# Patient Record
Sex: Male | Born: 1945
Health system: Southern US, Community
[De-identification: ages and names within clinical notes are randomized; demographics above are authoritative.]

## PROBLEM LIST (undated history)

## (undated) DIAGNOSIS — E785 Hyperlipidemia, unspecified: Secondary | ICD-10-CM

## (undated) DIAGNOSIS — I10 Essential (primary) hypertension: Secondary | ICD-10-CM

## (undated) DIAGNOSIS — G4730 Sleep apnea, unspecified: Secondary | ICD-10-CM

## (undated) DIAGNOSIS — E119 Type 2 diabetes mellitus without complications: Secondary | ICD-10-CM

## (undated) DIAGNOSIS — C801 Malignant (primary) neoplasm, unspecified: Secondary | ICD-10-CM

## (undated) DIAGNOSIS — K219 Gastro-esophageal reflux disease without esophagitis: Secondary | ICD-10-CM

## (undated) HISTORY — DX: Type 2 diabetes mellitus without complications: E11.9

## (undated) HISTORY — PX: TONSILLECTOMY: SUR1361

## (undated) HISTORY — DX: Gastro-esophageal reflux disease without esophagitis: K21.9

## (undated) HISTORY — DX: Essential (primary) hypertension: I10

## (undated) HISTORY — PX: OTHER SURGICAL HISTORY: SHX169

## (undated) HISTORY — PX: SHOULDER SURGERY: SHX246

## (undated) HISTORY — DX: Sleep apnea, unspecified: G47.30

## (undated) HISTORY — DX: Malignant (primary) neoplasm, unspecified: C80.1

## (undated) HISTORY — DX: Hyperlipidemia, unspecified: E78.5

---

## 2005-01-07 ENCOUNTER — Ambulatory Visit: Payer: Self-pay | Admitting: Internal Medicine

## 2005-02-18 ENCOUNTER — Ambulatory Visit: Payer: Self-pay | Admitting: Internal Medicine

## 2009-09-28 ENCOUNTER — Ambulatory Visit: Payer: Self-pay | Admitting: Radiology

## 2009-09-28 ENCOUNTER — Emergency Department (HOSPITAL_BASED_OUTPATIENT_CLINIC_OR_DEPARTMENT_OTHER): Admission: EM | Admit: 2009-09-28 | Discharge: 2009-09-29 | Payer: Self-pay | Admitting: Emergency Medicine

## 2010-07-28 LAB — BASIC METABOLIC PANEL
BUN: 19 mg/dL (ref 6–23)
Chloride: 105 mEq/L (ref 96–112)
GFR calc Af Amer: >60 mL/min (ref >60)
GFR calc non Af Amer: >60 mL/min (ref >60)
Glucose, Bld: 174 mg/dL — ABNORMAL HIGH (ref 70–99)

## 2010-07-28 LAB — CBC
HCT: 43.8 % (ref 39.0–52.0)
Hemoglobin: 14.9 g/dL (ref 13.0–17.0)
Platelets: 152 10*3/uL (ref 150–400)
RBC: 4.74 MIL/uL (ref 4.22–5.81)
RDW: 13 % (ref 11.5–15.5)

## 2010-07-28 LAB — DIFFERENTIAL
Basophils Relative: 1 % (ref 0–1)
Eosinophils Absolute: 0.2 10*3/uL (ref 0.0–0.7)
Lymphocytes Relative: 24 % (ref 12–46)
Neutro Abs: 5.2 10*3/uL (ref 1.7–7.7)
Neutrophils Relative %: 66 % (ref 43–77)

## 2013-05-11 LAB — HM COLONOSCOPY

## 2014-05-29 ENCOUNTER — Other Ambulatory Visit: Payer: Self-pay | Admitting: *Deleted

## 2014-05-29 ENCOUNTER — Encounter: Payer: Self-pay | Admitting: Vascular Surgery

## 2014-05-29 DIAGNOSIS — M79606 Pain in leg, unspecified: Secondary | ICD-10-CM

## 2014-05-30 ENCOUNTER — Encounter: Payer: Self-pay | Admitting: Vascular Surgery

## 2014-05-31 ENCOUNTER — Encounter: Payer: Self-pay | Admitting: Vascular Surgery

## 2014-05-31 ENCOUNTER — Ambulatory Visit (INDEPENDENT_AMBULATORY_CARE_PROVIDER_SITE_OTHER): Payer: Medicare HMO | Admitting: Vascular Surgery

## 2014-05-31 ENCOUNTER — Ambulatory Visit (HOSPITAL_COMMUNITY)
Admission: RE | Admit: 2014-05-31 | Discharge: 2014-05-31 | Disposition: A | Discharge status: Home / Self Care | Payer: Medicare HMO | Source: Ambulatory Visit | Attending: Vascular Surgery | Admitting: Vascular Surgery

## 2014-05-31 VITALS — BP 136/84 | HR 101 | Resp 16 | Ht >= 80 in | Wt 292.0 lb

## 2014-05-31 DIAGNOSIS — M79606 Pain in leg, unspecified: Secondary | ICD-10-CM

## 2014-05-31 DIAGNOSIS — I872 Venous insufficiency (chronic) (peripheral): Secondary | ICD-10-CM | POA: Insufficient documentation

## 2014-05-31 NOTE — Progress Notes (Signed)
Referred by:  Harvie Junior, MD [address] [place], [place] [address]  Reason for referral: B calf cramping  History of Present Illness  Ronald Barr is a 69 y.o. (1946-01-15) male who presents with chief complaint: B calf cramping.  Patient notes, onset of nocturnal calf cramping years ago.  He denies any associated sx routinely.  He has previously had cramping with laying flat and sitting at a desk.   He denies any leg swelling, intermittent claudication or rest pain.  He does not episodes with also anterior shin burning in the L lower leg.  The patient has had no history of DVT, known history of varicose vein, no history of venous stasis ulcers, no history of  Lymphedema and no history of skin changes in lower legs.  There is family history of venous disorders.  The patient has used OTC compression stockings in the past.  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  Past Medical History  Diagnosis Date  . Diabetes mellitus without complication   . GERD (gastroesophageal reflux disease)   . Hypertension   . Hyperlipidemia   . Cancer     prostate   Family History: denies any active medical issues in parents  History   Social History  . Marital Status: Married    Spouse Name: N/A    Number of Children: N/A  . Years of Education: N/A   Occupational History  . Not on file.   Social History Main Topics  . Smoking status: Former Smoker    Quit date: 06/01/1979  . Smokeless tobacco: Never Used  . Alcohol Use: 0.0 oz/week    0 Not specified per week  . Drug Use: No  . Sexual Activity: Not on file   Other Topics Concern  . Not on file   Social History Narrative    Family History  Problem Relation Age of Onset  . Hypertension Mother   . Hypertension Father     Current Outpatient Prescriptions  Medication Sig Dispense Refill  . aspirin 81 MG tablet Take 81 mg by mouth daily.    Marland Kitchen CINNAMON PO Take by mouth.    . co-enzyme Q-10 30 MG capsule  Take 30 mg by mouth 3 (three) times daily.    Marland Kitchen KRILL OIL PO Take by mouth.    Marland Kitchen LISINOPRIL PO Take by mouth daily.    Marland Kitchen LOSARTAN POTASSIUM PO Take by mouth daily.    Marland Kitchen METFORMIN HCL PO Take by mouth 3 (three) times daily.    Marland Kitchen OMEPRAZOLE PO Take by mouth daily.     No current facility-administered medications for this visit.    Allergies  Allergen Reactions  . Sulfa Antibiotics     REVIEW OF SYSTEMS:  (Positives checked otherwise negative)  CARDIOVASCULAR:  []  chest pain, []  chest pressure, []  palpitations, []  shortness of breath when laying flat, []  shortness of breath with exertion,  []  pain in feet when walking, []  pain in feet when laying flat, []  history of blood clot in veins (DVT), []  history of phlebitis, []  swelling in legs, []  varicose veins  PULMONARY:  []  productive cough, []  asthma, []  wheezing  NEUROLOGIC:  []  weakness in arms or legs, []  numbness in arms or legs, []  difficulty speaking or slurred speech, []  temporary loss of vision in one eye, []  dizziness  HEMATOLOGIC:  []  bleeding problems, []  problems with blood clotting too easily  MUSCULOSKEL:  []  joint pain, []  joint swelling, [x]   bilateral calf cramping  GASTROINTEST:  []  vomiting blood, []  blood in stool     GENITOURINARY:  []  burning with urination, []  blood in urine  PSYCHIATRIC:  []  history of major depression  INTEGUMENTARY:  []  rashes, []  ulcers  CONSTITUTIONAL:  []  fever, []  chills   Physical Examination Filed Vitals:   05/31/14 1337  BP: 136/84  Pulse: 101  Resp: 16  Height: 6\' 8"  (2.032 m)  Weight: 292 lb (132.45 kg)   Body mass index is 32.08 kg/(m^2).  General: A&O x 3, WDWN  Head: Centre/AT  Ear/Nose/Throat: Hearing grossly intact, nares w/o erythema or drainage, oropharynx w Erythema w/oExudate  Eyes: PERRLA, EOMI  Neck: Supple, no nuchal rigidity, no palpable LAD, mild submandibular gland enlargement  Pulmonary: Sym exp, good air movt, CTAB, no rales, rhonchi, &  wheezing  Cardiac: RRR, Nl S1, S2, no Murmurs, rubs or gallops  Vascular: Vessel Right Left  Radial Palpable Palpable  Brachial Palpable Palpable  Carotid Palpable, without bruit Palpable, without bruit  Aorta Not palpable N/A  Femoral Palpable Palpable  Popliteal Not palpable Not palpable  PT Not Palpable Not Palpable  DP Faintly Palpable Palpable   Gastrointestinal: soft, NTND, -G/R, - HSM, - masses, - CVAT B  Musculoskeletal: M/S 5/5 throughout , Extremities without ischemic changes , spider vein and small varicosities B, no edema B, no LDS  Neurologic: CN 2-12 intact , Pain and light touch intact in extremities , Motor exam as listed above  Psychiatric: Judgment intact, Mood & affect appropriate for pt's clinical situation  Dermatologic: See M/S exam for extremity exam, no rashes otherwise noted  Lymph : No Cervical, Axillary, or Inguinal lymphadenopathy    Non-Invasive Vascular Imaging  BLE Venous Insufficiency Duplex (Date: 05/31/2014):   RLE: no  DVT and SVT, + GSV reflux, + deep venous reflux: CFV  LLE: no DVT and SVT, + GSV reflux: minimal, + deep venous reflux: CFV, FV  Medical Decision Making  Ronald Barr is a 69 y.o. male who presents with: BLE chronic venous insufficiency (C2), nocturnal calf cramping   Pt's sx are not consistent with an arterial or venous etiology.  Based on the patient's history and examination, I recommend: OTC compressive therapy.  I discussed with the patient the use of her 20-30 mm thigh high compression stockings.  I doubt that this patient would benefit for EVLA of R GSV as his sx are not c/w with CVI.  Thank you for allowing [place] to participate in this patient's care.  Adele Barthel, MD [facility] Office: [phone] Pager: [phone]  05/31/2014, 2:00 PM

## 2014-07-10 DIAGNOSIS — M1712 Unilateral primary osteoarthritis, left knee: Secondary | ICD-10-CM | POA: Insufficient documentation

## 2014-07-10 DIAGNOSIS — M7062 Trochanteric bursitis, left hip: Secondary | ICD-10-CM | POA: Insufficient documentation

## 2015-03-14 DIAGNOSIS — H524 Presbyopia: Secondary | ICD-10-CM | POA: Diagnosis not present

## 2015-05-22 DIAGNOSIS — H43813 Vitreous degeneration, bilateral: Secondary | ICD-10-CM | POA: Diagnosis not present

## 2015-05-22 DIAGNOSIS — H35341 Macular cyst, hole, or pseudohole, right eye: Secondary | ICD-10-CM | POA: Diagnosis not present

## 2015-06-05 DIAGNOSIS — H47322 Drusen of optic disc, left eye: Secondary | ICD-10-CM | POA: Diagnosis not present

## 2015-06-05 DIAGNOSIS — H35373 Puckering of macula, bilateral: Secondary | ICD-10-CM | POA: Diagnosis not present

## 2015-06-05 DIAGNOSIS — H43811 Vitreous degeneration, right eye: Secondary | ICD-10-CM | POA: Diagnosis not present

## 2015-06-05 DIAGNOSIS — H47321 Drusen of optic disc, right eye: Secondary | ICD-10-CM | POA: Diagnosis not present

## 2015-06-05 DIAGNOSIS — H43311 Vitreous membranes and strands, right eye: Secondary | ICD-10-CM | POA: Diagnosis not present

## 2015-06-05 DIAGNOSIS — H25813 Combined forms of age-related cataract, bilateral: Secondary | ICD-10-CM | POA: Diagnosis not present

## 2015-07-12 DIAGNOSIS — Z23 Encounter for immunization: Secondary | ICD-10-CM | POA: Diagnosis not present

## 2015-07-12 DIAGNOSIS — N400 Enlarged prostate without lower urinary tract symptoms: Secondary | ICD-10-CM | POA: Diagnosis not present

## 2015-07-12 DIAGNOSIS — E785 Hyperlipidemia, unspecified: Secondary | ICD-10-CM | POA: Diagnosis not present

## 2015-07-12 DIAGNOSIS — R5383 Other fatigue: Secondary | ICD-10-CM | POA: Diagnosis not present

## 2015-07-12 DIAGNOSIS — E119 Type 2 diabetes mellitus without complications: Secondary | ICD-10-CM | POA: Diagnosis not present

## 2015-07-12 DIAGNOSIS — I10 Essential (primary) hypertension: Secondary | ICD-10-CM | POA: Diagnosis not present

## 2015-07-12 DIAGNOSIS — E669 Obesity, unspecified: Secondary | ICD-10-CM | POA: Diagnosis not present

## 2015-11-01 DIAGNOSIS — E119 Type 2 diabetes mellitus without complications: Secondary | ICD-10-CM | POA: Diagnosis not present

## 2016-03-06 ENCOUNTER — Other Ambulatory Visit: Payer: Self-pay

## 2016-03-06 ENCOUNTER — Other Ambulatory Visit: Payer: Self-pay | Admitting: Family Medicine

## 2016-03-06 ENCOUNTER — Ambulatory Visit (INDEPENDENT_AMBULATORY_CARE_PROVIDER_SITE_OTHER): Payer: Medicare HMO | Admitting: Family Medicine

## 2016-03-06 ENCOUNTER — Encounter: Payer: Self-pay | Admitting: Family Medicine

## 2016-03-06 VITALS — BP 100/76 | HR 81 | Temp 97.5°F | Ht >= 80 in | Wt 281.6 lb

## 2016-03-06 DIAGNOSIS — I10 Essential (primary) hypertension: Secondary | ICD-10-CM

## 2016-03-06 DIAGNOSIS — K219 Gastro-esophageal reflux disease without esophagitis: Secondary | ICD-10-CM | POA: Diagnosis not present

## 2016-03-06 DIAGNOSIS — Z1159 Encounter for screening for other viral diseases: Secondary | ICD-10-CM | POA: Diagnosis not present

## 2016-03-06 DIAGNOSIS — E119 Type 2 diabetes mellitus without complications: Secondary | ICD-10-CM | POA: Diagnosis not present

## 2016-03-06 DIAGNOSIS — N179 Acute kidney failure, unspecified: Secondary | ICD-10-CM

## 2016-03-06 DIAGNOSIS — Z23 Encounter for immunization: Secondary | ICD-10-CM

## 2016-03-06 HISTORY — DX: Essential (primary) hypertension: I10

## 2016-03-06 LAB — COMPREHENSIVE METABOLIC PANEL
ALK PHOS: 28 U/L — AB (ref 39–117)
ALT: 28 U/L (ref 0–53)
AST: 26 U/L (ref 0–37)
Albumin: 4.4 g/dL (ref 3.5–5.2)
BILIRUBIN TOTAL: 0.5 mg/dL (ref 0.2–1.2)
BUN: 42 mg/dL — ABNORMAL HIGH (ref 6–23)
CALCIUM: 10 mg/dL (ref 8.4–10.5)
CO2: 26 mEq/L (ref 19–32)
Chloride: 104 mEq/L (ref 96–112)
Creatinine, Ser: 2.29 mg/dL — ABNORMAL HIGH (ref 0.40–1.50)
GFR: 30.26 mL/min — AB (ref >60.00)
GLUCOSE: 180 mg/dL — AB (ref 70–99)
POTASSIUM: 4.9 meq/L (ref 3.5–5.1)
Sodium: 137 mEq/L (ref 135–145)
TOTAL PROTEIN: 7.5 g/dL (ref 6.0–8.3)

## 2016-03-06 LAB — LIPID PANEL
CHOL/HDL RATIO: 5
Cholesterol: 204 mg/dL — ABNORMAL HIGH (ref 0–200)
HDL: 38.8 mg/dL — AB (ref >39.00)
LDL Cholesterol: 142 mg/dL — ABNORMAL HIGH (ref 0–99)
NONHDL: 165.15
TRIGLYCERIDES: 118 mg/dL (ref 0.0–149.0)
VLDL: 23.6 mg/dL (ref 0.0–40.0)

## 2016-03-06 LAB — HEPATITIS C ANTIBODY: HCV AB: NEGATIVE

## 2016-03-06 LAB — HEMOGLOBIN A1C: Hgb A1c MFr Bld: 8.1 % — ABNORMAL HIGH (ref 4.6–6.5)

## 2016-03-06 MED ORDER — INVOKANA 100 MG PO TABS: 100.0000 mg | ORAL_TABLET | Freq: Every day | ORAL | 1 refills | Status: DC

## 2016-03-06 MED ORDER — LISINOPRIL-HYDROCHLOROTHIAZIDE 20-25 MG PO TABS: 1.0000 | ORAL_TABLET | Freq: Every day | ORAL | 1 refills | Status: DC

## 2016-03-06 MED ORDER — FENOFIBRATE 145 MG PO TABS: 145.0000 mg | ORAL_TABLET | Freq: Every day | ORAL | 1 refills | Status: DC

## 2016-03-06 MED ORDER — METFORMIN HCL 850 MG PO TABS: 850.0000 mg | ORAL_TABLET | Freq: Three times a day (TID) | ORAL | 1 refills | Status: DC

## 2016-03-06 MED ORDER — OMEPRAZOLE 20 MG PO CPDR: 20.0000 mg | DELAYED_RELEASE_CAPSULE | Freq: Every day | ORAL | 1 refills | Status: DC

## 2016-03-06 NOTE — Addendum Note (Signed)
Addended by: [place] on: 03/06/2016 09:23 AM   Modules accepted: Orders

## 2016-03-06 NOTE — Patient Instructions (Addendum)
Stop taking the Losartan and Glipizide.  Try Pepcid or Zantac instead of your Omeprazole. If your symptoms return, go back to the omeprazole.

## 2016-03-06 NOTE — Progress Notes (Signed)
Pre visit review using our clinic review tool, if applicable. No additional management support is needed unless otherwise documented below in the visit note. 

## 2016-03-06 NOTE — Progress Notes (Signed)
Chief Complaint  Patient presents with  . Establish Care    Rx refilled PCP has closed office with no warning        New Patient Visit SUBJECTIVE: HPI: Ronald Barr is an 70 y.o.male who is being seen for establishing care.  The patient was previously seen at [facility], [facility], whose office closed down without warning.  [facility] Has had both PCV's Checks sugars 2 times weekly. Does have intermittent low sugar levels. 1x/2 weeks. Does not require insulin. He is on Metformin, Glucotrol, and Invokana. He does take a baby asa daily. He is taking both Prinzide and Losartan. He tried multiple statins in the past, but was intolerant 2/2 muscle aches. He is now on Tricor. Nml microalbumin/Cr 6 mo ago, when he had his other routine labs done.  He does check his feet routinely and has no issues. Flu shot today.  Allergies  Allergen Reactions  . Sulfa Antibiotics     Past Medical History:  Diagnosis Date  . Cancer [facility])    prostate  . Diabetes mellitus without complication ([place])   . Essential hypertension 03/06/2016  . GERD (gastroesophageal reflux disease)   . Hyperlipidemia   . Hypertension   . Sleep apnea    sleeps with BPAP machine   Past Surgical History:  Procedure Laterality Date  . nose and throat surgery     sleep apnes  . SHOULDER SURGERY    . TONSILLECTOMY     Social History   Social History  . Marital status: Married   Social History Main Topics  . Smoking status: Former Smoker    Quit date: 06/01/1979  . Smokeless tobacco: Never Used  . Alcohol use 0.0 oz/week  . Drug use: No   Family History  Problem Relation Age of Onset  . Hypertension Mother   . Hypertension Father      Current Outpatient Prescriptions:  .  aspirin 81 MG tablet, Take 81 mg by mouth daily., Disp: , Rfl:  .  CINNAMON PO, Take 500 mg by mouth 2 (two) times daily. , Disp: , Rfl:  .  diphenhydrAMINE (BENADRYL) 25 MG tablet, Take 25 mg by  mouth every 6 (six) hours as needed., Disp: , Rfl:  .  fenofibrate (TRICOR) 145 MG tablet, Take 1 tablet (145 mg total) by mouth daily., Disp: 90 tablet, Rfl: 1 .  INVOKANA 100 MG TABS tablet, Take 1 tablet (100 mg total) by mouth daily before breakfast., Disp: 90 tablet, Rfl: 1 .  KRILL OIL PO, Take by mouth., Disp: , Rfl:  .  magnesium gluconate (MAGONATE) 500 MG tablet, Take 500 mg by mouth 2 (two) times daily., Disp: , Rfl:  .  metFORMIN (GLUCOPHAGE) 850 MG tablet, Take 1 tablet (850 mg total) by mouth 3 (three) times daily., Disp: 270 tablet, Rfl: 1 .  omeprazole (PRILOSEC) 20 MG capsule, Take 1 capsule (20 mg total) by mouth daily., Disp: 90 capsule, Rfl: 1 .  Potassium 99 MG TABS, Take 99 mg by mouth 2 (two) times daily., Disp: , Rfl:  .  co-enzyme Q-10 30 MG capsule, Take 30 mg by mouth 3 (three) times daily., Disp: , Rfl:  .  lisinopril-hydrochlorothiazide (PRINZIDE,ZESTORETIC) 20-25 MG tablet, Take 1 tablet by mouth daily., Disp: 90 tablet, Rfl: 1  ROS Cardiovascular: Denies chest pain  Respiratory: Denies dyspnea   OBJECTIVE: BP 100/76 (BP Location: Left Arm, Patient Position: Sitting, Cuff Size: Large)   Pulse 81  Temp 97.5 F (36.4 C) (Oral)   Ht 6\' 8"  (2.032 m)   Wt 281 lb 9.6 oz (127.7 kg)   SpO2 95%   BMI 30.94 kg/m   Constitutional: -  VS reviewed -  Well developed, well nourished, appears stated age -  No apparent distress  Psychiatric: -  Oriented to person, place, and time -  Memory intact -  Affect and mood normal -  Fluent conversation, good eye contact -  Judgment and insight age appropriate  Eye: -  Conjunctivae clear, no discharge -  Pupils symmetric, round, reactive to light  ENMT: -  Oral mucosa without lesions, tongue and uvula midline    Tonsils not enlarged, no erythema, no exudate, trachea midline    Pharynx moist, no lesions, no erythema  Neck: -  No gross swelling, no palpable masses -  Thyroid midline, not enlarged, mobile, no palpable masses   Cardiovascular: -  RRR, no murmurs -  No LE edema  Respiratory: -  Normal respiratory effort, no accessory muscle use, no retraction -  Breath sounds equal, no wheezes, no ronchi, no crackles  Gastrointestinal: -  Bowel sounds normal -  No tenderness, no distention, no guarding, no masses  Musculoskeletal: -  No clubbing, no cyanosis -  Gait normal  Skin: -  No significant lesion on inspection -  Warm and dry to palpation   ASSESSMENT/PLAN: Type 2 diabetes mellitus without complication, without long-term current use of insulin (HCC) - Plan: INVOKANA 100 MG TABS tablet, fenofibrate (TRICOR) 145 MG tablet, metFORMIN (GLUCOPHAGE) 850 MG tablet, lisinopril-hydrochlorothiazide (PRINZIDE,ZESTORETIC) 20-25 MG tablet, Lipid panel, Hemoglobin A1c, CANCELED: Microalbumin / creatinine urine ratio  Essential hypertension - Plan: lisinopril-hydrochlorothiazide (PRINZIDE,ZESTORETIC) 20-25 MG tablet, Comprehensive metabolic panel  Gastroesophageal reflux disease, esophagitis presence not specified - Plan: omeprazole (PRILOSEC) 20 MG capsule  Need for hepatitis C screening test - Plan: Hepatitis C Antibody  Patient instructed to sign release of records form from his previous PCP. Stop Losartan as he is on an ACEi. Stop Glucotrol given his episodes of hypoglycemia and age. Will trial off of omeprazole, recommended Pepcid or Zantac. If too  Patient should return in 3 mo to recheck DMII. If his A1c is controlled, will space out to every 6 mo. The patient voiced understanding and agreement to the plan.   [place], DO 03/06/16  9:06 AM

## 2016-03-12 ENCOUNTER — Other Ambulatory Visit (INDEPENDENT_AMBULATORY_CARE_PROVIDER_SITE_OTHER): Payer: Medicare HMO

## 2016-03-12 DIAGNOSIS — N179 Acute kidney failure, unspecified: Secondary | ICD-10-CM

## 2016-03-12 LAB — BASIC METABOLIC PANEL
BUN: 43 mg/dL — AB (ref 6–23)
CO2: 25 meq/L (ref 19–32)
Calcium: 10.1 mg/dL (ref 8.4–10.5)
Chloride: 100 mEq/L (ref 96–112)
Creatinine, Ser: 2.24 mg/dL — ABNORMAL HIGH (ref 0.40–1.50)
GFR: 31.04 mL/min — ABNORMAL LOW (ref >60.00)
GLUCOSE: 212 mg/dL — AB (ref 70–99)
POTASSIUM: 5.4 meq/L — AB (ref 3.5–5.1)
SODIUM: 133 meq/L — AB (ref 135–145)

## 2016-03-13 ENCOUNTER — Telehealth: Payer: Self-pay | Admitting: Family

## 2016-03-13 MED ORDER — AMLODIPINE BESYLATE 5 MG PO TABS: 5.0000 mg | ORAL_TABLET | Freq: Every day | ORAL | 2 refills | Status: DC

## 2016-03-13 MED ORDER — HYDROCHLOROTHIAZIDE 25 MG PO TABS: 25.0000 mg | ORAL_TABLET | Freq: Every day | ORAL | 2 refills | Status: DC

## 2016-03-13 NOTE — Telephone Encounter (Signed)
Pt called back and was notified of below recommendation. Pt states that he was told to stop lisinopril/hctz at last office visit and to continue his losartan. Pt states he is only taking losartan for his BP and not using any NSAIDS. Per verbal from NP, O'sullivan, stop losartan and start amlodipine 5mg  once a day and follow up with PCP in 1 week for BP and lab recheck. Scheduled appt for 03/20/16 at 7am. HCTZ removed from med list. Losartan was already off the med list. Amlodipine Rx sent. Cancelled losartan rx with Mickel Baas at [facility].

## 2016-03-13 NOTE — Telephone Encounter (Signed)
Left message for pt to return my call.

## 2016-03-13 NOTE — Telephone Encounter (Signed)
Covering for Dr. Nani Ravens- please let patient know that his kidney function remains impaired. Unchanged since previous check 1 week ago.   Is he using any NSAIDS?  If so, needs to d/c nsaids.  Also, potassium is elevated.  D/c potassium supplement.  I would also like him to stop lisinopril hctz. Instead, begin hctz 25mg  once daily once daily and amlodipine 5mg  once daily.  Follow up with Dr. Nani Ravens in 1 week for BP recheck and follow up lab work.

## 2016-03-20 ENCOUNTER — Ambulatory Visit (INDEPENDENT_AMBULATORY_CARE_PROVIDER_SITE_OTHER): Payer: Medicare HMO | Admitting: Family Medicine

## 2016-03-20 ENCOUNTER — Encounter: Payer: Self-pay | Admitting: Family Medicine

## 2016-03-20 VITALS — BP 122/62 | HR 82 | Temp 97.7°F | Ht >= 80 in | Wt 278.2 lb

## 2016-03-20 DIAGNOSIS — E119 Type 2 diabetes mellitus without complications: Secondary | ICD-10-CM | POA: Diagnosis not present

## 2016-03-20 DIAGNOSIS — R7989 Other specified abnormal findings of blood chemistry: Secondary | ICD-10-CM

## 2016-03-20 DIAGNOSIS — I10 Essential (primary) hypertension: Secondary | ICD-10-CM | POA: Diagnosis not present

## 2016-03-20 DIAGNOSIS — R252 Cramp and spasm: Secondary | ICD-10-CM

## 2016-03-20 LAB — BASIC METABOLIC PANEL
BUN: 26 mg/dL — ABNORMAL HIGH (ref 6–23)
CHLORIDE: 103 meq/L (ref 96–112)
CO2: 28 mEq/L (ref 19–32)
Calcium: 10.3 mg/dL (ref 8.4–10.5)
Creatinine, Ser: 1.91 mg/dL — ABNORMAL HIGH (ref 0.40–1.50)
GFR: 37.31 mL/min — AB (ref >60.00)
Glucose, Bld: 216 mg/dL — ABNORMAL HIGH (ref 70–99)
POTASSIUM: 4.4 meq/L (ref 3.5–5.1)
SODIUM: 141 meq/L (ref 135–145)

## 2016-03-20 LAB — IBC PANEL
IRON: 91 ug/dL (ref 42–165)
SATURATION RATIOS: 19.9 % — AB (ref 20.0–50.0)
TRANSFERRIN: 326 mg/dL (ref 212.0–360.0)

## 2016-03-20 LAB — FERRITIN: FERRITIN: 36.3 ng/mL (ref 22.0–322.0)

## 2016-03-20 LAB — MICROALBUMIN / CREATININE URINE RATIO
Creatinine,U: 178.7 mg/dL
MICROALB UR: 1.4 mg/dL (ref 0.0–1.9)
Microalb Creat Ratio: 0.8 mg/g (ref 0.0–30.0)

## 2016-03-20 MED ORDER — SITAGLIPTIN PHOSPHATE 50 MG PO TABS: 50.0000 mg | ORAL_TABLET | Freq: Every day | ORAL | 1 refills | Status: DC

## 2016-03-20 NOTE — Progress Notes (Signed)
Chief Complaint  Patient presents with  . Follow-up    BP/labs    Subjective Ronald Barr is a 70 y.o. male who presents for hypertension follow up. He does not monitor home blood pressures. Patient has these side effects of medication: none He is adhering to a low sodium and low fat diet. Current exercise: none  He had been on both Prinzide and Losartan for 7-8 years. I had stopped his LosartanCr was 1.0 in 2011, 2.4 in recent weeks.  Leg cramps Was taking K for leg cramps at night. He already takes magnesium and tried various remedies with no relief. His PO intake of liquids is unchanged and he states it is good.  DM Had balanitis after starting the Invokana. Checks his sugars intermittently.   Past Medical History:  Diagnosis Date  . Cancer [facility])    prostate  . Diabetes mellitus without complication ([place])   . Essential hypertension 03/06/2016  . GERD (gastroesophageal reflux disease)   . Hyperlipidemia   . Sleep apnea    sleeps with BPAP machine   Family History  Problem Relation Age of Onset  . Hypertension Mother   . Hypertension Father      Medications Current Outpatient Prescriptions on File Prior to Visit  Medication Sig Dispense Refill  . amLODipine (NORVASC) 5 MG tablet Take 1 tablet (5 mg total) by mouth daily. 30 tablet 2  . aspirin 81 MG tablet Take 81 mg by mouth daily.    Marland Kitchen CINNAMON PO Take 500 mg by mouth 2 (two) times daily.     . diphenhydrAMINE (BENADRYL) 25 MG tablet Take 25 mg by mouth every 6 (six) hours as needed.    . fenofibrate (TRICOR) 145 MG tablet Take 1 tablet (145 mg total) by mouth daily. 90 tablet 1  . INVOKANA 100 MG TABS tablet Take 1 tablet (100 mg total) by mouth daily before breakfast. 90 tablet 1  . KRILL OIL PO Take by mouth.    . magnesium gluconate (MAGONATE) 500 MG tablet Take 500 mg by mouth 2 (two) times daily.    . metFORMIN (GLUCOPHAGE) 850 MG tablet Take 1 tablet (850 mg total) by mouth 3 (three) times daily. 270  tablet 1  . omeprazole (PRILOSEC) 20 MG capsule Take 1 capsule (20 mg total) by mouth daily. 90 capsule 1  . co-enzyme Q-10 30 MG capsule Take 30 mg by mouth 3 (three) times daily.     Allergies Allergies  Allergen Reactions  . Sulfa Antibiotics     Review of Systems Eye:  no recent significant change in vision Cardiovascular:  no exercise intolerance, no chest pain, no palpitations Respiratory:  no cough or shortness of breath Neurologic:  no chronic headaches, numbness or tingling  Exam BP 122/62 (BP Location: Left Arm, Patient Position: Sitting, Cuff Size: Large)   Pulse 82   Temp 97.7 F (36.5 C) (Oral)   Ht 6\' 8"  (2.032 m)   Wt 278 lb 3.2 oz (126.2 kg)   SpO2 93%   BMI 30.56 kg/m  General:  well developed, well nourished, in no apparent distress Skin:  warm, no pallor or diaphoresis Eyes:  pupils equal and round, sclera anicteric without injection Mouth: MMM, uvula surgically absent Neck: neck supple without adenopathy, thyromegaly, masses, or bruits  Lungs:  clear to auscultation, breath sounds equal bilaterally Cardio:  regular rate and rhythm without murmurs, heart sounds without clicks or rubs Musculoskeletal:  symmetrical muscle groups noted without atrophy or deformity, gait normal  Extremities:  no clubbing, cyanosis, or edema, no deformities, no skin discoloration Psych: well oriented with normal range of affect and appropriate judgment/insight  Essential hypertension  Elevated serum creatinine - Plan: Basic Metabolic Panel (BMET), Microalbumin / creatinine urine ratio  Leg cramping - Plan: Ferritin, IBC panel  Type 2 diabetes mellitus without complication, without long-term current use of insulin (HCC) - Plan: sitaGLIPtin (JANUVIA) 50 MG tablet, Microalbumin / creatinine urine ratio  Orders as above. He may benefit from the reno-protective effects of an ACEi/ARB given his GFR. There is questionable data supporting removing these in setting of AKI and we  don't know his baseline, so I will keep things as are for now. If he has significant proteinuria, will consider restarting these vs referring to nephrology. Avoid NSAIDs and other nephrotoxins when able. Will stop Invokana and start a lower dose Januvia.  Check Fe deficiency for leg cramping.  F/u in 1 mo. The patient voiced understanding and agreement to the plan.  [place], DO 03/20/16  7:42 AM

## 2016-03-20 NOTE — Progress Notes (Signed)
Pre visit review using our clinic review tool, if applicable. No additional management support is needed unless otherwise documented below in the visit note. 

## 2016-03-20 NOTE — Patient Instructions (Signed)
Stop the Losartan, prinzide and Invokana.  A new medicine for your diabetes has been called in (Januvia).  I will see you in 4 weeks.

## 2016-04-17 ENCOUNTER — Ambulatory Visit (INDEPENDENT_AMBULATORY_CARE_PROVIDER_SITE_OTHER): Payer: Medicare HMO | Admitting: Family Medicine

## 2016-04-17 ENCOUNTER — Encounter: Payer: Self-pay | Admitting: Family Medicine

## 2016-04-17 VITALS — BP 148/76 | HR 90 | Temp 97.7°F | Ht >= 80 in | Wt 282.8 lb

## 2016-04-17 DIAGNOSIS — I10 Essential (primary) hypertension: Secondary | ICD-10-CM | POA: Diagnosis not present

## 2016-04-17 DIAGNOSIS — E1165 Type 2 diabetes mellitus with hyperglycemia: Secondary | ICD-10-CM

## 2016-04-17 DIAGNOSIS — IMO0001 Reserved for inherently not codable concepts without codable children: Secondary | ICD-10-CM

## 2016-04-17 DIAGNOSIS — IMO0002 Reserved for concepts with insufficient information to code with codable children: Secondary | ICD-10-CM | POA: Insufficient documentation

## 2016-04-17 LAB — COMPREHENSIVE METABOLIC PANEL
ALBUMIN: 4.2 g/dL (ref 3.5–5.2)
ALK PHOS: 32 U/L — AB (ref 39–117)
ALT: 29 U/L (ref 0–53)
AST: 31 U/L (ref 0–37)
BILIRUBIN TOTAL: 0.6 mg/dL (ref 0.2–1.2)
BUN: 23 mg/dL (ref 6–23)
CO2: 28 mEq/L (ref 19–32)
Calcium: 9.8 mg/dL (ref 8.4–10.5)
Chloride: 105 mEq/L (ref 96–112)
Creatinine, Ser: 1.54 mg/dL — ABNORMAL HIGH (ref 0.40–1.50)
GFR: 47.82 mL/min — ABNORMAL LOW (ref >60.00)
GLUCOSE: 274 mg/dL — AB (ref 70–99)
POTASSIUM: 4.1 meq/L (ref 3.5–5.1)
SODIUM: 141 meq/L (ref 135–145)
TOTAL PROTEIN: 7.1 g/dL (ref 6.0–8.3)

## 2016-04-17 LAB — HEMOGLOBIN A1C: HEMOGLOBIN A1C: 8.3 % — AB (ref 4.6–6.5)

## 2016-04-17 NOTE — Patient Instructions (Addendum)
I want your BP to be consistently lower than 140/90. Check it 3-4 times per week and write it down. Bring your log and your cuff to your nurse visit in 2 weeks.

## 2016-04-17 NOTE — Progress Notes (Signed)
Subjective:   Chief Complaint  Patient presents with  . Follow-up    HTN, renal function and DM    Ronald Barr is a 70 y.o. male here for follow-up of diabetes.   Quillan's self monitored glucose range is 140 in AM Patient denies hypoglycemic reactions. He checks his glucose levels 1 times per day. Patient does not require insulin.   Patient exercises 0 days per week on average.  Active at home Patient has had diabetic and nutritional education.   He does take an aspirin daily. Statin? No- intolerant ACEi/ARB? Yes   Hypertension Patient presents for hypertension follow up. He does not routinely  monitor home blood pressures. He is compliant with medications- Norvasc 5 mg daily. Patient has these side effects of medication: none He is adhering to a healthy diet overall. Exercise: rarely- active at home   Past Medical History:  Diagnosis Date  . Cancer [facility])    prostate  . Diabetes mellitus without complication ([place])   . Essential hypertension 03/06/2016  . GERD (gastroesophageal reflux disease)   . Hyperlipidemia   . Sleep apnea    sleeps with BPAP machine    Past Surgical History:  Procedure Laterality Date  . nose and throat surgery     sleep apnes  . SHOULDER SURGERY    . TONSILLECTOMY      Social History   Social History  . Marital status: Married   Social History Main Topics  . Smoking status: Former Smoker    Quit date: 06/01/1979  . Smokeless tobacco: Never Used  . Alcohol use 0.0 oz/week  . Drug use: No   Current Outpatient Prescriptions on File Prior to Visit  Medication Sig Dispense Refill  . amLODipine (NORVASC) 5 MG tablet Take 1 tablet (5 mg total) by mouth daily. 30 tablet 2  . aspirin 81 MG tablet Take 81 mg by mouth daily.    Marland Kitchen CINNAMON PO Take 500 mg by mouth 2 (two) times daily.     Marland Kitchen co-enzyme Q-10 30 MG capsule Take 30 mg by mouth 3 (three) times daily.    . diphenhydrAMINE (BENADRYL) 25 MG tablet Take 25 mg by mouth every 6 (six)  hours as needed.    . fenofibrate (TRICOR) 145 MG tablet Take 1 tablet (145 mg total) by mouth daily. 90 tablet 1  . KRILL OIL PO Take by mouth.    . magnesium gluconate (MAGONATE) 500 MG tablet Take 500 mg by mouth 2 (two) times daily.    . metFORMIN (GLUCOPHAGE) 850 MG tablet Take 1 tablet (850 mg total) by mouth 3 (three) times daily. 270 tablet 1  . omeprazole (PRILOSEC) 20 MG capsule Take 1 capsule (20 mg total) by mouth daily. 90 capsule 1  . sitaGLIPtin (JANUVIA) 50 MG tablet Take 1 tablet (50 mg total) by mouth daily. 30 tablet 1   Related testing: Foot exam(monofilament and inspection):done Retinal exam:done Date of retinal exam: scheduled soon Pneumovax: done Flu Shot: done  Review of Systems: Pulmonary:  No SOB Cardiovascular:  No chest pain  Objective:  BP (!) 148/76 (BP Location: Left Arm, Patient Position: Sitting, Cuff Size: Large)   Pulse 90   Temp 97.7 F (36.5 C) (Oral)   Ht 6\' 8"  (2.032 m)   Wt 282 lb 12.8 oz (128.3 kg)   SpO2 94%   BMI 31.07 kg/m  General:  Well developed, well nourished, in no apparent distress Skin:  Warm, no pallor or diaphoresis Head:  Normocephalic, atraumatic  Eyes:  Pupils equal and round, sclera anicteric without injection  Nose:  External nares without trauma, no discharge Throat/Pharynx:  Lips and gingiva without lesion Neck: Neck supple.  No obvious thyromegaly or masses.  No bruits Lungs:  clear to auscultation, breath sounds equal bilaterally, no wheezes, rales, or stridor Cardio:  regular rate and rhythm without murmurs Abdomen:  Abdomen soft, non-tender, BS normal Musculoskeletal:  Symmetrical muscle groups noted without atrophy or deformity Extremities:  No clubbing, cyanosis, or edema, no deformities, no skin discoloration Neuro:  Alert and oriented to person, place, and time.  Assessment:   Uncontrolled type 2 diabetes mellitus without complication, without long-term current use of insulin (HCC) - Plan: Hemoglobin  A1c  Essential hypertension - Plan: Comprehensive metabolic panel   Plan:   Orders as above. Hopefully renal function continues to improve. If so, we can place him back on lisinopril. When I first saw him, he was on both an ACE inhibitor and angiotensin receptor blocker- which I believe was the cause of his renal issue. F/u in 2 weeks for BP check, nurse visit. 3 mo for DM if controlled, 1 mo if uncontrolled. I would like him to start checking his blood pressure 3-4 times weekly, writing it down, and to bring his log and blood pressure cuff to his blood pressure check with the nurse. The patient voiced understanding and agreement to the plan.  [place], DO 04/17/16 11:41 AM

## 2016-04-17 NOTE — Progress Notes (Signed)
Pre visit review using our clinic review tool, if applicable. No additional management support is needed unless otherwise documented below in the visit note. 

## 2016-04-24 DIAGNOSIS — Z85828 Personal history of other malignant neoplasm of skin: Secondary | ICD-10-CM | POA: Diagnosis not present

## 2016-04-24 DIAGNOSIS — L820 Inflamed seborrheic keratosis: Secondary | ICD-10-CM | POA: Diagnosis not present

## 2016-04-24 DIAGNOSIS — D1801 Hemangioma of skin and subcutaneous tissue: Secondary | ICD-10-CM | POA: Diagnosis not present

## 2016-04-24 DIAGNOSIS — D2271 Melanocytic nevi of right lower limb, including hip: Secondary | ICD-10-CM | POA: Diagnosis not present

## 2016-04-24 DIAGNOSIS — L570 Actinic keratosis: Secondary | ICD-10-CM | POA: Diagnosis not present

## 2016-04-24 DIAGNOSIS — L814 Other melanin hyperpigmentation: Secondary | ICD-10-CM | POA: Diagnosis not present

## 2016-04-24 DIAGNOSIS — L821 Other seborrheic keratosis: Secondary | ICD-10-CM | POA: Diagnosis not present

## 2016-04-24 DIAGNOSIS — L853 Xerosis cutis: Secondary | ICD-10-CM | POA: Diagnosis not present

## 2016-04-24 DIAGNOSIS — D2272 Melanocytic nevi of left lower limb, including hip: Secondary | ICD-10-CM | POA: Diagnosis not present

## 2016-05-01 ENCOUNTER — Ambulatory Visit (INDEPENDENT_AMBULATORY_CARE_PROVIDER_SITE_OTHER): Payer: Medicare HMO | Admitting: Family Medicine

## 2016-05-01 VITALS — BP 144/86 | HR 74

## 2016-05-01 DIAGNOSIS — I10 Essential (primary) hypertension: Secondary | ICD-10-CM | POA: Diagnosis not present

## 2016-05-01 MED ORDER — AMLODIPINE BESYLATE 10 MG PO TABS: 10.0000 mg | ORAL_TABLET | Freq: Every day | ORAL | 2 refills | Status: DC

## 2016-05-01 NOTE — Progress Notes (Signed)
Pre visit review using our clinic review tool, if applicable. No additional management support is needed unless otherwise documented below in the visit note.  Patient came in clinic for blood pressure check per OV note 04/17/16. Reviewed current medication & regimen with the patient. He provided his home BP readings as well. Today's readings were as follow: BP 153/87 P 71 & BP 144/86 P 74.  Per Dr. Nani Ravens: Increase Amlodipine (Norvasc) to 10 MG daily. Continue current regimen with all other medications. Keep follow-up appointment with PCP on 05/21/16 at 9:15 AM.  Informed patient of the provider's instructions. He voiced understanding and did not have any further questions or concerns before leaving the nurse visit.

## 2016-05-01 NOTE — Progress Notes (Signed)
Noted. Agree with above. Will likely add back ACEi/ARB after AKI for renal protection if he is not improved.

## 2016-05-01 NOTE — Patient Instructions (Addendum)
Per Dr. Nani Ravens: Increase Amlodipine (Norvasc) to 10 MG daily. Continue current regimen with all other medications. Keep follow-up appointment with PCP on 05/21/16 at 9:15 AM.

## 2016-05-18 ENCOUNTER — Other Ambulatory Visit: Payer: Self-pay | Admitting: Family Medicine

## 2016-05-18 DIAGNOSIS — E119 Type 2 diabetes mellitus without complications: Secondary | ICD-10-CM

## 2016-05-18 NOTE — Telephone Encounter (Signed)
I have refilled Rx for Januvia. #90 tablets #0 refill  [place]  TL/CMA

## 2016-05-21 ENCOUNTER — Encounter: Payer: Self-pay | Admitting: Family Medicine

## 2016-05-21 ENCOUNTER — Ambulatory Visit (INDEPENDENT_AMBULATORY_CARE_PROVIDER_SITE_OTHER): Payer: Medicare HMO | Admitting: Family Medicine

## 2016-05-21 VITALS — BP 120/72 | HR 84 | Temp 97.9°F | Ht >= 80 in | Wt 278.4 lb

## 2016-05-21 DIAGNOSIS — M7061 Trochanteric bursitis, right hip: Secondary | ICD-10-CM

## 2016-05-21 DIAGNOSIS — E1165 Type 2 diabetes mellitus with hyperglycemia: Secondary | ICD-10-CM | POA: Diagnosis not present

## 2016-05-21 DIAGNOSIS — M7062 Trochanteric bursitis, left hip: Secondary | ICD-10-CM

## 2016-05-21 DIAGNOSIS — IMO0001 Reserved for inherently not codable concepts without codable children: Secondary | ICD-10-CM

## 2016-05-21 DIAGNOSIS — N179 Acute kidney failure, unspecified: Secondary | ICD-10-CM | POA: Diagnosis not present

## 2016-05-21 LAB — BASIC METABOLIC PANEL
BUN: 25 mg/dL — ABNORMAL HIGH (ref 6–23)
CO2: 28 mEq/L (ref 19–32)
Calcium: 10 mg/dL (ref 8.4–10.5)
Chloride: 101 mEq/L (ref 96–112)
Creatinine, Ser: 1.55 mg/dL — ABNORMAL HIGH (ref 0.40–1.50)
GFR: 47.45 mL/min — AB (ref >60.00)
Glucose, Bld: 345 mg/dL — ABNORMAL HIGH (ref 70–99)
POTASSIUM: 4.3 meq/L (ref 3.5–5.1)
SODIUM: 138 meq/L (ref 135–145)

## 2016-05-21 MED ORDER — LIDOCAINE HCL 1 % IJ SOLN
2.0000 mL | Freq: Once | INTRAMUSCULAR | Status: AC
  Administered 2016-05-21: 2 mL

## 2016-05-21 MED ORDER — SITAGLIPTIN PHOSPHATE 100 MG PO TABS: 100.0000 mg | ORAL_TABLET | Freq: Every day | ORAL | 1 refills | Status: DC

## 2016-05-21 MED ORDER — METHYLPREDNISOLONE ACETATE 40 MG/ML IJ SUSP
40.0000 mg | Freq: Once | INTRAMUSCULAR | Status: AC
  Administered 2016-05-21: 40 mg

## 2016-05-21 NOTE — Patient Instructions (Signed)
Let us know if you need anything.  Plan on your labs looking good unless you hear from [place].

## 2016-05-21 NOTE — Progress Notes (Signed)
Subjective:   Chief Complaint  Patient presents with  . Follow-up    on DM and BP    Ronald Barr is a 71 y.o. male here for follow-up of diabetes.   Ronald Barr's self monitored glucose range is in the 200's.  Patient denies hypoglycemic reactions. He checks his glucose levels 2-3 times per week. Patient does not require insulin.   Medications include: Metformin 1000 mg BID,  Patient exercises 0 days per week on average.   He does take an aspirin daily. Statin? No-intolerant; on Zetia ACEi/ARB? No, was on ACEi and ARB at initial visit and was found to be in renal failure.  B/l hip pain for many years has hampered his exercise. He was told by an old orthopod he has bursitis. He had an injection on the L side once, around 3-4 years ago that was helpful.   Past Medical History:  Diagnosis Date  . Cancer [facility])    prostate  . Diabetes mellitus without complication ([place])   . Essential hypertension 03/06/2016  . GERD (gastroesophageal reflux disease)   . Hyperlipidemia   . Sleep apnea    sleeps with BPAP machine    Past Surgical History:  Procedure Laterality Date  . nose and throat surgery     sleep apnes  . SHOULDER SURGERY    . TONSILLECTOMY      Social History   Social History  . Marital status: Married   Social History Main Topics  . Smoking status: Former Smoker    Quit date: 06/01/1979  . Smokeless tobacco: Never Used  . Alcohol use 0.0 oz/week  . Drug use: No   Current Outpatient Prescriptions on File Prior to Visit  Medication Sig Dispense Refill  . amLODipine (NORVASC) 10 MG tablet Take 1 tablet (10 mg total) by mouth daily. 30 tablet 2  . aspirin 81 MG tablet Take 81 mg by mouth daily.    Marland Kitchen CINNAMON PO Take 500 mg by mouth 2 (two) times daily.     . diphenhydrAMINE (BENADRYL) 25 MG tablet Take 25 mg by mouth every 6 (six) hours as needed.    . fenofibrate (TRICOR) 145 MG tablet Take 1 tablet (145 mg total) by mouth daily. 90 tablet 1  . JANUVIA 50 MG tablet  TAKE ONE TABLET BY MOUTH DAILY 90 tablet 0  . KRILL OIL PO Take by mouth.    . magnesium gluconate (MAGONATE) 500 MG tablet Take 500 mg by mouth 2 (two) times daily.    . metFORMIN (GLUCOPHAGE) 850 MG tablet Take 1 tablet (850 mg total) by mouth 3 (three) times daily. 270 tablet 1  . omeprazole (PRILOSEC) 20 MG capsule Take 1 capsule (20 mg total) by mouth daily. 90 capsule 1   Related testing: Retinal exam:done Date of retinal exam: scheduled next week Pneumovax: done Flu Shot: done  Review of Systems: Eye:  No recent significant change in vision Pulmonary:  No SOB Cardiovascular:  No chest pain, no palpitations Skin/Integumentary ROS:  No abnormal skin lesions reported Neurologic:  No numbness, tingling  Objective:  BP 120/72 (BP Location: Left Arm, Patient Position: Sitting, Cuff Size: Large)   Pulse 84   Temp 97.9 F (36.6 C) (Oral)   Ht 6\' 8"  (2.032 m)   Wt 278 lb 6.4 oz (126.3 kg)   SpO2 96%   BMI 30.58 kg/m  General:  Well developed, well nourished, in no apparent distress Skin:  Warm, no pallor or diaphoresis Head:  Normocephalic, atraumatic Eyes:  Pupils equal and round, sclera anicteric without injection  Nose:  External nares without trauma, no discharge Throat/Pharynx:  Lips and gingiva without lesion Neck: Neck supple.  No obvious thyromegaly or masses.  No bruits Lungs:  clear to auscultation, breath sounds equal bilaterally, no wheezes, rales, or stridor Cardio:  regular rate and rhythm without murmurs, no bruits, no LE edema Abdomen:  Abdomen soft, non-tender, BS normal Musculoskeletal:  TTP over greater trochanteric bursa b/l; no edema Psych: Age appropriate judgment and insight  Procedure Note; greater troch bursa injection, bilateral Verbal consent obtained. The area of greatest tenderness was palpated on each side and cleaned with alcohol x1. A 27-gauge needle was used to enter the area on each side. 40 mg of Depomedrol with 2 mL of 1% lidocaine was  injected in each location. The patient tolerated the procedure well. There were no complications noted.   Assessment:   Uncontrolled type 2 diabetes mellitus without complication, without long-term current use of insulin (HCC)  Acute renal failure, unspecified acute renal failure type ([place]) - Plan: Basic Metabolic Panel (BMET)  Greater trochanteric bursitis of both hips - Plan: PR DRAIN/INJECT LARGE JOINT/BURSA   Plan:   Orders as above. Increase Januvia to 100 mg daily now that renal function is improving. Counseled on diet and exercise. A1c goal for patient is 7.5. BP is in a good spot. F/u in 3 mo. The patient voiced understanding and agreement to the plan.  [place], DO 05/21/16 10:15 AM

## 2016-05-21 NOTE — Addendum Note (Signed)
Addended by: Harl Bowie on: 05/21/2016 11:51 AM   Modules accepted: Orders

## 2016-05-22 DIAGNOSIS — Z0100 Encounter for examination of eyes and vision without abnormal findings: Secondary | ICD-10-CM | POA: Diagnosis not present

## 2016-05-22 DIAGNOSIS — H52223 Regular astigmatism, bilateral: Secondary | ICD-10-CM | POA: Diagnosis not present

## 2016-05-25 LAB — HM DIABETES EYE EXAM

## 2016-06-05 ENCOUNTER — Ambulatory Visit: Payer: Self-pay | Admitting: Family Medicine

## 2016-06-07 ENCOUNTER — Other Ambulatory Visit: Payer: Self-pay | Admitting: Family

## 2016-07-09 ENCOUNTER — Ambulatory Visit (INDEPENDENT_AMBULATORY_CARE_PROVIDER_SITE_OTHER): Payer: Medicare HMO | Admitting: Family Medicine

## 2016-07-09 ENCOUNTER — Encounter: Payer: Self-pay | Admitting: Family Medicine

## 2016-07-09 VITALS — BP 134/84 | HR 84 | Temp 97.7°F | Ht >= 80 in | Wt 269.8 lb

## 2016-07-09 DIAGNOSIS — I10 Essential (primary) hypertension: Secondary | ICD-10-CM

## 2016-07-09 DIAGNOSIS — R634 Abnormal weight loss: Secondary | ICD-10-CM | POA: Diagnosis not present

## 2016-07-09 DIAGNOSIS — E1165 Type 2 diabetes mellitus with hyperglycemia: Secondary | ICD-10-CM | POA: Diagnosis not present

## 2016-07-09 DIAGNOSIS — R350 Frequency of micturition: Secondary | ICD-10-CM

## 2016-07-09 DIAGNOSIS — IMO0001 Reserved for inherently not codable concepts without codable children: Secondary | ICD-10-CM

## 2016-07-09 LAB — COMPREHENSIVE METABOLIC PANEL
ALK PHOS: 41 U/L (ref 39–117)
ALT: 35 U/L (ref 0–53)
AST: 29 U/L (ref 0–37)
Albumin: 4.3 g/dL (ref 3.5–5.2)
BILIRUBIN TOTAL: 0.6 mg/dL (ref 0.2–1.2)
BUN: 19 mg/dL (ref 6–23)
CALCIUM: 10.1 mg/dL (ref 8.4–10.5)
CO2: 28 meq/L (ref 19–32)
CREATININE: 1.45 mg/dL (ref 0.40–1.50)
Chloride: 101 mEq/L (ref 96–112)
GFR: 51.22 mL/min — AB (ref >60.00)
GLUCOSE: 320 mg/dL — AB (ref 70–99)
Potassium: 4.1 mEq/L (ref 3.5–5.1)
Sodium: 136 mEq/L (ref 135–145)
TOTAL PROTEIN: 7.4 g/dL (ref 6.0–8.3)

## 2016-07-09 LAB — POCT URINALYSIS DIPSTICK
Bilirubin, UA: NEGATIVE
Blood, UA: NEGATIVE
Glucose, UA: POSITIVE
Ketones, UA: NEGATIVE
Nitrite, UA: NEGATIVE
PH UA: 6
PROTEIN UA: NEGATIVE
Spec Grav, UA: >=1.03
Urobilinogen, UA: NEGATIVE

## 2016-07-09 LAB — CBC
HCT: 43.2 % (ref 39.0–52.0)
HEMOGLOBIN: 14.5 g/dL (ref 13.0–17.0)
MCHC: 33.6 g/dL (ref 30.0–36.0)
MCV: 88.8 fl (ref 78.0–100.0)
PLATELETS: 169 10*3/uL (ref 150.0–400.0)
RBC: 4.87 Mil/uL (ref 4.22–5.81)
RDW: 14.3 % (ref 11.5–15.5)
WBC: 5.7 10*3/uL (ref 4.0–10.5)

## 2016-07-09 LAB — TSH: TSH: 1.12 u[IU]/mL (ref 0.35–4.50)

## 2016-07-09 MED ORDER — GLIPIZIDE ER 10 MG PO TB24: 10.0000 mg | ORAL_TABLET | Freq: Every day | ORAL | 1 refills | Status: DC

## 2016-07-09 NOTE — Patient Instructions (Signed)
Let me know if your medicines are too expensive.

## 2016-07-09 NOTE — Progress Notes (Signed)
Subjective:   Chief Complaint  Patient presents with  . Diabetes    Can't get blood sugars below 300. Blood sugar was 345 this morning.. Also has issues with urinary frequency and extreme thirst.  . Weight Loss    Has lost 10-15 pounds within the last 6 weeks without any attempts at weight loss.     Ronald Barr is a 71 y.o. male here for follow-up of diabetes.   His sugars have consistent been in the 300s since going off of Glucotrol for Januvia. He is currently taking metformin and Januvia. He also has associated increased urination, weight loss (10-15 pounds over the past month), and increased thirst. His diet has not changed. Due to bilateral hip pain, he does not exercise very often. He denies any abdominal pain, shortness of breath, cough, swelling, or dark/tarry/bloody stools. His last colonoscopy was 2 years ago and was normal.  Past Medical History:  Diagnosis Date  . Cancer [facility])    prostate  . Diabetes mellitus without complication ([place])   . Essential hypertension 03/06/2016  . GERD (gastroesophageal reflux disease)   . Hyperlipidemia   . Sleep apnea    sleeps with BPAP machine    Past Surgical History:  Procedure Laterality Date  . nose and throat surgery     sleep apnes  . SHOULDER SURGERY    . TONSILLECTOMY      Social History   Social History  . Marital status: Married   Social History Main Topics  . Smoking status: Former Smoker    Quit date: 06/01/1979  . Smokeless tobacco: Never Used  . Alcohol use 0.0 oz/week  . Drug use: No   Current Outpatient Prescriptions on File Prior to Visit  Medication Sig Dispense Refill  . amLODipine (NORVASC) 10 MG tablet Take 1 tablet (10 mg total) by mouth daily. 30 tablet 2  . aspirin 81 MG tablet Take 81 mg by mouth daily.    Marland Kitchen CINNAMON PO Take 500 mg by mouth 2 (two) times daily.     . diphenhydrAMINE (BENADRYL) 25 MG tablet Take 25 mg by mouth every 6 (six) hours as needed.    . fenofibrate (TRICOR) 145 MG tablet  Take 1 tablet (145 mg total) by mouth daily. 90 tablet 1  . KRILL OIL PO Take by mouth.    . magnesium gluconate (MAGONATE) 500 MG tablet Take 500 mg by mouth 2 (two) times daily.    . metFORMIN (GLUCOPHAGE) 850 MG tablet Take 1 tablet (850 mg total) by mouth 3 (three) times daily. 270 tablet 1  . omeprazole (PRILOSEC) 20 MG capsule Take 1 capsule (20 mg total) by mouth daily. 90 capsule 1  . sitaGLIPtin (JANUVIA) 100 MG tablet Take 1 tablet (100 mg total) by mouth daily. 90 tablet 1    Review of Systems: Eye:  No recent significant change in vision Pulmonary:  No SOB Cardiovascular:  No chest pain, no palpitations Skin/Integumentary ROS:  No abnormal skin lesions reported Neurologic:  No numbness, tingling  Objective:  BP 134/84 (BP Location: Left Arm, Patient Position: Sitting, Cuff Size: Large)   Pulse 84   Temp 97.7 F (36.5 C) (Oral)   Ht 6\' 8"  (2.032 m)   Wt 269 lb 12.8 oz (122.4 kg)   SpO2 95% Comment: RA  BMI 29.64 kg/m  General:  Well developed, well nourished, in no apparent distress Skin:  Warm, no pallor or diaphoresis Head:  Normocephalic, atraumatic Eyes:  Pupils equal and round, sclera  anicteric without injection  Nose:  External nares without trauma, no discharge Throat/Pharynx:  Lips and gingiva without lesion Neck: Neck supple.  No obvious thyromegaly or masses.  No bruits Lungs:  clear to auscultation, breath sounds equal bilaterally, no wheezes, rales, or stridor Cardio:  regular rate and rhythm without murmurs, no bruits, no LE edema Psych: Age appropriate judgment and insight  Assessment:   Uncontrolled type 2 diabetes mellitus without complication, without long-term current use of insulin (HCC) - Plan: glipiZIDE (GLUCOTROL XL) 10 MG 24 hr tablet  Essential hypertension  Urinary frequency - Plan: POCT Urinalysis Dipstick  Unintended weight loss - Plan: CBC, Comprehensive metabolic panel, TSH   Plan:   Orders as above. Will go back on Glucotrol.  The pressures not currently at goal. We'll wait until his sugars and symptoms can be managed better. There are no ketones in his urine. We'll obtain labs to rule out sinister causes of unintentional weight loss, though this is highly likely related to uncontrolled diabetes. F/u in 2 weeks. The patient voiced understanding and agreement to the plan.  [place], DO 07/09/16 9:06 AM

## 2016-07-09 NOTE — Progress Notes (Signed)
Pre visit review using our clinic review tool, if applicable. No additional management support is needed unless otherwise documented below in the visit note. 

## 2016-07-23 ENCOUNTER — Ambulatory Visit (INDEPENDENT_AMBULATORY_CARE_PROVIDER_SITE_OTHER): Payer: Medicare HMO | Admitting: Family Medicine

## 2016-07-23 ENCOUNTER — Encounter: Payer: Self-pay | Admitting: Family Medicine

## 2016-07-23 VITALS — BP 122/78 | HR 77 | Temp 97.8°F | Ht >= 80 in | Wt 276.2 lb

## 2016-07-23 DIAGNOSIS — M791 Myalgia, unspecified site: Secondary | ICD-10-CM

## 2016-07-23 DIAGNOSIS — E1165 Type 2 diabetes mellitus with hyperglycemia: Secondary | ICD-10-CM

## 2016-07-23 DIAGNOSIS — IMO0001 Reserved for inherently not codable concepts without codable children: Secondary | ICD-10-CM

## 2016-07-23 LAB — VITAMIN D 25 HYDROXY (VIT D DEFICIENCY, FRACTURES): VITD: 34.82 ng/mL (ref 30.00–100.00)

## 2016-07-23 MED ORDER — GLIPIZIDE ER 5 MG PO TB24: 5.0000 mg | ORAL_TABLET | Freq: Every day | ORAL | 1 refills | Status: DC

## 2016-07-23 NOTE — Progress Notes (Signed)
Chief Complaint  Patient presents with  . Follow-up    on DM-pt's FBS:207-this am    Subjective: Patient is a 71 y.o. male here for DM f/u.  Seen 2 weeks ago, placed back on Glipizide. Symptoms have greatly improved, he is no longer peeing frequently and is not having as high of sugar readings. He is tolerating the medicine well. He did pay a lot of money for Januvia, but is also tolerating this well. He continues to do well with the Metformin.    ROS: Heart: Denies chest pain  Lungs: Denies SOB   Family History  Problem Relation Age of Onset  . Hypertension Mother   . Hypertension Father    Past Medical History:  Diagnosis Date  . Cancer [facility])    prostate  . Diabetes mellitus without complication ([place])   . Essential hypertension 03/06/2016  . GERD (gastroesophageal reflux disease)   . Hyperlipidemia   . Sleep apnea    sleeps with BPAP machine   Allergies  Allergen Reactions  . Statins     Cramping, intolerant of Crestor and Pravastatin  . Sulfa Antibiotics     Current Outpatient Prescriptions:  .  amLODipine (NORVASC) 10 MG tablet, Take 1 tablet (10 mg total) by mouth daily., Disp: 30 tablet, Rfl: 2 .  aspirin 81 MG tablet, Take 81 mg by mouth daily., Disp: , Rfl:  .  CINNAMON PO, Take 500 mg by mouth 2 (two) times daily. , Disp: , Rfl:  .  diphenhydrAMINE (BENADRYL) 25 MG tablet, Take 25 mg by mouth every 6 (six) hours as needed., Disp: , Rfl:  .  fenofibrate (TRICOR) 145 MG tablet, Take 1 tablet (145 mg total) by mouth daily., Disp: 90 tablet, Rfl: 1 .  glipiZIDE (GLUCOTROL XL) 10 MG 24 hr tablet, Take 1 tablet (10 mg total) by mouth daily., Disp: 90 tablet, Rfl: 1 .  KRILL OIL PO, Take by mouth., Disp: , Rfl:  .  magnesium gluconate (MAGONATE) 500 MG tablet, Take 500 mg by mouth 2 (two) times daily., Disp: , Rfl:  .  metFORMIN (GLUCOPHAGE) 850 MG tablet, Take 1 tablet (850 mg total) by mouth 3 (three) times daily., Disp: 270 tablet, Rfl: 1 .  omeprazole (PRILOSEC)  20 MG capsule, Take 1 capsule (20 mg total) by mouth daily., Disp: 90 capsule, Rfl: 1 .  sitaGLIPtin (JANUVIA) 100 MG tablet, Take 1 tablet (100 mg total) by mouth daily., Disp: 90 tablet, Rfl: 1 .  glipiZIDE (GLIPIZIDE XL) 5 MG 24 hr tablet, Take 1 tablet (5 mg total) by mouth daily with breakfast., Disp: 90 tablet, Rfl: 1  Objective: BP 122/78 (BP Location: Left Arm, Patient Position: Sitting, Cuff Size: Large)   Pulse 77   Temp 97.8 F (36.6 C) (Oral)   Ht 6\' 8"  (2.032 m)   Wt 276 lb 3.2 oz (125.3 kg)   SpO2 94%   BMI 30.34 kg/m  General: Awake, appears stated age Lungs: No accessory muscle use Psych: Age appropriate judgment and insight, normal affect and mood  Assessment and Plan: Uncontrolled type 2 diabetes mellitus without complication, without long-term current use of insulin (HCC) - Plan: glipiZIDE (GLIPIZIDE XL) 5 MG 24 hr tablet  Myalgia - Plan: Vitamin D (25 hydroxy)  Orders as above. Increase dose of Glipizide from 10 mg daily to 15 mg daily XL. Stay on Metformin. May go off of Januvia in future. Check Vit D, will treat and possibly start pitavastatin vs Zetia. He has been very intolerant  of statins in the past including pravastatin, atorvastatin, simvastatin and rosuvastatin. F/u in 6 weeks, if A1c controlled, will discuss coming off of Januvia and starting Actos.  The patient voiced understanding and agreement to the plan.  >25 min spent face to face wth patient and >50% spent on coordination of care and counseling regarding muscle aches, statin therapy, and DM medicatoin.  [place], DO 07/23/16  9:43 AM

## 2016-07-23 NOTE — Patient Instructions (Addendum)
Take new Glipizide with your current dose. If you start having low sugar readings, let us know.

## 2016-07-23 NOTE — Progress Notes (Signed)
Pre visit review using our clinic review tool, if applicable. No additional management support is needed unless otherwise documented below in the visit note. 

## 2016-07-28 ENCOUNTER — Other Ambulatory Visit: Payer: Self-pay | Admitting: *Deleted

## 2016-07-28 MED ORDER — AMLODIPINE BESYLATE 10 MG PO TABS: 10.0000 mg | ORAL_TABLET | Freq: Every day | ORAL | 1 refills | Status: DC

## 2016-07-28 NOTE — Telephone Encounter (Signed)
Rx sent to the pharmacy by e-script.//AB/CMA 

## 2016-09-02 ENCOUNTER — Other Ambulatory Visit: Payer: Self-pay

## 2016-09-02 DIAGNOSIS — E119 Type 2 diabetes mellitus without complications: Secondary | ICD-10-CM

## 2016-09-02 MED ORDER — METFORMIN HCL 850 MG PO TABS: 850.0000 mg | ORAL_TABLET | Freq: Three times a day (TID) | ORAL | 1 refills | Status: DC

## 2016-09-03 ENCOUNTER — Ambulatory Visit (INDEPENDENT_AMBULATORY_CARE_PROVIDER_SITE_OTHER): Payer: Medicare HMO | Admitting: Family Medicine

## 2016-09-03 ENCOUNTER — Encounter: Payer: Self-pay | Admitting: Family Medicine

## 2016-09-03 VITALS — BP 120/68 | HR 77 | Temp 98.0°F | Ht >= 80 in | Wt 282.8 lb

## 2016-09-03 DIAGNOSIS — E119 Type 2 diabetes mellitus without complications: Secondary | ICD-10-CM | POA: Diagnosis not present

## 2016-09-03 DIAGNOSIS — K219 Gastro-esophageal reflux disease without esophagitis: Secondary | ICD-10-CM

## 2016-09-03 LAB — HEMOGLOBIN A1C: Hgb A1c MFr Bld: 9.8 % — ABNORMAL HIGH (ref 4.6–6.5)

## 2016-09-03 LAB — COMPREHENSIVE METABOLIC PANEL
ALK PHOS: 35 U/L — AB (ref 39–117)
ALT: 36 U/L (ref 0–53)
AST: 36 U/L (ref 0–37)
Albumin: 4.2 g/dL (ref 3.5–5.2)
BUN: 18 mg/dL (ref 6–23)
CHLORIDE: 106 meq/L (ref 96–112)
CO2: 29 mEq/L (ref 19–32)
Calcium: 9.6 mg/dL (ref 8.4–10.5)
Creatinine, Ser: 1.4 mg/dL (ref 0.40–1.50)
GFR: 53.32 mL/min — ABNORMAL LOW (ref >60.00)
GLUCOSE: 193 mg/dL — AB (ref 70–99)
POTASSIUM: 4.1 meq/L (ref 3.5–5.1)
SODIUM: 141 meq/L (ref 135–145)
Total Bilirubin: 0.6 mg/dL (ref 0.2–1.2)
Total Protein: 7 g/dL (ref 6.0–8.3)

## 2016-09-03 MED ORDER — OMEPRAZOLE 20 MG PO CPDR: 20.0000 mg | DELAYED_RELEASE_CAPSULE | Freq: Every day | ORAL | 3 refills | Status: DC

## 2016-09-03 NOTE — Progress Notes (Signed)
Subjective:   Chief Complaint  Patient presents with  . Follow-up    3 mos for DM    Ronald Barr is a 71 y.o. male here for follow-up of diabetes.   Ronald Barr's self monitored glucose range is 120's Patient denies hypoglycemic reactions. He checks his glucose levels 3x/week Patient does not require insulin.   Medications include: Glipizide 15 mg XL daily and Metformin 850 mg TID. Also on Januvia 100 mg daily.  Patient exercises 0 days per week on average.   He does take an aspirin daily. Statin? No- statin intolerant despite multiple trials of different ones ACEi/ARB? No- did have issue with being on both ACEi and ARB before coming here, GFR had suffered, continues to improve  Past Medical History:  Diagnosis Date  . Cancer [facility])    prostate  . Diabetes mellitus without complication ([place])   . Essential hypertension 03/06/2016  . GERD (gastroesophageal reflux disease)   . Hyperlipidemia   . Sleep apnea    sleeps with BPAP machine    Past Surgical History:  Procedure Laterality Date  . nose and throat surgery     sleep apnes  . SHOULDER SURGERY    . TONSILLECTOMY      Social History   Social History  . Marital status: Married   Social History Main Topics  . Smoking status: Former Smoker    Quit date: 06/01/1979  . Smokeless tobacco: Never Used  . Alcohol use 0.0 oz/week  . Drug use: No   Current Outpatient Prescriptions on File Prior to Visit  Medication Sig Dispense Refill  . amLODipine (NORVASC) 10 MG tablet Take 1 tablet (10 mg total) by mouth daily. 90 tablet 1  . aspirin 81 MG tablet Take 81 mg by mouth daily.    Ronald Barr Take 500 mg by mouth 2 (two) times daily.     . fenofibrate (TRICOR) 145 MG tablet Take 1 tablet (145 mg total) by mouth daily. 90 tablet 1  . glipiZIDE (GLIPIZIDE XL) 5 MG 24 hr tablet Take 1 tablet (5 mg total) by mouth daily with breakfast. 90 tablet 1  . glipiZIDE (GLUCOTROL XL) 10 MG 24 hr tablet Take 1 tablet (10 mg total) by mouth  daily. 90 tablet 1  . metFORMIN (GLUCOPHAGE) 850 MG tablet Take 1 tablet (850 mg total) by mouth 3 (three) times daily. 270 tablet 1  . omeprazole (PRILOSEC) 20 MG capsule Take 1 capsule (20 mg total) by mouth daily. 90 capsule 1  . sitaGLIPtin (JANUVIA) 100 MG tablet Take 1 tablet (100 mg total) by mouth daily. 90 tablet 1   Related testing: Foot exam(monofilament and inspection):done Date of retinal exam: 05/2016  Done by:  Dr. Bing Plume Pneumovax: done Flu Shot: done  Review of Systems: Eye:  No recent significant change in vision Pulmonary:  No SOB Cardiovascular:  No chest pain Neurologic:  No numbness, tingling  Objective:  BP 120/68 (BP Location: Left Arm, Patient Position: Sitting, Cuff Size: Large)   Pulse 77   Temp 98 F (36.7 C) (Oral)   Ht 6\' 8"  (2.032 m)   Wt 282 lb 12.8 oz (128.3 kg)   SpO2 95%   BMI 31.07 kg/m  General:  Well developed, well nourished, in no apparent distress Skin:  Warm, no pallor or diaphoresis Throat/Pharynx:  Lips and gingiva without lesion Lungs:  clear to auscultation, breath sounds equal bilaterally, no wheezes, rales, or stridor Cardio:  regular rate and rhythm without murmurs, no bruits,  trace LE edema Psych: Age appropriate judgment and insight  Assessment:   Diabetes mellitus without complication (Ronald Barr) - Plan: Hemoglobin A1c, Comprehensive metabolic panel  Gastroesophageal reflux disease, esophagitis presence not specified - Plan: omeprazole (PRILOSEC) 20 MG capsule   Plan:   Recheck labs. Sounds like he is doing well, will keep current regimen.  F/u pending A1c. The patient voiced understanding and agreement to the plan.  [place], DO 09/03/16 9:30 AM

## 2016-09-03 NOTE — Progress Notes (Signed)
Pre visit review using our clinic review tool, if applicable. No additional management support is needed unless otherwise documented below in the visit note. 

## 2016-09-18 ENCOUNTER — Ambulatory Visit: Payer: Self-pay | Admitting: Family Medicine

## 2016-11-02 ENCOUNTER — Telehealth: Payer: Self-pay | Admitting: Family Medicine

## 2016-11-02 DIAGNOSIS — E119 Type 2 diabetes mellitus without complications: Secondary | ICD-10-CM

## 2016-11-02 MED ORDER — FENOFIBRATE 145 MG PO TABS: 145.0000 mg | ORAL_TABLET | Freq: Every day | ORAL | 1 refills | Status: DC

## 2016-11-02 NOTE — Telephone Encounter (Signed)
Caller name: Relationship to patient: Self Can be reached: [phone]  Pharmacy:  [facility] [place], [place] - [address] [phone] (Phone) [phone] (Fax)     Reason for call: Refill fenofibrate (TRICOR) 145 MG tablet

## 2016-11-02 NOTE — Telephone Encounter (Signed)
Rx approved and sent to the pharmacy by e-script.//AB/CMA 

## 2016-11-06 DIAGNOSIS — Z0101 Encounter for examination of eyes and vision with abnormal findings: Secondary | ICD-10-CM | POA: Diagnosis not present

## 2016-11-20 ENCOUNTER — Other Ambulatory Visit: Payer: Self-pay | Admitting: Family Medicine

## 2016-11-23 NOTE — Telephone Encounter (Signed)
Rx approved and sent to the pharmacy by e-script.//AB/CMA 

## 2016-12-10 ENCOUNTER — Ambulatory Visit (INDEPENDENT_AMBULATORY_CARE_PROVIDER_SITE_OTHER): Payer: Medicare HMO | Admitting: Family Medicine

## 2016-12-10 ENCOUNTER — Encounter: Payer: Self-pay | Admitting: Family Medicine

## 2016-12-10 VITALS — BP 128/88 | HR 77 | Temp 98.4°F | Ht >= 80 in | Wt 283.6 lb

## 2016-12-10 DIAGNOSIS — E1165 Type 2 diabetes mellitus with hyperglycemia: Secondary | ICD-10-CM | POA: Diagnosis not present

## 2016-12-10 DIAGNOSIS — M79644 Pain in right finger(s): Secondary | ICD-10-CM

## 2016-12-10 DIAGNOSIS — IMO0001 Reserved for inherently not codable concepts without codable children: Secondary | ICD-10-CM

## 2016-12-10 LAB — LIPID PANEL
CHOL/HDL RATIO: 5
CHOLESTEROL: 197 mg/dL (ref 0–200)
HDL: 37.6 mg/dL — ABNORMAL LOW (ref >39.00)
LDL CALC: 133 mg/dL — AB (ref 0–99)
NonHDL: 159.15
Triglycerides: 129 mg/dL (ref 0.0–149.0)
VLDL: 25.8 mg/dL (ref 0.0–40.0)

## 2016-12-10 LAB — HEMOGLOBIN A1C: Hgb A1c MFr Bld: 8.6 % — ABNORMAL HIGH (ref 4.6–6.5)

## 2016-12-10 MED ORDER — GLIPIZIDE ER 10 MG PO TB24: 20.0000 mg | ORAL_TABLET | Freq: Every day | ORAL | 1 refills | Status: DC

## 2016-12-10 MED ORDER — METHYLPREDNISOLONE 4 MG PO TBPK: ORAL_TABLET | ORAL | 0 refills | Status: DC

## 2016-12-10 NOTE — Patient Instructions (Addendum)
Do not refill Januvia anymore. Call/send MyChart message and let me know when you are about to run out and say you are ready to try an alternative.  If you do not hear anything about your referral in the next 1-2 weeks, call our office and ask for an update.

## 2016-12-10 NOTE — Progress Notes (Signed)
Subjective:   Chief Complaint  Patient presents with  . Follow-up    3 mos on DM.  He is also C/O right thumb pain x 3 weeks. Does not have full ROM and denies any injury.    Ronald Barr is a 71 y.o. male here for follow-up of diabetes.   [name]'s self monitored glucose range is 150's.  Patient denies hypoglycemic reactions. He checks his glucose levels 1x/week. Patient does not require insulin.   Medications include: Januiva, Metformin 850 mg TID, Glipizide XL 15 mg daily Patient exercises 0 days per week on average.   He does take an aspirin daily. Statin? No- intolerant to several statins due to myalgias ACEi/ARB? Not since being on both ARB and ACEi  R thumb pain Pain over base of right thumb over the past 3 weeks. No injury or change in activity. The thumb joint will sometimes catch/lock. He is not noticing any numbness, tearing, swelling, or bruising. He has been trying ibuprofen at home with minimal relief.  Past Medical History:  Diagnosis Date  . Cancer [name]    prostate  . Diabetes mellitus without complication ([name])   . Essential hypertension 03/06/2016  . GERD (gastroesophageal reflux disease)   . Hyperlipidemia   . Sleep apnea    sleeps with BPAP machine    Past Surgical History:  Procedure Laterality Date  . nose and throat surgery     sleep apnes  . SHOULDER SURGERY    . TONSILLECTOMY      Social History   Social History  . Marital status: Married   Social History Main Topics  . Smoking status: Former Smoker    Quit date: 06/01/1979  . Smokeless tobacco: Never Used  . Alcohol use 0.0 oz/week  . Drug use: No   Current Outpatient Prescriptions on File Prior to Visit  Medication Sig Dispense Refill  . amLODipine (NORVASC) 10 MG tablet Take 1 tablet (10 mg total) by mouth daily. 90 tablet 1  . aspirin 81 MG tablet Take 81 mg by mouth daily.    Marland Kitchen CINNAMON PO Take 500 mg by mouth 2 (two) times daily.     . fenofibrate (TRICOR) 145 MG tablet Take 1  tablet (145 mg total) by mouth daily. 90 tablet 1  . glipiZIDE (GLIPIZIDE XL) 5 MG 24 hr tablet Take 1 tablet (5 mg total) by mouth daily with breakfast. 90 tablet 1  . glipiZIDE (GLUCOTROL XL) 10 MG 24 hr tablet Take 1 tablet (10 mg total) by mouth daily. 90 tablet 1  . JANUVIA 100 MG tablet TAKE ONE TABLET BY MOUTH DAILY 90 tablet 1  . metFORMIN (GLUCOPHAGE) 850 MG tablet Take 1 tablet (850 mg total) by mouth 3 (three) times daily. 270 tablet 1  . omeprazole (PRILOSEC) 20 MG capsule Take 1 capsule (20 mg total) by mouth daily. 90 capsule 3   Related testing: Foot exam(monofilament and inspection):done Date of retinal exam: 05/2016 Done by:  Dr. Bing Plume Pneumovax: done Flu Shot: N/A  Review of Systems: Eye:  No recent significant change in vision Pulmonary:  No SOB Cardiovascular:  No chest pain, no palpitations Skin/Integumentary ROS:  No abnormal skin lesions reported Neurologic:  No numbness, tingling  Objective:  BP 128/88 (BP Location: Left Arm, Patient Position: Sitting, Cuff Size: Large)   Pulse 77   Temp 98.4 F (36.9 C) (Oral)   Ht 6\' 8"  (2.032 m)   Wt 283 lb 9.6 oz (128.6 kg)   SpO2 96%  BMI 31.16 kg/m  General:  Well developed, well nourished, in no apparent distress Skin:  Warm, no pallor or diaphoresis Head:  Normocephalic, atraumatic Eyes:  Pupils equal and round, sclera anicteric without injection  Nose:  External nares without trauma, no discharge Throat/Pharynx:  Lips and gingiva without lesion Neck: Neck supple.  No obvious thyromegaly or masses.  No bruits Lungs:  clear to auscultation, breath sounds equal bilaterally, no wheezes, rales, or stridor Cardio:  regular rate and rhythm without murmurs, no bruits, no LE edema Abdomen:  Abdomen soft, non-tender, BS normal Musculoskeletal: R thumb TTP over 1st MCP, no edema or warmth, decreased flexion of IP joint, it does catch and release at a certain ROM Neuro:  Sensation intact to pinprick on feet w/ exception  of L great toe Psych: Age appropriate judgment and insight  Assessment:   Uncontrolled type 2 diabetes mellitus without complication, without long-term current use of insulin (HCC) - Plan: glipiZIDE (GLUCOTROL XL) 10 MG 24 hr tablet, HgB A1c, Lipid panel  Thumb pain, right - Plan: Ambulatory referral to Hand Surgery, methylPREDNISolone (MEDROL DOSEPAK) 4 MG TBPK tablet   Plan:   Orders as above. Increase dose of glipizide from 15 mg daily to 20 mg daily. Stop januvia when refill comes up 2/2 cost. Will add SGLT-2 vs TZD. For thumb, question trigger finger? Will refer to hand. Steroids for relief.  F/u in 3 mo. The patient voiced understanding and agreement to the plan.  [place], DO 12/10/16 10:14 AM

## 2016-12-17 ENCOUNTER — Ambulatory Visit (INDEPENDENT_AMBULATORY_CARE_PROVIDER_SITE_OTHER): Payer: Medicare HMO | Admitting: Orthopedic Surgery

## 2016-12-17 ENCOUNTER — Encounter (INDEPENDENT_AMBULATORY_CARE_PROVIDER_SITE_OTHER): Payer: Self-pay | Admitting: Orthopedic Surgery

## 2016-12-17 VITALS — Ht >= 80 in | Wt 283.0 lb

## 2016-12-17 DIAGNOSIS — M65311 Trigger thumb, right thumb: Secondary | ICD-10-CM

## 2016-12-17 NOTE — Progress Notes (Signed)
   Office Visit Note   Patient: Ronald Barr           Date of Birth: 10-18-1945           MRN: [id number] Visit Date: 12/17/2016              Requested by: Shelda Pal, [address] [place], [place] [address] PCP: Shelda Pal, DO  Chief Complaint  Patient presents with  . Right Hand - Pain    Thumb triggering       HPI: Patient is a 71 year old gentleman with diabetes hypertension and high cholesterol most recent hemoglobin A1c of 8.6 with sleep apnea and uses his CPAP machine. Patient states he could not move his thumb at all last week planes of painful triggering at the A1 pulley pain is been increasing over the past 3 weeks.  Assessment & Plan: Visit Diagnoses:  1. Trigger thumb, right thumb     Plan: We will plan for release of the A1 pulley at the surgical center. Risks and benefits were discussed including risk of neurovascular injury recurrent triggering. Patient states he understands wishes to proceed at this time.  Follow-Up Instructions: Return in about 1 week (around 12/24/2016).   Ortho Exam  Patient is alert, oriented, no adenopathy, well-dressed, normal affect, normal respiratory effort. Examination patient has palpable triggering at the A1 pulley there appears to be a nodule over the flexor tendon. His thumb is neurovascularly intact there is no triggering of the other fingers there is no Dupuytren's contracture.  Imaging: No results found.  Labs: Lab Results  Component Value Date   HGBA1C 8.6 (H) 12/10/2016   HGBA1C 9.8 (H) 09/03/2016   HGBA1C 8.3 (H) 04/17/2016    Orders:  No orders of the defined types were placed in this encounter.  No orders of the defined types were placed in this encounter.    Procedures: No procedures performed  Clinical Data: No additional findings.  ROS:  All other systems negative, except as noted in the HPI. Review of Systems  Objective: Vital Signs: Ht 6\' 8"  (2.032  m)   Wt 283 lb (128.4 kg)   BMI 31.09 kg/m   Specialty Comments:  No specialty comments available.  PMFS History: Patient Active Problem List   Diagnosis Date Noted  . Trigger thumb, right thumb 12/17/2016  . Diabetes type 2, uncontrolled ([place]) 04/17/2016  . Essential hypertension 03/06/2016  . Chronic venous insufficiency 05/31/2014   Past Medical History:  Diagnosis Date  . Cancer [facility])    prostate  . Diabetes mellitus without complication ([place])   . Essential hypertension 03/06/2016  . GERD (gastroesophageal reflux disease)   . Hyperlipidemia   . Sleep apnea    sleeps with BPAP machine    Family History  Problem Relation Age of Onset  . Hypertension Mother   . Hypertension Father     Past Surgical History:  Procedure Laterality Date  . nose and throat surgery     sleep apnes  . SHOULDER SURGERY    . TONSILLECTOMY     Social History   Occupational History  . Not on file.   Social History Main Topics  . Smoking status: Former Smoker    Quit date: 06/01/1979  . Smokeless tobacco: Never Used  . Alcohol use 0.0 oz/week  . Drug use: No  . Sexual activity: Not on file

## 2016-12-29 DIAGNOSIS — M65311 Trigger thumb, right thumb: Secondary | ICD-10-CM | POA: Diagnosis not present

## 2017-01-07 ENCOUNTER — Ambulatory Visit (INDEPENDENT_AMBULATORY_CARE_PROVIDER_SITE_OTHER): Payer: Medicare HMO | Admitting: Orthopedic Surgery

## 2017-01-07 DIAGNOSIS — M720 Palmar fascial fibromatosis [Dupuytren]: Secondary | ICD-10-CM

## 2017-01-07 DIAGNOSIS — M65311 Trigger thumb, right thumb: Secondary | ICD-10-CM

## 2017-01-07 NOTE — Progress Notes (Signed)
Office Visit Note   Patient: Ronald Barr           Date of Birth: 03-Mar-1946           MRN: [id number] Visit Date: 01/07/2017              Requested by: Shelda Pal, [address] [place], [place] [address] PCP: Shelda Pal, DO  Chief Complaint  Patient presents with  . follow up    Rthand thumb DOS 12-29-16      HPI: Patient presents 1 week status post release the A1 pulley right thumb with triggering. Patient states that his thumb moves better he has a lot less pain but he is having some popping with range of motion. Patient also complains of Dupuytren's contracture along the ring finger right hand.  Assessment & Plan: Visit Diagnoses:  1. Trigger thumb, right thumb   2. Dupuytrens contracture     Plan: Recommended range of motion of the lesser fingers to prevent contracture from the Dupuytren's. Recommended range of motion of the IP joint of the thumb patient has very limited range of motion of the MCP joint of the thumb. Recommended moisturizing lotion for the scar massage.  Follow-Up Instructions: Return in about 4 weeks (around 02/04/2017).   Ortho Exam  Patient is alert, oriented, no adenopathy, well-dressed, normal affect, normal respiratory effort. Examination patient has excellent range of motion of the IP joint right thumb he has a little bit of numbness on the ulnar border of the thumb. He has Dupuytren's involving the ring finger but there is no contracture he has full extension full flexion. Patient has very limited range of motion of the MCP joint of the thumb but has good range of motion of the carpometacarpal joint.   Imaging: No results found. No images are attached to the encounter.  Labs: Lab Results  Component Value Date   HGBA1C 8.6 (H) 12/10/2016   HGBA1C 9.8 (H) 09/03/2016   HGBA1C 8.3 (H) 04/17/2016    Orders:  No orders of the defined types were placed in this encounter.  No orders of the defined  types were placed in this encounter.    Procedures: No procedures performed  Clinical Data: No additional findings.  ROS:  All other systems negative, except as noted in the HPI. Review of Systems  Objective: Vital Signs: There were no vitals taken for this visit.  Specialty Comments:  No specialty comments available.  PMFS History: Patient Active Problem List   Diagnosis Date Noted  . Dupuytrens contracture 01/07/2017  . Trigger thumb, right thumb 12/17/2016  . Diabetes type 2, uncontrolled ([place]) 04/17/2016  . Essential hypertension 03/06/2016  . Chronic venous insufficiency 05/31/2014   Past Medical History:  Diagnosis Date  . Cancer [facility])    prostate  . Diabetes mellitus without complication ([place])   . Essential hypertension 03/06/2016  . GERD (gastroesophageal reflux disease)   . Hyperlipidemia   . Sleep apnea    sleeps with BPAP machine    Family History  Problem Relation Age of Onset  . Hypertension Mother   . Hypertension Father     Past Surgical History:  Procedure Laterality Date  . nose and throat surgery     sleep apnes  . SHOULDER SURGERY    . TONSILLECTOMY     Social History   Occupational History  . Not on file.   Social History Main Topics  . Smoking status: Former Smoker  Quit date: 06/01/1979  . Smokeless tobacco: Never Used  . Alcohol use 0.0 oz/week  . Drug use: No  . Sexual activity: Not on file

## 2017-01-16 ENCOUNTER — Other Ambulatory Visit: Payer: Self-pay | Admitting: Family Medicine

## 2017-01-16 DIAGNOSIS — IMO0001 Reserved for inherently not codable concepts without codable children: Secondary | ICD-10-CM

## 2017-01-16 DIAGNOSIS — E1165 Type 2 diabetes mellitus with hyperglycemia: Principal | ICD-10-CM

## 2017-02-04 ENCOUNTER — Encounter (INDEPENDENT_AMBULATORY_CARE_PROVIDER_SITE_OTHER): Payer: Self-pay | Admitting: Orthopedic Surgery

## 2017-02-04 ENCOUNTER — Ambulatory Visit (INDEPENDENT_AMBULATORY_CARE_PROVIDER_SITE_OTHER): Payer: Medicare HMO | Admitting: Orthopedic Surgery

## 2017-02-04 DIAGNOSIS — M65311 Trigger thumb, right thumb: Secondary | ICD-10-CM

## 2017-02-04 NOTE — Progress Notes (Signed)
   Office Visit Note   Patient: Ronald Barr           Date of Birth: 09-18-1945           MRN: [id number] Visit Date: 02/04/2017              Requested by: Shelda Pal, [address] [place], [place] [address] PCP: Shelda Pal, DO  Chief Complaint  Patient presents with  . Right Thumb - Routine Post Op    12/29/16 Right Thumb A1 Pulley Release      HPI: Patient is a 71 year old gentleman 2 months status post release A1 pulley right thumb. Patient denies any symptoms she states that the finger range of motion continues to improve.  Assessment & Plan: Visit Diagnoses:  1. Trigger thumb, right thumb     Plan: Continue scar massage. Recommended continue range of motion for the Dupuytren's contracture to the ring and little finger.  Follow-Up Instructions: Return if symptoms worsen or fail to improve.   Ortho Exam  Patient is alert, oriented, no adenopathy, well-dressed, normal affect, normal respiratory effort. Examination patient has good range of motion of the right thumb there is no redness no cellulitis he does have some scar tissue at the surgical site and scar massage was recommended.  Imaging: No results found. No images are attached to the encounter.  Labs: Lab Results  Component Value Date   HGBA1C 8.6 (H) 12/10/2016   HGBA1C 9.8 (H) 09/03/2016   HGBA1C 8.3 (H) 04/17/2016    Orders:  No orders of the defined types were placed in this encounter.  No orders of the defined types were placed in this encounter.    Procedures: No procedures performed  Clinical Data: No additional findings.  ROS:  All other systems negative, except as noted in the HPI. Review of Systems  Objective: Vital Signs: There were no vitals taken for this visit.  Specialty Comments:  No specialty comments available.  PMFS History: Patient Active Problem List   Diagnosis Date Noted  . Dupuytrens contracture 01/07/2017  . Trigger  thumb, right thumb 12/17/2016  . Diabetes type 2, uncontrolled ([place]) 04/17/2016  . Essential hypertension 03/06/2016  . Chronic venous insufficiency 05/31/2014   Past Medical History:  Diagnosis Date  . Cancer [facility])    prostate  . Diabetes mellitus without complication ([place])   . Essential hypertension 03/06/2016  . GERD (gastroesophageal reflux disease)   . Hyperlipidemia   . Sleep apnea    sleeps with BPAP machine    Family History  Problem Relation Age of Onset  . Hypertension Mother   . Hypertension Father     Past Surgical History:  Procedure Laterality Date  . nose and throat surgery     sleep apnes  . SHOULDER SURGERY    . TONSILLECTOMY     Social History   Occupational History  . Not on file.   Social History Main Topics  . Smoking status: Former Smoker    Quit date: 06/01/1979  . Smokeless tobacco: Never Used  . Alcohol use 0.0 oz/week  . Drug use: No  . Sexual activity: Not on file

## 2017-02-10 ENCOUNTER — Other Ambulatory Visit: Payer: Self-pay | Admitting: Family Medicine

## 2017-02-17 ENCOUNTER — Other Ambulatory Visit: Payer: Self-pay | Admitting: Family Medicine

## 2017-02-17 DIAGNOSIS — E119 Type 2 diabetes mellitus without complications: Secondary | ICD-10-CM

## 2017-02-18 ENCOUNTER — Encounter: Payer: Self-pay | Admitting: Family Medicine

## 2017-02-19 ENCOUNTER — Other Ambulatory Visit: Payer: Self-pay | Admitting: Family Medicine

## 2017-02-19 MED ORDER — PIOGLITAZONE HCL 30 MG PO TABS: 30.0000 mg | ORAL_TABLET | Freq: Every day | ORAL | 2 refills | Status: DC

## 2017-02-19 NOTE — Progress Notes (Signed)
Actos called in, Januvia stopped 2/2 cost.

## 2017-03-11 ENCOUNTER — Ambulatory Visit (INDEPENDENT_AMBULATORY_CARE_PROVIDER_SITE_OTHER): Payer: Medicare HMO | Admitting: Family Medicine

## 2017-03-11 VITALS — BP 124/82 | HR 79 | Temp 98.2°F | Ht >= 80 in | Wt 282.2 lb

## 2017-03-11 DIAGNOSIS — E1165 Type 2 diabetes mellitus with hyperglycemia: Secondary | ICD-10-CM

## 2017-03-11 DIAGNOSIS — E785 Hyperlipidemia, unspecified: Secondary | ICD-10-CM | POA: Diagnosis not present

## 2017-03-11 DIAGNOSIS — Z23 Encounter for immunization: Secondary | ICD-10-CM | POA: Diagnosis not present

## 2017-03-11 DIAGNOSIS — I10 Essential (primary) hypertension: Secondary | ICD-10-CM

## 2017-03-11 LAB — HEMOGLOBIN A1C: Hgb A1c MFr Bld: 10.6 % — ABNORMAL HIGH (ref 4.6–6.5)

## 2017-03-11 NOTE — Progress Notes (Signed)
Subjective:   Chief Complaint  Patient presents with  . Diabetes    followup    Ronald Barr is a 71 y.o. male here for follow-up of diabetes.   Tracen's self monitored glucose range is > 200.  Patient denies hypoglycemic reactions. He checks his glucose levels 2 times per week. Patient does not require insulin.   Medications include: Metformin, glipizide, actos He exercises 2 days per week on average- he just joined a gym.   He does take an aspirin daily. Statin? Has been intolerant on multiple ones  Hypertension Patient presents for hypertension follow up. He does monitor home blood pressures. He is compliant with medications. Patient has these side effects of medication: none He is adhering to a healthy diet overall. Exercise: just joined [facility] [employer] [address]  Hyperlipidemia Patient presents for hyperlidemia follow up. Diet is poor.  Exercising picking up. He has failed multiple statins due to myalgias.   Past Medical History:  Diagnosis Date  . Cancer [facility])    prostate  . Diabetes mellitus without complication ([place])   . Essential hypertension 03/06/2016  . GERD (gastroesophageal reflux disease)   . Hyperlipidemia   . Sleep apnea    sleeps with BPAP machine    Past Surgical History:  Procedure Laterality Date  . nose and throat surgery     sleep apnes  . SHOULDER SURGERY    . TONSILLECTOMY      Social History   Social History  . Marital status: Married   Social History Main Topics  . Smoking status: Former Smoker    Quit date: 06/01/1979  . Smokeless tobacco: Never Used  . Alcohol use 0.0 oz/week  . Drug use: No   Current Outpatient Prescriptions on File Prior to Visit  Medication Sig Dispense Refill  . amLODipine (NORVASC) 10 MG tablet TAKE ONE TABLET BY MOUTH DAILY 90 tablet 0  . aspirin 81 MG tablet Take 81 mg by mouth daily.    . fenofibrate (TRICOR) 145 MG tablet Take 1 tablet (145 mg total) by mouth daily. 90 tablet 1  . glipiZIDE (GLUCOTROL XL)  10 MG 24 hr tablet Take 2 tablets (20 mg total) by mouth daily. 180 tablet 1  . metFORMIN (GLUCOPHAGE) 850 MG tablet TAKE ONE TABLET BY MOUTH THREE TIMES A DAY 270 tablet 0  . omeprazole (PRILOSEC) 20 MG capsule Take 1 capsule (20 mg total) by mouth daily. 90 capsule 3  . pioglitazone (ACTOS) 30 MG tablet Take 1 tablet (30 mg total) by mouth daily. 30 tablet 2   Related testing: Date of retinal exam: 05/2016  Done by:  Dr. Bing Plume Pneumovax: done Flu Shot: done  Review of Systems: Eye:  No recent significant change in vision Pulmonary:  No SOB Cardiovascular:  No chest pain, no palpitations Skin/Integumentary ROS:  No abnormal skin lesions reported Neurologic:  No numbness, tingling  Objective:  BP 124/82 (BP Location: Left Arm, Patient Position: Sitting, Cuff Size: Normal)   Pulse 79   Temp 98.2 F (36.8 C) (Oral)   Ht 6\' 8"  (2.032 m)   Wt 282 lb 4 oz (128 kg)   SpO2 95%   BMI 31.01 kg/m  General:  Well developed, well nourished, in no apparent distress Skin:  Warm, no pallor or diaphoresis Head:  Normocephalic, atraumatic Eyes:  Pupils equal and round, sclera anicteric without injection  Nose:  External nares without trauma, no discharge Throat/Pharynx:  Lips and gingiva without lesion Neck: Neck supple.  No obvious  thyromegaly or masses.  No bruits Lungs:  clear to auscultation, breath sounds equal bilaterally, no wheezes, rales, or stridor Cardio:  regular rate and rhythm without murmurs, no bruits, no LE edema noted today Psych: Age appropriate judgment and insight  Assessment:   Uncontrolled type 2 diabetes mellitus with hyperglycemia (HCC) - Plan: Hemoglobin A1c  Essential hypertension  Need for influenza vaccination - Plan: Flu vaccine HIGH DOSE PF (Fluzone High dose)  Hyperlipidemia, unspecified hyperlipidemia type   Plan:   Orders as above. I fear he may be a type 1 convert, discussed and taught insulin administration today should we decide to do it. He  understands and is in agreement.  I will be in contact with him regarding future management via MyChart and based on today's A1c.  F/u in 3 mo. The patient voiced understanding and agreement to the plan.  [place], DO 03/11/17 9:28 AM

## 2017-03-11 NOTE — Patient Instructions (Addendum)
Clean up the diet a bit.   I will let you know via MyChart what the next steps are.   Let us know if you need anything.   You will not need to be fasting at your next appointment.   Healthy Eating Plan Many factors influence your heart health, including eating and exercise habits. Heart (coronary) risk increases with abnormal blood fat (lipid) levels. Heart-healthy meal planning includes limiting unhealthy fats, increasing healthy fats, and making other small dietary changes. This includes maintaining a healthy body weight to help keep lipid levels within a normal range.  WHAT IS MY PLAN?  Your health care provider recommends that you:  Drink a glass of water before meals to help with satiety.  Eat slowly.  An alternative to the water is to add Metamucil. This will help with satiety as well. It does contain calories, unlike water.  WHAT TYPES OF FAT SHOULD I CHOOSE?  Choose healthy fats more often. Choose monounsaturated and polyunsaturated fats, such as olive oil and canola oil, flaxseeds, walnuts, almonds, and seeds.  Eat more omega-3 fats. Good choices include salmon, mackerel, sardines, tuna, flaxseed oil, and ground flaxseeds. Aim to eat fish at least two times each week.  Avoid foods with partially hydrogenated oils in them. These contain trans fats. Examples of foods that contain trans fats are stick margarine, some tub margarines, cookies, crackers, and other baked goods. If you are going to avoid a fat, this is the one to avoid!  WHAT GENERAL GUIDELINES DO I NEED TO FOLLOW?  Check food labels carefully to identify foods with trans fats. Avoid these types of options when possible.  Fill one half of your plate with vegetables and green salads. Eat 4-5 servings of vegetables per day. A serving of vegetables equals 1 cup of raw leafy vegetables,  cup of raw or cooked cut-up vegetables, or  cup of vegetable juice.  Fill one fourth of your plate with whole grains. Look for the  word "whole" as the first word in the ingredient list.  Fill one fourth of your plate with lean protein foods.  Eat 4-5 servings of fruit per day. A serving of fruit equals one medium whole fruit,  cup of dried fruit,  cup of fresh, frozen, or canned fruit. Try to avoid fruits in cups/syrups as the sugar content can be high.  Eat more foods that contain soluble fiber. Examples of foods that contain this type of fiber are apples, broccoli, carrots, beans, peas, and barley. Aim to get 20-30 g of fiber per day.  Eat more home-cooked food and less restaurant, buffet, and fast food.  Limit or avoid alcohol.  Limit foods that are high in starch and sugar.  Avoid fried foods when able.  Cook foods by using methods other than frying. Baking, boiling, grilling, and broiling are all great options. Other fat-reducing suggestions include: ? Removing the skin from poultry. ? Removing all visible fats from meats. ? Skimming the fat off of stews, soups, and gravies before serving them. ? Steaming vegetables in water or broth.  Lose weight if you are overweight. Losing just 5-10% of your initial body weight can help your overall health and prevent diseases such as diabetes and heart disease.  Increase your consumption of nuts, legumes, and seeds to 4-5 servings per week. One serving of dried beans or legumes equals  cup after being cooked, one serving of nuts equals 1 ounces, and one serving of seeds equals  ounce or 1 tablespoon.  WHAT ARE GOOD FOODS CAN I EAT? Grains Grainy breads (try to find bread that is 3 g of fiber per slice or greater), oatmeal, light popcorn. Whole-grain cereals. Rice and pasta, including brown rice and those that are made with whole wheat. Edamame pasta is a great alternative to grain pasta. It has a higher protein content. Try to avoid significant consumption of white bread, sugary cereals, or pastries/baked goods.  Vegetables All vegetables. Cooked white potatoes do  not count as vegetables.  Fruits All fruits, but limit pineapple and bananas as these fruits have a higher sugar content.  Meats and Other Protein Sources Lean, well-trimmed beef, veal, pork, and lamb. Chicken and [place] without skin. All fish and shellfish. Wild duck, rabbit, pheasant, and venison. Egg whites or low-cholesterol egg substitutes. Dried beans, peas, lentils, and tofu.Seeds and most nuts.  Dairy Low-fat or nonfat cheeses, including ricotta, string, and mozzarella. Skim or 1% milk that is liquid, powdered, or evaporated. Buttermilk that is made with low-fat milk. Nonfat or low-fat yogurt. Soy/Almond milk are good alternatives if you cannot handle dairy.  Beverages Water is the best for you. Sports drinks with less sugar are more desirable unless you are a highly active athlete.  Sweets and Desserts Sherbets and fruit ices. Honey, jam, marmalade, jelly, and syrups. Dark chocolate.  Eat all sweets and desserts in moderation.  Fats and Oils Nonhydrogenated (trans-free) margarines. Vegetable oils, including soybean, sesame, sunflower, olive, peanut, safflower, corn, canola, and cottonseed. Salad dressings or mayonnaise that are made with a vegetable oil. Limit added fats and oils that you use for cooking, baking, salads, and as spreads.  Other Cocoa powder. Coffee and tea. Most condiments.  The items listed above may not be a complete list of recommended foods or beverages. Contact your dietitian for more options.

## 2017-03-11 NOTE — Progress Notes (Signed)
Pre visit review using our clinic review tool, if applicable. No additional management support is needed unless otherwise documented below in the visit note. 

## 2017-03-12 ENCOUNTER — Other Ambulatory Visit: Payer: Self-pay | Admitting: Family Medicine

## 2017-03-12 DIAGNOSIS — R69 Illness, unspecified: Secondary | ICD-10-CM | POA: Diagnosis not present

## 2017-03-12 MED ORDER — INSULIN GLARGINE 100 UNIT/ML SOLOSTAR PEN: 15.0000 [IU] | PEN_INJECTOR | Freq: Every day | SUBCUTANEOUS | 1 refills | Status: DC

## 2017-03-12 MED ORDER — INSULIN PEN NEEDLE 32G X 4 MM MISC: 11 refills | Status: DC

## 2017-03-12 MED ORDER — GLUCOSE BLOOD VI STRP: ORAL_STRIP | 12 refills | Status: DC

## 2017-03-12 MED ORDER — LANCETS MISC: 11 refills | Status: DC

## 2017-03-12 NOTE — Progress Notes (Signed)
Starting 15 u nightly of Lantus.

## 2017-03-12 NOTE — Addendum Note (Signed)
Addended by: Sharon Seller B on: 03/12/2017 04:04 PM   Modules accepted: Orders

## 2017-03-12 NOTE — Progress Notes (Signed)
Lowella Grip at [facility] [phone] needs an order for pen needles for the Lantus.  Also the pt has informed them they were to also get a new meter and testing strips. Please order these.

## 2017-03-12 NOTE — Progress Notes (Signed)
Called the pharmacy/patient and did get supplies sent in needed. Patient will pickup meter here. Sent in strips/lancets/pen needles. Patient is aware

## 2017-03-25 ENCOUNTER — Encounter: Payer: Self-pay | Admitting: Family Medicine

## 2017-03-25 ENCOUNTER — Other Ambulatory Visit: Payer: Self-pay | Admitting: Family Medicine

## 2017-03-25 NOTE — Progress Notes (Signed)
Insulin dosing updated to 18 u from 15. Pt notified via [place].

## 2017-03-30 ENCOUNTER — Encounter: Payer: Self-pay | Admitting: Family Medicine

## 2017-03-31 ENCOUNTER — Ambulatory Visit: Payer: Medicare HMO | Admitting: Family Medicine

## 2017-03-31 ENCOUNTER — Encounter: Payer: Self-pay | Admitting: Family Medicine

## 2017-03-31 VITALS — BP 128/80 | HR 73 | Temp 98.1°F | Ht >= 80 in | Wt 273.0 lb

## 2017-03-31 DIAGNOSIS — J189 Pneumonia, unspecified organism: Secondary | ICD-10-CM

## 2017-03-31 DIAGNOSIS — J181 Lobar pneumonia, unspecified organism: Secondary | ICD-10-CM | POA: Diagnosis not present

## 2017-03-31 MED ORDER — BENZONATATE 100 MG PO CAPS: 100.0000 mg | ORAL_CAPSULE | Freq: Three times a day (TID) | ORAL | 0 refills | Status: DC | PRN

## 2017-03-31 MED ORDER — DOXYCYCLINE HYCLATE 100 MG PO TABS: 100.0000 mg | ORAL_TABLET | Freq: Two times a day (BID) | ORAL | 0 refills | Status: AC

## 2017-03-31 NOTE — Progress Notes (Signed)
Pre visit review using our clinic review tool, if applicable. No additional management support is needed unless otherwise documented below in the visit note. 

## 2017-03-31 NOTE — Progress Notes (Signed)
Chief Complaint  Patient presents with  . Cough    congestion    Su Ley here for URI complaints.  Duration: 2 weeks  Associated symptoms: sore throat, shortness of breath, chest pain and productive cough Denies: sinus congestion, sinus pain, rhinorrhea, itchy watery eyes, ear pain, ear drainage, myalgia and fevers/rigors  Treatment to date: Alka-Seltzer, Delsum, Dayquil and Nyquil Sick contacts: No  ROS:  Const: Denies fevers HEENT: As noted in HPI Lungs: +SOB  Past Medical History:  Diagnosis Date  . Cancer [facility])    prostate  . Diabetes mellitus without complication ([place])   . Essential hypertension 03/06/2016  . GERD (gastroesophageal reflux disease)   . Hyperlipidemia   . Sleep apnea    sleeps with BPAP machine   Family History  Problem Relation Age of Onset  . Hypertension Mother   . Hypertension Father     BP 128/80 (BP Location: Left Arm, Patient Position: Sitting, Cuff Size: Large)   Pulse 73   Temp 98.1 F (36.7 C) (Oral)   Ht 6\' 8"  (2.032 m)   Wt 273 lb (123.8 kg)   SpO2 96%   BMI 29.99 kg/m  General: Awake, alert, appears stated age HEENT: AT, [place], ears patent b/l and TM's neg, nares patent w/o discharge, pharynx pink and without exudates, MMM Neck: No masses or asymmetry Heart: RRR, no murmurs, no bruits Lungs: Rales heard in RLL base correlating with his pain, no accessory muscle use Psych: Age appropriate judgment and insight, normal mood and affect  Community acquired pneumonia of right lower lobe of lung ([place])  Orders as above. Doxy, Tessalon Perles.  Continue to push fluids, practice good hand hygiene, cover mouth when coughing. F/u prn. If starting to experience fevers, shaking, or shortness of breath, seek immediate care. Pt voiced understanding and agreement to the plan.  [place], DO 03/31/17 10:18 AM

## 2017-03-31 NOTE — Patient Instructions (Addendum)
Continue to push fluids, practice good hand hygiene, and cover your mouth if you cough.  If you start having fevers, shaking or shortness of breath, seek immediate care.  Let us know if you need anything.  

## 2017-04-13 ENCOUNTER — Encounter: Payer: Self-pay | Admitting: Family Medicine

## 2017-04-13 ENCOUNTER — Other Ambulatory Visit: Payer: Self-pay | Admitting: Family Medicine

## 2017-04-13 NOTE — Progress Notes (Signed)
Insulin changed to 21 u nightly. Pt notified after sending in readings.

## 2017-04-14 ENCOUNTER — Encounter: Payer: Self-pay | Admitting: Family Medicine

## 2017-04-28 ENCOUNTER — Other Ambulatory Visit: Payer: Self-pay | Admitting: Family Medicine

## 2017-04-28 DIAGNOSIS — E119 Type 2 diabetes mellitus without complications: Secondary | ICD-10-CM

## 2017-05-07 ENCOUNTER — Other Ambulatory Visit: Payer: Self-pay | Admitting: Family Medicine

## 2017-05-07 ENCOUNTER — Encounter: Payer: Self-pay | Admitting: Family Medicine

## 2017-05-07 NOTE — Progress Notes (Signed)
Increase dose of insulin to 24 units nightly.

## 2017-05-10 ENCOUNTER — Other Ambulatory Visit: Payer: Self-pay | Admitting: Family Medicine

## 2017-05-13 ENCOUNTER — Other Ambulatory Visit: Payer: Self-pay | Admitting: Family Medicine

## 2017-05-14 ENCOUNTER — Other Ambulatory Visit: Payer: Self-pay | Admitting: Family Medicine

## 2017-05-14 DIAGNOSIS — E119 Type 2 diabetes mellitus without complications: Secondary | ICD-10-CM

## 2017-05-17 ENCOUNTER — Encounter: Payer: Self-pay | Admitting: Family Medicine

## 2017-05-19 ENCOUNTER — Other Ambulatory Visit: Payer: Self-pay | Admitting: Family Medicine

## 2017-05-19 MED ORDER — INSULIN GLARGINE 300 UNIT/ML ~~LOC~~ SOPN: 24.0000 [IU] | PEN_INJECTOR | Freq: Every day | SUBCUTANEOUS | 1 refills | Status: DC

## 2017-05-20 ENCOUNTER — Other Ambulatory Visit: Payer: Self-pay | Admitting: Family Medicine

## 2017-05-27 DIAGNOSIS — R69 Illness, unspecified: Secondary | ICD-10-CM | POA: Diagnosis not present

## 2017-06-01 ENCOUNTER — Other Ambulatory Visit: Payer: Self-pay | Admitting: Family Medicine

## 2017-06-01 DIAGNOSIS — IMO0001 Reserved for inherently not codable concepts without codable children: Secondary | ICD-10-CM

## 2017-06-01 DIAGNOSIS — E1165 Type 2 diabetes mellitus with hyperglycemia: Principal | ICD-10-CM

## 2017-06-12 DIAGNOSIS — R69 Illness, unspecified: Secondary | ICD-10-CM | POA: Diagnosis not present

## 2017-06-17 ENCOUNTER — Encounter: Payer: Self-pay | Admitting: Family Medicine

## 2017-06-17 ENCOUNTER — Ambulatory Visit (INDEPENDENT_AMBULATORY_CARE_PROVIDER_SITE_OTHER): Payer: Medicare HMO | Admitting: Family Medicine

## 2017-06-17 VITALS — BP 118/78 | HR 76 | Temp 97.6°F | Ht >= 80 in | Wt 281.5 lb

## 2017-06-17 DIAGNOSIS — E1165 Type 2 diabetes mellitus with hyperglycemia: Secondary | ICD-10-CM | POA: Diagnosis not present

## 2017-06-17 LAB — HEMOGLOBIN A1C: HEMOGLOBIN A1C: 8 % — AB (ref 4.6–6.5)

## 2017-06-17 LAB — MICROALBUMIN / CREATININE URINE RATIO
CREATININE, U: 174.9 mg/dL
MICROALB UR: 4.3 mg/dL — AB (ref 0.0–1.9)
MICROALB/CREAT RATIO: 2.5 mg/g (ref 0.0–30.0)

## 2017-06-17 NOTE — Progress Notes (Signed)
Subjective:   Chief Complaint  Patient presents with  . Follow-up    Ronald Barr is a 72 y.o. male here for follow-up of diabetes.   Ronald Barr's self monitored glucose range is mid-high 100's, the lowest they have ever been. Patient denies hypoglycemic reactions. He checks his glucose levels 1 time per day. Patient does require insulin- taking 24 unit Toujeo/day. Medications include: Metformin 850 mg TID, Actos 30 mg/d, Glipizide 10 mg BID Patient exercises 0 days per week on average.   Statin? Yes  Past Medical History:  Diagnosis Date  . Cancer [facility])    prostate  . Diabetes mellitus without complication ([place])   . Essential hypertension 03/06/2016  . GERD (gastroesophageal reflux disease)   . Hyperlipidemia   . Sleep apnea    sleeps with BPAP machine     Related testing: Foot exam(monofilament and inspection):done Date of retinal exam: 05/2017  Done by:  Dr. Bing Plume Pneumovax: done Flu Shot: done  Review of Systems: Eye:  No recent significant change in vision Pulmonary:  No SOB Cardiovascular:  No chest pain, no palpitations Skin/Integumentary ROS:  No abnormal skin lesions reported Neurologic:  No numbness, tingling  Objective:  BP 118/78 (BP Location: Left Arm, Patient Position: Sitting, Cuff Size: Large)   Pulse 76   Temp 97.6 F (36.4 C) (Oral)   Ht 6\' 8"  (2.032 m)   Wt 281 lb 8 oz (127.7 kg)   SpO2 96%   BMI 30.92 kg/m  General:  Well developed, well nourished, in no apparent distress Skin:  Warm, no pallor or diaphoresis Head:  Normocephalic, atraumatic Eyes:  Pupils equal and round, sclera anicteric without injection  Nose:  External nares without trauma, no discharge Throat/Pharynx:  Lips and gingiva without lesion Neck: Neck supple.  No obvious thyromegaly or masses.  No bruits Lungs:  clear to auscultation, breath sounds equal bilaterally, no wheezes, rales, or stridor Cardio:  regular rate and rhythm without murmurs, no bruits, no LE  edema Musculoskeletal:  Symmetrical muscle groups noted without atrophy or deformity Neuro:  Sensation intact to pinprick on feet on R, decreased sensation on L Psych: Age appropriate judgment and insight  Assessment:   Uncontrolled type 2 diabetes mellitus with hyperglycemia ([place]) - Plan: Insulin Glargine (TOUJEO MAX SOLOSTAR) 300 UNIT/ML SOPN, Hemoglobin A1c, Microalbumin / creatinine urine ratio, HM Diabetes Foot Exam   Plan:   Orders as above. Cont Metformin and Actos for now, will stop SU and see how his sugars do. He will let us know if his sugars spike from this. Increase basal insulin from 24 u/night to 26 u/night. Payment cards given.  Stressed importance of reg foot care/monitoring. F/u in 3 mo. The patient voiced understanding and agreement to the plan.  [place], DO 06/17/17 11:11 AM

## 2017-06-17 NOTE — Patient Instructions (Signed)
Send me a message in the next few days if your sugars spike after stopping glipizide.  Send me a message with your sugar readings in 1 mo on the new dose.  Check your sugar when you wake up at night and feel light headed/nauseous.   Let us know if you need anything.

## 2017-06-17 NOTE — Progress Notes (Signed)
Pre visit review using our clinic review tool, if applicable. No additional management support is needed unless otherwise documented below in the visit note. 

## 2017-06-25 DIAGNOSIS — D1801 Hemangioma of skin and subcutaneous tissue: Secondary | ICD-10-CM | POA: Diagnosis not present

## 2017-06-25 DIAGNOSIS — Z85828 Personal history of other malignant neoplasm of skin: Secondary | ICD-10-CM | POA: Diagnosis not present

## 2017-06-25 DIAGNOSIS — L723 Sebaceous cyst: Secondary | ICD-10-CM | POA: Diagnosis not present

## 2017-06-25 DIAGNOSIS — D2261 Melanocytic nevi of right upper limb, including shoulder: Secondary | ICD-10-CM | POA: Diagnosis not present

## 2017-06-25 DIAGNOSIS — L820 Inflamed seborrheic keratosis: Secondary | ICD-10-CM | POA: Diagnosis not present

## 2017-06-25 DIAGNOSIS — L814 Other melanin hyperpigmentation: Secondary | ICD-10-CM | POA: Diagnosis not present

## 2017-06-25 DIAGNOSIS — L570 Actinic keratosis: Secondary | ICD-10-CM | POA: Diagnosis not present

## 2017-06-25 DIAGNOSIS — L821 Other seborrheic keratosis: Secondary | ICD-10-CM | POA: Diagnosis not present

## 2017-06-25 DIAGNOSIS — D485 Neoplasm of uncertain behavior of skin: Secondary | ICD-10-CM | POA: Diagnosis not present

## 2017-07-05 ENCOUNTER — Other Ambulatory Visit: Payer: Self-pay | Admitting: Family Medicine

## 2017-07-05 ENCOUNTER — Encounter: Payer: Self-pay | Admitting: Family Medicine

## 2017-07-05 DIAGNOSIS — E1165 Type 2 diabetes mellitus with hyperglycemia: Principal | ICD-10-CM

## 2017-07-05 DIAGNOSIS — IMO0001 Reserved for inherently not codable concepts without codable children: Secondary | ICD-10-CM

## 2017-07-11 ENCOUNTER — Encounter: Payer: Self-pay | Admitting: Family Medicine

## 2017-07-12 ENCOUNTER — Other Ambulatory Visit: Payer: Self-pay | Admitting: Family Medicine

## 2017-07-12 ENCOUNTER — Encounter: Payer: Self-pay | Admitting: Family Medicine

## 2017-07-12 MED ORDER — INSULIN DEGLUDEC 100 UNIT/ML ~~LOC~~ SOPN: 26.0000 [IU] | PEN_INJECTOR | Freq: Every day | SUBCUTANEOUS | 1 refills | Status: DC

## 2017-07-13 MED ORDER — INSULIN PEN NEEDLE 32G X 6 MM MISC: 1 refills | Status: DC

## 2017-07-23 ENCOUNTER — Telehealth: Payer: Self-pay | Admitting: Family Medicine

## 2017-07-23 NOTE — Telephone Encounter (Signed)
Spoke with Ronald Barr he stated that he is not interested in scheduling and annual wellness visit at this time. SF

## 2017-07-24 ENCOUNTER — Other Ambulatory Visit: Payer: Self-pay | Admitting: Family Medicine

## 2017-07-24 DIAGNOSIS — E119 Type 2 diabetes mellitus without complications: Secondary | ICD-10-CM

## 2017-08-02 DIAGNOSIS — L988 Other specified disorders of the skin and subcutaneous tissue: Secondary | ICD-10-CM | POA: Diagnosis not present

## 2017-08-02 DIAGNOSIS — D485 Neoplasm of uncertain behavior of skin: Secondary | ICD-10-CM | POA: Diagnosis not present

## 2017-08-02 DIAGNOSIS — Z85828 Personal history of other malignant neoplasm of skin: Secondary | ICD-10-CM | POA: Diagnosis not present

## 2017-08-07 ENCOUNTER — Other Ambulatory Visit: Payer: Self-pay | Admitting: Family Medicine

## 2017-08-09 ENCOUNTER — Other Ambulatory Visit: Payer: Self-pay | Admitting: Family Medicine

## 2017-08-09 DIAGNOSIS — E119 Type 2 diabetes mellitus without complications: Secondary | ICD-10-CM

## 2017-08-26 ENCOUNTER — Other Ambulatory Visit: Payer: Self-pay | Admitting: Family Medicine

## 2017-08-26 DIAGNOSIS — E1165 Type 2 diabetes mellitus with hyperglycemia: Principal | ICD-10-CM

## 2017-08-26 DIAGNOSIS — IMO0001 Reserved for inherently not codable concepts without codable children: Secondary | ICD-10-CM

## 2017-08-26 DIAGNOSIS — K219 Gastro-esophageal reflux disease without esophagitis: Secondary | ICD-10-CM

## 2017-09-16 ENCOUNTER — Encounter: Payer: Self-pay | Admitting: Family Medicine

## 2017-09-16 ENCOUNTER — Other Ambulatory Visit: Payer: Self-pay | Admitting: Family Medicine

## 2017-09-16 ENCOUNTER — Ambulatory Visit (INDEPENDENT_AMBULATORY_CARE_PROVIDER_SITE_OTHER): Payer: Medicare HMO | Admitting: Family Medicine

## 2017-09-16 VITALS — BP 122/78 | HR 74 | Temp 98.0°F | Ht >= 80 in | Wt 293.5 lb

## 2017-09-16 DIAGNOSIS — E669 Obesity, unspecified: Secondary | ICD-10-CM

## 2017-09-16 DIAGNOSIS — E1169 Type 2 diabetes mellitus with other specified complication: Secondary | ICD-10-CM | POA: Insufficient documentation

## 2017-09-16 LAB — HEMOGLOBIN A1C: HEMOGLOBIN A1C: 7.8 % — AB (ref 4.6–6.5)

## 2017-09-16 LAB — COMPREHENSIVE METABOLIC PANEL
ALBUMIN: 4.1 g/dL (ref 3.5–5.2)
ALK PHOS: 36 U/L — AB (ref 39–117)
ALT: 22 U/L (ref 0–53)
AST: 22 U/L (ref 0–37)
BUN: 22 mg/dL (ref 6–23)
CALCIUM: 9.7 mg/dL (ref 8.4–10.5)
CHLORIDE: 105 meq/L (ref 96–112)
CO2: 29 mEq/L (ref 19–32)
Creatinine, Ser: 1.32 mg/dL (ref 0.40–1.50)
GFR: 56.89 mL/min — AB (ref >60.00)
Glucose, Bld: 147 mg/dL — ABNORMAL HIGH (ref 70–99)
POTASSIUM: 4.3 meq/L (ref 3.5–5.1)
Sodium: 140 mEq/L (ref 135–145)
TOTAL PROTEIN: 7 g/dL (ref 6.0–8.3)
Total Bilirubin: 0.5 mg/dL (ref 0.2–1.2)

## 2017-09-16 LAB — LIPID PANEL
CHOL/HDL RATIO: 5
Cholesterol: 176 mg/dL (ref 0–200)
HDL: 38.2 mg/dL — AB (ref >39.00)
LDL CALC: 117 mg/dL — AB (ref 0–99)
NONHDL: 137.48
Triglycerides: 100 mg/dL (ref 0.0–149.0)
VLDL: 20 mg/dL (ref 0.0–40.0)

## 2017-09-16 NOTE — Patient Instructions (Addendum)
When you find a new eye provider, please have them send their note to [place].  Keep the diet clean.  We will be in touch regarding your lab results.   Let us know if you need anything.

## 2017-09-16 NOTE — Progress Notes (Signed)
Pre visit review using our clinic review tool, if applicable. No additional management support is needed unless otherwise documented below in the visit note. 

## 2017-09-16 NOTE — Progress Notes (Signed)
Subjective:   Chief Complaint  Patient presents with  . Follow-up    Diabetes    Ronald Barr is a 72 y.o. male here for follow-up of diabetes.   [place]'s self monitored glucose range is mid 100's. Patient denies hypoglycemic reactions. He checks his glucose levels 1 time per day. Patient does require insulin.   Medications include: Glucotrol 20 mg/d, Metformin 850 mg tid, actos 30 mg/d, and Tresiba 26 u/night Reports diet is generally healthy overall.  Patient exercises 0 days per week on average.   Due for eye exam, knows he needs to establish with new one.   Past Medical History:  Diagnosis Date  . Cancer [facility])    prostate  . Diabetes mellitus without complication ([place])   . Essential hypertension 03/06/2016  . GERD (gastroesophageal reflux disease)   . Hyperlipidemia   . Sleep apnea    sleeps with BPAP machine    Related testing: Date of retinal exam: >1 yr  Done by:  Dr. Bing Plume Pneumovax: done Flu Shot: done  Review of Systems: Pulmonary:  No SOB Cardiovascular:  No chest pain  Objective:  BP 122/78 (BP Location: Left Arm, Patient Position: Sitting, Cuff Size: Large)   Pulse 74   Temp 98 F (36.7 C) (Oral)   Ht 6\' 8"  (2.032 m)   Wt 293 lb 8 oz (133.1 kg)   SpO2 93%   BMI 32.24 kg/m  General:  Well developed, well nourished, in no apparent distress Skin:  Warm, no pallor or diaphoresis Eyes:  Pupils equal and round, sclera anicteric without injection  Lungs:  CTAB, no access msc use Cardio:  RRR, no bruits Psych: Age appropriate judgment and insight  Assessment:   Diabetes mellitus type 2 in obese (H[facility]) - Plan: Hemoglobin A1c, Lipid panel, Comprehensive metabolic panel   Plan:   Ck above, sugars are better than they have ever been. Goal is <8, I will plan on him being below this given his readings. F/u in 6 mo. The patient voiced understanding and agreement to the plan.  [place], DO 09/16/17 11:11 AM

## 2017-09-18 DIAGNOSIS — R69 Illness, unspecified: Secondary | ICD-10-CM | POA: Diagnosis not present

## 2017-09-20 DIAGNOSIS — M7702 Medial epicondylitis, left elbow: Secondary | ICD-10-CM | POA: Diagnosis not present

## 2017-09-20 DIAGNOSIS — M25522 Pain in left elbow: Secondary | ICD-10-CM | POA: Diagnosis not present

## 2017-09-30 DIAGNOSIS — R69 Illness, unspecified: Secondary | ICD-10-CM | POA: Diagnosis not present

## 2017-10-23 ENCOUNTER — Other Ambulatory Visit: Payer: Self-pay | Admitting: Family Medicine

## 2017-10-23 DIAGNOSIS — E119 Type 2 diabetes mellitus without complications: Secondary | ICD-10-CM

## 2017-11-15 ENCOUNTER — Telehealth: Payer: Self-pay | Admitting: Family Medicine

## 2017-11-15 MED ORDER — INSULIN DEGLUDEC 100 UNIT/ML ~~LOC~~ SOPN: PEN_INJECTOR | SUBCUTANEOUS | 0 refills | Status: DC

## 2017-11-15 MED ORDER — PIOGLITAZONE HCL 30 MG PO TABS: 30.0000 mg | ORAL_TABLET | Freq: Every day | ORAL | 0 refills | Status: DC

## 2017-11-15 MED ORDER — AMLODIPINE BESYLATE 10 MG PO TABS: 10.0000 mg | ORAL_TABLET | Freq: Every day | ORAL | 0 refills | Status: DC

## 2017-11-15 NOTE — Telephone Encounter (Signed)
Copied from [place] [id number]. Topic: Quick Communication - Rx Refill/Question >> Nov 15, 2017 10:04 AM Scherrie Gerlach wrote: Medication: amLODipine (NORVASC) 10 MG tablet pioglitazone (ACTOS) 30 MG tablet TRESIBA FLEXTOUCH 100 UNIT/ML SOPN FlexTouch Pen  Pt states [employer] has been sending since last week with no response. Kristopher Oppenheim [place], [place] - [address] [phone] (Phone) [phone] (Fax)

## 2017-12-02 ENCOUNTER — Other Ambulatory Visit: Payer: Self-pay | Admitting: Family Medicine

## 2017-12-02 DIAGNOSIS — E119 Type 2 diabetes mellitus without complications: Secondary | ICD-10-CM

## 2017-12-02 MED ORDER — METFORMIN HCL 850 MG PO TABS: 850.0000 mg | ORAL_TABLET | Freq: Three times a day (TID) | ORAL | 1 refills | Status: DC

## 2017-12-09 ENCOUNTER — Ambulatory Visit: Payer: Medicare HMO | Admitting: Podiatry

## 2017-12-10 ENCOUNTER — Ambulatory Visit (INDEPENDENT_AMBULATORY_CARE_PROVIDER_SITE_OTHER): Payer: Medicare HMO | Admitting: Podiatry

## 2017-12-10 ENCOUNTER — Encounter: Payer: Self-pay | Admitting: Podiatry

## 2017-12-10 VITALS — BP 143/91 | HR 89 | Resp 16

## 2017-12-10 DIAGNOSIS — E1149 Type 2 diabetes mellitus with other diabetic neurological complication: Secondary | ICD-10-CM | POA: Diagnosis not present

## 2017-12-10 DIAGNOSIS — M2042 Other hammer toe(s) (acquired), left foot: Secondary | ICD-10-CM

## 2017-12-10 DIAGNOSIS — M79674 Pain in right toe(s): Secondary | ICD-10-CM

## 2017-12-10 DIAGNOSIS — B351 Tinea unguium: Secondary | ICD-10-CM

## 2017-12-10 DIAGNOSIS — Q828 Other specified congenital malformations of skin: Secondary | ICD-10-CM | POA: Diagnosis not present

## 2017-12-10 DIAGNOSIS — M216X9 Other acquired deformities of unspecified foot: Secondary | ICD-10-CM

## 2017-12-10 DIAGNOSIS — M2041 Other hammer toe(s) (acquired), right foot: Secondary | ICD-10-CM | POA: Diagnosis not present

## 2017-12-10 DIAGNOSIS — M79675 Pain in left toe(s): Secondary | ICD-10-CM | POA: Diagnosis not present

## 2017-12-10 NOTE — Progress Notes (Signed)
Subjective:    Patient ID: Ronald Barr, male    DOB: August 14, 1945, 72 y.o.   MRN: [id number]  HPI  72 year old type II diabetic male presents the office today with concerns of calluses to both of his feet for some ongoing for several years as well as for painful, elongated toenails that he cannot trim himself.  Also presents today for diabetic foot exam.  He is also asking for possible new inserts as his old inserts for over 15 years old and he does need new ones.  He does not wear cushioned shoes he gets pain to the calluses.  He is diabetic and last blood sugar this morning he reports 141.  His last A1c was 7.8.  Denies any claudication symptoms.  He does report some mild numbness and tingling.  Pain does not wake him up at night.  No other concerns.  Review of Systems  All other systems reviewed and are negative.  Past Medical History:  Diagnosis Date  . Cancer [facility])    prostate  . Diabetes mellitus without complication ([place])   . Essential hypertension 03/06/2016  . GERD (gastroesophageal reflux disease)   . Hyperlipidemia   . Sleep apnea    sleeps with BPAP machine    Past Surgical History:  Procedure Laterality Date  . nose and throat surgery     sleep apnes  . SHOULDER SURGERY    . TONSILLECTOMY       Current Outpatient Medications:  .  amLODipine (NORVASC) 10 MG tablet, Take 1 tablet (10 mg total) by mouth daily., Disp: 90 tablet, Rfl: 0 .  fenofibrate (TRICOR) 145 MG tablet, TAKE ONE TABLET BY MOUTH DAILY, Disp: 90 tablet, Rfl: 0 .  glipiZIDE (GLUCOTROL XL) 10 MG 24 hr tablet, TAKE 2 TABLETS BY MOUTH DAILY, Disp: 180 tablet, Rfl: 0 .  glucose blood (ONETOUCH VERIO) test strip, Use as instructed, Disp: 100 each, Rfl: 12 .  insulin degludec (TRESIBA FLEXTOUCH) 100 UNIT/ML SOPN FlexTouch Pen, INJECT 0.26ML UNDER THE SKIN AT BEDTIME, Disp: 15 pen, Rfl: 0 .  Insulin Pen Needle (BD PEN NEEDLE NANO U/F) 32G X 4 MM MISC, Use daily with lantus.  DX E11.9, Disp: 100 each, Rfl:  11 .  Insulin Pen Needle (NOVOFINE) 32G X 6 MM MISC, To use w/ Tresiba, Disp: 100 each, Rfl: 1 .  IRON PO, Take 1 tablet by mouth daily., Disp: , Rfl:  .  Lancets MISC, Use once daily to check blood sugar.  DXE11.9, Disp: 100 each, Rfl: 11 .  MAGNESIUM PO, Take 1 tablet by mouth 2 (two) times daily at 10 AM and 5 PM., Disp: , Rfl:  .  metFORMIN (GLUCOPHAGE) 850 MG tablet, Take 1 tablet (850 mg total) by mouth 3 (three) times daily with meals., Disp: 270 tablet, Rfl: 1 .  Multiple Vitamins-Minerals (MULTIVITAMIN PO), Take 1 tablet by mouth daily., Disp: , Rfl:  .  omeprazole (PRILOSEC) 20 MG capsule, TAKE ONE CAPSULE BY MOUTH DAILY, Disp: 90 capsule, Rfl: 1 .  pioglitazone (ACTOS) 30 MG tablet, Take 1 tablet (30 mg total) by mouth daily., Disp: 90 tablet, Rfl: 0  Allergies  Allergen Reactions  . Statins     Cramping, intolerant of Crestor and Pravastatin  . Sulfa Antibiotics          Objective:   Physical Exam General: AAO x3, NAD  Dermatological:Nails are hypertrophic, dystrophic, brittle, discolored, elongated 10. No surrounding redness or drainage. Tenderness nails 1-5 bilaterally.  Hyperkeratotic lesions bilateral  submetatarsal 1 and 5.  Upon the.  There is no underlying ulceration, drainage or any signs of infection present.  No open lesions or pre-ulcerative lesions are identified today.  Vascular: Dorsalis Pedis artery and Posterior Tibial artery pedal pulses are 2/4 bilateral with immedate capillary fill time.  There is no pain with calf compression, swelling, warmth, erythema.   Neruologic: Sensation mildly decreased to the forefoot with Derrel Nip monofilament.  Musculoskeletal: Cavus foot type is present with hammertoe deformity.  Muscular strength 5/5 in all groups tested bilateral.  Gait: Unassisted, Nonantalgic.      Assessment & Plan:  72 year old male with symptomatic onychomycosis, pre-ulcerative calluses, cavus foot type with hammertoes -Treatment options  discussed including all alternatives, risks, and complications -Etiology of symptoms were discussed -Nails debrided 10 without complications or bleeding. -Sharply debrided the hyperkeratotic lesions x4 without any complications or bleeding. -Given his history of diabetes with some neuropathy changes to the forefoot as well as his foot deformities I do recommend diabetic shoes with inserts.  Paperwork was completed today for precertification. -Daily foot inspection -Follow-up in 3 months or sooner if any problems arise. In the meantime, encouraged to call the office with any questions, concerns, change in symptoms.   Celesta Gentile, DPM

## 2017-12-20 ENCOUNTER — Telehealth: Payer: Self-pay | Admitting: Podiatry

## 2017-12-20 NOTE — Telephone Encounter (Signed)
Pt returned my call to r/s appt for 8.22 with Liliane Channel, He is out of the office.  Pt asked cost of diabetic inserts and I told him aetna mcr follows the same quide lines as medicare and should be covered at 80% and pt would have the 20% co insurance which is around $80.00 and pt has decided not to proceed with the diabetic shoes and inserts at this time.

## 2017-12-22 DIAGNOSIS — R69 Illness, unspecified: Secondary | ICD-10-CM | POA: Diagnosis not present

## 2017-12-23 ENCOUNTER — Ambulatory Visit: Payer: Medicare HMO | Admitting: Podiatry

## 2017-12-30 ENCOUNTER — Other Ambulatory Visit: Payer: Medicare HMO | Admitting: Orthotics

## 2017-12-31 ENCOUNTER — Other Ambulatory Visit: Payer: Self-pay | Admitting: Family Medicine

## 2017-12-31 DIAGNOSIS — IMO0001 Reserved for inherently not codable concepts without codable children: Secondary | ICD-10-CM

## 2017-12-31 DIAGNOSIS — E1165 Type 2 diabetes mellitus with hyperglycemia: Principal | ICD-10-CM

## 2017-12-31 MED ORDER — GLIPIZIDE ER 10 MG PO TB24: 20.0000 mg | ORAL_TABLET | Freq: Every day | ORAL | 0 refills | Status: DC

## 2018-01-13 ENCOUNTER — Other Ambulatory Visit: Payer: Self-pay | Admitting: Family Medicine

## 2018-01-13 DIAGNOSIS — M25511 Pain in right shoulder: Secondary | ICD-10-CM | POA: Diagnosis not present

## 2018-01-13 DIAGNOSIS — M7531 Calcific tendinitis of right shoulder: Secondary | ICD-10-CM | POA: Diagnosis not present

## 2018-01-13 DIAGNOSIS — M25711 Osteophyte, right shoulder: Secondary | ICD-10-CM | POA: Diagnosis not present

## 2018-01-13 DIAGNOSIS — M19011 Primary osteoarthritis, right shoulder: Secondary | ICD-10-CM | POA: Diagnosis not present

## 2018-01-13 DIAGNOSIS — M65811 Other synovitis and tenosynovitis, right shoulder: Secondary | ICD-10-CM | POA: Diagnosis not present

## 2018-01-13 MED ORDER — INSULIN DEGLUDEC 100 UNIT/ML ~~LOC~~ SOPN: PEN_INJECTOR | SUBCUTANEOUS | 2 refills | Status: DC

## 2018-01-16 DIAGNOSIS — M19011 Primary osteoarthritis, right shoulder: Secondary | ICD-10-CM | POA: Insufficient documentation

## 2018-01-16 DIAGNOSIS — M7531 Calcific tendinitis of right shoulder: Secondary | ICD-10-CM | POA: Insufficient documentation

## 2018-01-20 ENCOUNTER — Telehealth: Payer: Self-pay | Admitting: Family Medicine

## 2018-01-20 DIAGNOSIS — E119 Type 2 diabetes mellitus without complications: Secondary | ICD-10-CM

## 2018-01-20 MED ORDER — INSULIN PEN NEEDLE 32G X 4 MM MISC: 5 refills | Status: DC

## 2018-01-20 MED ORDER — FENOFIBRATE 145 MG PO TABS: 145.0000 mg | ORAL_TABLET | Freq: Every day | ORAL | 0 refills | Status: DC

## 2018-01-20 NOTE — Telephone Encounter (Signed)
Copied from [place] [id number]. Topic: Quick Communication - Rx Refill/Question >> Jan 20, 2018  3:35 PM Sallee Provencal, [place] B, NT wrote: **Patient states that he only has 2 needles left**  Medication:Insulin Pen Needle (BD PEN NEEDLE NANO U/F) 32G X 4 MM MISC fenofibrate (TRICOR) 145 MG tablet   Has the patient contacted their pharmacy? Yes.   (Agent: If no, request that the patient contact the pharmacy for the refill.) (Agent: If yes, when and what did the pharmacy advise?)  Preferred Pharmacy (with phone number or street name): [facility] [place], [place] - [address]  Agent: Please be advised that RX refills may take up to 3 business days. We ask that you follow-up with your pharmacy.

## 2018-02-08 ENCOUNTER — Other Ambulatory Visit: Payer: Self-pay | Admitting: Family Medicine

## 2018-02-09 ENCOUNTER — Other Ambulatory Visit: Payer: Self-pay | Admitting: Family Medicine

## 2018-02-24 ENCOUNTER — Ambulatory Visit (INDEPENDENT_AMBULATORY_CARE_PROVIDER_SITE_OTHER): Payer: Medicare HMO | Admitting: Family Medicine

## 2018-02-24 ENCOUNTER — Encounter: Payer: Self-pay | Admitting: Family Medicine

## 2018-02-24 VITALS — BP 140/80 | HR 80 | Temp 97.9°F | Ht >= 80 in | Wt 293.4 lb

## 2018-02-24 DIAGNOSIS — E119 Type 2 diabetes mellitus without complications: Secondary | ICD-10-CM | POA: Diagnosis not present

## 2018-02-24 DIAGNOSIS — K219 Gastro-esophageal reflux disease without esophagitis: Secondary | ICD-10-CM

## 2018-02-24 DIAGNOSIS — Z23 Encounter for immunization: Secondary | ICD-10-CM

## 2018-02-24 DIAGNOSIS — G4733 Obstructive sleep apnea (adult) (pediatric): Secondary | ICD-10-CM | POA: Diagnosis not present

## 2018-02-24 MED ORDER — METFORMIN HCL 850 MG PO TABS: 850.0000 mg | ORAL_TABLET | Freq: Three times a day (TID) | ORAL | 1 refills | Status: DC

## 2018-02-24 MED ORDER — OMEPRAZOLE 20 MG PO CPDR: 20.0000 mg | DELAYED_RELEASE_CAPSULE | Freq: Every day | ORAL | 1 refills | Status: DC

## 2018-02-24 NOTE — Patient Instructions (Addendum)
If you do not hear anything about your referral in the next 1-2 weeks, call our office and ask for an update.  Keep up the good work with your sugars.   Let us know if you need anything.

## 2018-02-24 NOTE — Progress Notes (Signed)
Chief Complaint  Patient presents with  . Follow-up    needs a Bipap    Subjective: Patient is a 72 y.o. male here for OSA.  Currently on BPAP. Does not have a sleep specialist. Current machine is 71 yrs old. Does not do well with sleep if he does not have his machine.   Sugars have been doing very well.  Usually in the mid 100s.  Not having any lows.  Compliant with medication.  Needs a few refills.   ROS: Lungs: Denies SOB   Past Medical History:  Diagnosis Date  . Cancer [facility])    prostate  . Diabetes mellitus without complication ([place])   . Essential hypertension 03/06/2016  . GERD (gastroesophageal reflux disease)   . Hyperlipidemia   . Sleep apnea    sleeps with BPAP machine    Objective: BP 140/80 (BP Location: Left Arm, Patient Position: Sitting, Cuff Size: Large)   Pulse 80   Temp 97.9 F (36.6 C) (Oral)   Ht 6\' 8"  (2.032 m)   Wt 293 lb 6 oz (133.1 kg)   SpO2 95%   BMI 32.23 kg/m  General: Awake, appears stated age HEENT: MMM, EOMi Heart: RRR Lungs: CTAB, no rales, wheezes or rhonchi. No accessory muscle use Psych: Age appropriate judgment and insight, normal affect and mood  Assessment and Plan: OSA treated with BiPAP - Plan: Ambulatory referral to Pulmonology  Type 2 diabetes mellitus without complication, without long-term current use of insulin ([place]) - Plan: metFORMIN (GLUCOPHAGE) 850 MG tablet  Need for influenza vaccination - Plan: Flu vaccine HIGH DOSE PF (Fluzone High dose)  Gastroesophageal reflux disease, esophagitis presence not specified - Plan: omeprazole (PRILOSEC) 20 MG capsule  Refer to Pulm. Refills as above.  Follow-up as originally scheduled for diabetic visit. The patient voiced understanding and agreement to the plan.  [place], DO 02/24/18  9:05 AM

## 2018-02-24 NOTE — Progress Notes (Signed)
Pre visit review using our clinic review tool, if applicable. No additional management support is needed unless otherwise documented below in the visit note. 

## 2018-03-10 ENCOUNTER — Encounter (HOSPITAL_BASED_OUTPATIENT_CLINIC_OR_DEPARTMENT_OTHER): Payer: Self-pay | Admitting: Emergency Medicine

## 2018-03-10 ENCOUNTER — Other Ambulatory Visit: Payer: Self-pay

## 2018-03-10 ENCOUNTER — Emergency Department (HOSPITAL_BASED_OUTPATIENT_CLINIC_OR_DEPARTMENT_OTHER)
Admission: EM | Admit: 2018-03-10 | Discharge: 2018-03-10 | Disposition: A | Discharge status: Home / Self Care | Payer: Medicare HMO | Attending: Emergency Medicine | Admitting: Emergency Medicine

## 2018-03-10 ENCOUNTER — Emergency Department (HOSPITAL_BASED_OUTPATIENT_CLINIC_OR_DEPARTMENT_OTHER): Payer: Medicare HMO

## 2018-03-10 DIAGNOSIS — R1031 Right lower quadrant pain: Secondary | ICD-10-CM | POA: Diagnosis present

## 2018-03-10 DIAGNOSIS — I10 Essential (primary) hypertension: Secondary | ICD-10-CM | POA: Diagnosis not present

## 2018-03-10 DIAGNOSIS — Z87891 Personal history of nicotine dependence: Secondary | ICD-10-CM | POA: Diagnosis not present

## 2018-03-10 DIAGNOSIS — Z794 Long term (current) use of insulin: Secondary | ICD-10-CM | POA: Insufficient documentation

## 2018-03-10 DIAGNOSIS — Z79899 Other long term (current) drug therapy: Secondary | ICD-10-CM | POA: Diagnosis not present

## 2018-03-10 DIAGNOSIS — N200 Calculus of kidney: Secondary | ICD-10-CM | POA: Insufficient documentation

## 2018-03-10 DIAGNOSIS — E119 Type 2 diabetes mellitus without complications: Secondary | ICD-10-CM | POA: Diagnosis not present

## 2018-03-10 DIAGNOSIS — Z8546 Personal history of malignant neoplasm of prostate: Secondary | ICD-10-CM | POA: Diagnosis not present

## 2018-03-10 LAB — COMPREHENSIVE METABOLIC PANEL
ALK PHOS: 39 U/L (ref 38–126)
ALT: 30 U/L (ref 0–44)
ANION GAP: 11 (ref 5–15)
AST: 31 U/L (ref 15–41)
Albumin: 4.1 g/dL (ref 3.5–5.0)
BILIRUBIN TOTAL: 0.7 mg/dL (ref 0.3–1.2)
BUN: 23 mg/dL (ref 8–23)
CALCIUM: 9.8 mg/dL (ref 8.9–10.3)
CO2: 24 mmol/L (ref 22–32)
Chloride: 104 mmol/L (ref 98–111)
Creatinine, Ser: 1.58 mg/dL — ABNORMAL HIGH (ref 0.61–1.24)
GFR, EST AFRICAN AMERICAN: 49 mL/min — AB (ref >60)
GFR, EST NON AFRICAN AMERICAN: 42 mL/min — AB (ref >60)
GLUCOSE: 178 mg/dL — AB (ref 70–99)
Potassium: 4.2 mmol/L (ref 3.5–5.1)
Sodium: 139 mmol/L (ref 135–145)
Total Protein: 7.4 g/dL (ref 6.5–8.1)

## 2018-03-10 LAB — CBC WITH DIFFERENTIAL/PLATELET
Abs Immature Granulocytes: 0.06 10*3/uL (ref 0.00–0.07)
BASOS ABS: 0 10*3/uL (ref 0.0–0.1)
Basophils Relative: 1 %
EOS ABS: 0.2 10*3/uL (ref 0.0–0.5)
EOS PCT: 3 %
HEMATOCRIT: 43 % (ref 39.0–52.0)
HEMOGLOBIN: 14 g/dL (ref 13.0–17.0)
Immature Granulocytes: 1 %
LYMPHS PCT: 14 %
Lymphs Abs: 0.9 10*3/uL (ref 0.7–4.0)
MCH: 29.9 pg (ref 26.0–34.0)
MCHC: 32.6 g/dL (ref 30.0–36.0)
MCV: 91.7 fL (ref 80.0–100.0)
MONO ABS: 0.5 10*3/uL (ref 0.1–1.0)
Monocytes Relative: 8 %
NRBC: 0 % (ref 0.0–0.2)
Neutro Abs: 4.7 10*3/uL (ref 1.7–7.7)
Neutrophils Relative %: 73 %
Platelets: 189 10*3/uL (ref 150–400)
RBC: 4.69 MIL/uL (ref 4.22–5.81)
RDW: 14.1 % (ref 11.5–15.5)
WBC: 6.3 10*3/uL (ref 4.0–10.5)

## 2018-03-10 LAB — URINALYSIS, MICROSCOPIC (REFLEX)

## 2018-03-10 LAB — URINALYSIS, ROUTINE W REFLEX MICROSCOPIC
Bilirubin Urine: NEGATIVE
Glucose, UA: NEGATIVE mg/dL
KETONES UR: NEGATIVE mg/dL
Leukocytes, UA: NEGATIVE
NITRITE: NEGATIVE
PH: 5.5 (ref 5.0–8.0)
Protein, ur: 100 mg/dL — AB
Specific Gravity, Urine: >1.03 — ABNORMAL HIGH (ref 1.005–1.030)

## 2018-03-10 MED ORDER — ONDANSETRON 4 MG PO TBDP: 4.0000 mg | ORAL_TABLET | Freq: Four times a day (QID) | ORAL | 0 refills | Status: DC | PRN

## 2018-03-10 MED ORDER — MORPHINE SULFATE (PF) 4 MG/ML IV SOLN
4.0000 mg | Freq: Once | INTRAVENOUS | Status: AC
  Administered 2018-03-10: 4 mg via INTRAVENOUS
  Filled 2018-03-10: qty 1

## 2018-03-10 MED ORDER — HYDROCODONE-ACETAMINOPHEN 5-325 MG PO TABS: 1.0000 | ORAL_TABLET | ORAL | 0 refills | Status: DC | PRN

## 2018-03-10 MED ORDER — ONDANSETRON HCL 4 MG/2ML IJ SOLN
4.0000 mg | Freq: Once | INTRAMUSCULAR | Status: AC
  Administered 2018-03-10: 4 mg via INTRAVENOUS
  Filled 2018-03-10: qty 2

## 2018-03-10 NOTE — ED Provider Notes (Signed)
TIME SEEN: 5:07 AM  CHIEF COMPLAINT: Flank pain  HPI: Patient is a 72 year old male with history of prostate cancer, hypertension, diabetes, hyperlipidemia who presents to the emergency department with right flank pain.  He describes as severe, sharp in nature and radiates from his flank into his right lower abdomen.  States pain woke him from sleep tonight.  He went to bed and was feeling fine.  No injury to his back.  Pain is not worse with palpation or movement.  He cannot seem to get comfortable.  He states he is having a hard time emptying his bladder.  No dysuria or hematuria.  No fever.  Has had nausea but no vomiting.  No chest pain or shortness of breath.  No numbness, tingling or focal weakness.  No previous history of kidney stones.  ROS: See HPI Constitutional: no fever  Eyes: no drainage  ENT: no runny nose   Cardiovascular:  no chest pain  Resp: no SOB  GI: no vomiting GU: no dysuria Integumentary: no rash  Allergy: no hives  Musculoskeletal: no leg swelling  Neurological: no slurred speech ROS otherwise negative  PAST MEDICAL HISTORY/PAST SURGICAL HISTORY:  Past Medical History:  Diagnosis Date  . Cancer [facility])    prostate  . Diabetes mellitus without complication ([place])   . Essential hypertension 03/06/2016  . GERD (gastroesophageal reflux disease)   . Hyperlipidemia   . Sleep apnea    sleeps with BPAP machine    MEDICATIONS:  Prior to Admission medications   Medication Sig Start Date End Date Taking? Authorizing Provider  amLODipine (NORVASC) 10 MG tablet TAKE ONE TABLET BY MOUTH DAILY 02/09/18   Nani Ravens, Crosby Oyster, DO  fenofibrate (TRICOR) 145 MG tablet Take 1 tablet (145 mg total) by mouth daily. 01/20/18   Wendling, Crosby Oyster, DO  glipiZIDE (GLUCOTROL XL) 10 MG 24 hr tablet Take 2 tablets (20 mg total) by mouth daily. 12/31/17   Shelda Pal, DO  glucose blood [facility] VERIO) test strip Use as instructed 03/12/17   Shelda Pal, DO   insulin degludec (TRESIBA FLEXTOUCH) 100 UNIT/ML SOPN FlexTouch Pen INJECT 0.26ML UNDER THE SKIN AT BEDTIME 01/13/18   Wendling, Crosby Oyster, DO  Insulin Pen Needle (BD PEN NEEDLE NANO U/F) 32G X 4 MM MISC Use daily with lantus.  DX E11.9 01/20/18   Shelda Pal, DO  Insulin Pen Needle (NOVOFINE) 32G X 6 MM MISC To use w/ Tyler Aas 07/13/17   Shelda Pal, DO  IRON PO Take 1 tablet by mouth daily.    [provider]  Lancets MISC Use once daily to check blood sugar.  DXE11.9 03/12/17   Shelda Pal, DO  MAGNESIUM PO Take 1 tablet by mouth 2 (two) times daily at 10 AM and 5 PM.    [provider]  metFORMIN (GLUCOPHAGE) 850 MG tablet Take 1 tablet (850 mg total) by mouth 3 (three) times daily with meals. 02/24/18   Shelda Pal, DO  Multiple Vitamins-Minerals (MULTIVITAMIN PO) Take 1 tablet by mouth daily.    [provider]  omeprazole (PRILOSEC) 20 MG capsule Take 1 capsule (20 mg total) by mouth daily. 02/24/18   Shelda Pal, DO  pioglitazone (ACTOS) 30 MG tablet TAKE ONE TABLET BY MOUTH DAILY 02/09/18   Shelda Pal, DO    ALLERGIES:  Allergies  Allergen Reactions  . Statins     Cramping, intolerant of Crestor and Pravastatin  . Sulfa Antibiotics  SOCIAL HISTORY:  Social History   Tobacco Use  . Smoking status: Former Smoker    Last attempt to quit: 06/01/1979    Years since quitting: 38.8  . Smokeless tobacco: Never Used  Substance Use Topics  . Alcohol use: Yes    Alcohol/week: 0.0 standard drinks    FAMILY HISTORY: Family History  Problem Relation Age of Onset  . Hypertension Mother   . Hypertension Father     EXAM: BP (!) 168/90 (BP Location: Right Arm)   Pulse 69   Temp (!) 97.5 F (36.4 C) (Oral)   Resp 18   Ht 6\' 8"  (2.032 m)   Wt 133 kg   SpO2 96%   BMI 32.21 kg/m  CONSTITUTIONAL: Alert and oriented and responds appropriately to questions.  Elderly, appears  uncomfortable but not in distress HEAD: Normocephalic EYES: Conjunctivae clear, pupils appear equal, EOMI ENT: normal nose; moist mucous membranes NECK: Supple, no meningismus, no nuchal rigidity, no LAD  CARD: RRR; S1 and S2 appreciated; no murmurs, no clicks, no rubs, no gallops RESP: Normal chest excursion without splinting or tachypnea; breath sounds clear and equal bilaterally; no wheezes, no rhonchi, no rales, no hypoxia or respiratory distress, speaking full sentences ABD/GI: Normal bowel sounds; non-distended; soft, non-tender, no rebound, no guarding, no peritoneal signs, no hepatosplenomegaly BACK:  The back appears normal and is non-tender to palpation, there is no CVA tenderness, no midline spinal tenderness or step-off or deformity EXT: Normal ROM in all joints; non-tender to palpation; no edema; normal capillary refill; no cyanosis, no calf tenderness or swelling    SKIN: Normal color for age and race; warm; no rash NEURO: Moves all extremities equally, sensation to light touch intact diffusely, no saddle anesthesia PSYCH: The patient's mood and manner are appropriate. Grooming and personal hygiene are appropriate.  MEDICAL DECISION MAKING: Patient here with sudden onset flank pain.  I am concerned for possible kidney stone.  We will also obtain urinalysis to evaluate for possible infectious etiology.  Doubt dissection.  Abdominal exam is benign.  Doubt cholecystitis, appendicitis, pancreatitis, colitis.  Will obtain labs, urine, CT scan.  Will also BladderScan patient.  Will give IV morphine, Zofran for symptomatic relief.  ED PROGRESS: Patient reports feeling much better.  CT scan confirms that he had a 2 mm stone that appears that he has passed into his bladder.  Labs have been unremarkable other than mildly elevated creatinine which is chronic for him.  Urine shows hematuria and proteinuria but no sign of infection.  I feel patient is safe to be discharged.  Will give outpatient  urology follow-up and will discharge with short course of pain and nausea medicine to use as needed.  Patient and wife comfortable with this plan.   At this time, I do not feel there is any life-threatening condition present. I have reviewed and discussed all results (EKG, imaging, lab, urine as appropriate) and exam findings with patient/family. I have reviewed nursing notes and appropriate previous records.  I feel the patient is safe to be discharged home without further emergent workup and can continue workup as an outpatient as needed. Discussed usual and customary return precautions. Patient/family verbalize understanding and are comfortable with this plan.  Outpatient follow-up has been provided if needed. All questions have been answered.      Tavonna Worthington, Delice Bison, DO 03/10/18 [phone]

## 2018-03-10 NOTE — ED Notes (Signed)
Bladder scan 152mls resulted

## 2018-03-10 NOTE — ED Triage Notes (Addendum)
Patient presents with co right lower back pain with nausea; states pain woke him from his sleep; denies hematuria. Ambulatory with steady gait. States took vicoprofen, zofran and xanax pta aroung 0200 am.

## 2018-03-10 NOTE — ED Notes (Signed)
Patient transported to CT 

## 2018-03-17 ENCOUNTER — Ambulatory Visit: Payer: Medicare HMO | Admitting: Podiatry

## 2018-03-17 ENCOUNTER — Encounter: Payer: Self-pay | Admitting: Podiatry

## 2018-03-17 DIAGNOSIS — Q828 Other specified congenital malformations of skin: Secondary | ICD-10-CM | POA: Diagnosis not present

## 2018-03-17 DIAGNOSIS — E1149 Type 2 diabetes mellitus with other diabetic neurological complication: Secondary | ICD-10-CM | POA: Diagnosis not present

## 2018-03-20 NOTE — Progress Notes (Signed)
Subjective: 72 y.o. returns the office today for painful calluses to his feet that he will have trimmed.  Denies any redness or drainage or open sores.  Denies any acute changes since last appointment and no new complaints today. Denies any systemic complaints such as fevers, chills, nausea, vomiting.   PCP: Shelda Pal, DO   Objective: AAO 3, NAD DP/PT pulses palpable, CRT less than 3 seconds Hyperkeratotic lesions present bilateral submetatarsal one on file.  No underlying ulceration, drainage or any signs of infection. No open lesions or pre-ulcerative lesions are identified. No pain with calf compression, swelling, warmth, erythema.  Assessment: Patient presents with symptomatic hyperkeratotic lesions  Plan: -Treatment options including alternatives, risks, complications were discussed -Hyperkeratotic lesion sharply debrided x4 without any complications or bleeding -Discussed daily foot inspection. If there are any changes, to call the office immediately.  -Follow-up in 3 months or sooner if any problems are to arise. In the meantime, encouraged to call the office with any questions, concerns, changes symptoms.  Celesta Gentile, DPM

## 2018-03-23 ENCOUNTER — Other Ambulatory Visit: Payer: Self-pay | Admitting: Family Medicine

## 2018-03-23 DIAGNOSIS — R69 Illness, unspecified: Secondary | ICD-10-CM | POA: Diagnosis not present

## 2018-03-23 MED ORDER — GLUCOSE BLOOD VI STRP: ORAL_STRIP | 12 refills | Status: DC

## 2018-03-23 NOTE — Telephone Encounter (Signed)
Requested Prescriptions  Pending Prescriptions Disp Refills  . glucose blood (ONETOUCH VERIO) test strip 100 each 12    Sig: Use as instructed     Endocrinology: Diabetes - Testing Supplies Passed - 03/23/2018  1:30 PM      Passed - Valid encounter within last 12 months    Recent Outpatient Visits          3 weeks ago OSA treated with [place] at [place], [place], DO   6 months ago Diabetes mellitus type 2 in obese [facility])   Archivist at [employer], [place], [place]   9 months ago Uncontrolled type 2 diabetes mellitus with hyperglycemia [facility])   Archivist at [place], [place], [place]   11 months ago Community acquired pneumonia of right lower lobe of lung [facility])   Archivist at [employer], [place], DO   1 year ago Uncontrolled type 2 diabetes mellitus with hyperglycemia ([place])   Archivist at [employer], [place], [place]      Future Appointments            Tomorrow Nani Ravens, Crosby Oyster, [place] at [employer], [place]

## 2018-03-23 NOTE — Telephone Encounter (Signed)
Copied from [place] [id number]. Topic: Quick Communication - See Telephone Encounter >> Mar 23, 2018  1:25 PM Conception Chancy, NT wrote: CRM for notification. See Telephone encounter for: 03/23/18.  Patient is requesting a refill on glucose blood (ONETOUCH VERIO) test strip.  K[facility] Oppenheim [place], [place] [address] Phone: [phone] Fax: [phone]

## 2018-03-24 ENCOUNTER — Ambulatory Visit: Payer: Medicare HMO | Admitting: Pulmonary Disease

## 2018-03-24 ENCOUNTER — Other Ambulatory Visit: Payer: Self-pay | Admitting: Family Medicine

## 2018-03-24 ENCOUNTER — Encounter: Payer: Self-pay | Admitting: Family Medicine

## 2018-03-24 ENCOUNTER — Encounter: Payer: Self-pay | Admitting: Pulmonary Disease

## 2018-03-24 ENCOUNTER — Ambulatory Visit (INDEPENDENT_AMBULATORY_CARE_PROVIDER_SITE_OTHER): Payer: Medicare HMO | Admitting: Family Medicine

## 2018-03-24 VITALS — BP 130/88 | HR 88 | Ht >= 80 in | Wt 286.0 lb

## 2018-03-24 VITALS — BP 118/84 | HR 73 | Temp 98.0°F | Ht >= 80 in | Wt 286.5 lb

## 2018-03-24 DIAGNOSIS — E669 Obesity, unspecified: Secondary | ICD-10-CM

## 2018-03-24 DIAGNOSIS — I10 Essential (primary) hypertension: Secondary | ICD-10-CM | POA: Diagnosis not present

## 2018-03-24 DIAGNOSIS — E1169 Type 2 diabetes mellitus with other specified complication: Secondary | ICD-10-CM

## 2018-03-24 DIAGNOSIS — G4733 Obstructive sleep apnea (adult) (pediatric): Secondary | ICD-10-CM

## 2018-03-24 LAB — HEMOGLOBIN A1C: HEMOGLOBIN A1C: 8 % — AB (ref 4.6–6.5)

## 2018-03-24 NOTE — Patient Instructions (Signed)
Keep the diet clean and stay active.  Give us 2-3 business days to get the results of your labs back.   Let us know if you need anything.  

## 2018-03-24 NOTE — Progress Notes (Signed)
Ronald Barr    [id number]    07/05/1945  Primary Care Physician:Wendling, Crosby Oyster, DO  Referring Physician: Shelda Pal, [address] [place], [place] [id number]  Chief complaint:   With a chronic history of obstructive sleep apnea Has some difficulty with his machine, feels the machine is not working well, difficulty with inhalation  HPI:  CPAP was diagnosed many years ago Initially had a UPPP Started using a BiPAP Last study was over 10 years ago with his most recent machine about 72 years old Patient does not appear to be working as well as it did before  Usually goes to bed about 10-11, wakes up about 6 AM on most days but sometimes later Wakes up a couple of times to use the bathroom  History of prostate cancer-in the clear present All other medical problems are well controlled at present History of hypertension, diabetes   Never smoker Relevant family history: No significant family history of sleep disordered breathing  Outpatient Encounter Medications as of 03/24/2018  Medication Sig  . amLODipine (NORVASC) 10 MG tablet TAKE ONE TABLET BY MOUTH DAILY  . fenofibrate (TRICOR) 145 MG tablet Take 1 tablet (145 mg total) by mouth daily.  Marland Kitchen glipiZIDE (GLUCOTROL XL) 10 MG 24 hr tablet Take 2 tablets (20 mg total) by mouth daily.  Marland Kitchen glucose blood (ONETOUCH VERIO) test strip Use as instructed  . insulin degludec (TRESIBA FLEXTOUCH) 100 UNIT/ML SOPN FlexTouch Pen INJECT 0.3 ML UNDER THE SKIN AT BEDTIME  . Insulin Pen Needle (BD PEN NEEDLE NANO U/F) 32G X 4 MM MISC Use daily with lantus.  DX E11.9  . Insulin Pen Needle (NOVOFINE) 32G X 6 MM MISC To use w/ Tresiba  . IRON PO Take 1 tablet by mouth daily.  . Lancets MISC Use once daily to check blood sugar.  DXE11.9  . MAGNESIUM PO Take 1 tablet by mouth 2 (two) times daily at 10 AM and 5 PM.  . metFORMIN (GLUCOPHAGE) 850 MG tablet Take 1 tablet (850 mg total) by mouth 3 (three)  times daily with meals.  . Multiple Vitamins-Minerals (MULTIVITAMIN PO) Take 1 tablet by mouth daily.  Marland Kitchen omeprazole (PRILOSEC) 20 MG capsule Take 1 capsule (20 mg total) by mouth daily.  . pioglitazone (ACTOS) 30 MG tablet TAKE ONE TABLET BY MOUTH DAILY  . [DISCONTINUED] HYDROcodone-acetaminophen (NORCO/VICODIN) 5-325 MG tablet Take 1 tablet by mouth every 4 (four) hours as needed.  . [DISCONTINUED] insulin degludec (TRESIBA FLEXTOUCH) 100 UNIT/ML SOPN FlexTouch Pen INJECT 0.26ML UNDER THE SKIN AT BEDTIME   No facility-administered encounter medications on file as of 03/24/2018.     Allergies as of 03/24/2018 - Review Complete 03/24/2018  Allergen Reaction Noted  . Statins  04/17/2016  . Sulfa antibiotics  05/31/2014    Past Medical History:  Diagnosis Date  . Cancer [facility])    prostate  . Diabetes mellitus without complication ([place])   . Essential hypertension 03/06/2016  . GERD (gastroesophageal reflux disease)   . Hyperlipidemia   . Sleep apnea    sleeps with BPAP machine    Past Surgical History:  Procedure Laterality Date  . nose and throat surgery     sleep apnes  . SHOULDER SURGERY    . TONSILLECTOMY      Family History  Problem Relation Age of Onset  . Hypertension Mother   . Hypertension Father     Social History  Socioeconomic History  . Marital status: Married    Spouse name: Not on file  . Number of children: Not on file  . Years of education: Not on file  . Highest education level: Not on file  Occupational History  . Not on file  Social Needs  . Financial resource strain: Not on file  . Food insecurity:    Worry: Not on file    Inability: Not on file  . Transportation needs:    Medical: Not on file    Non-medical: Not on file  Tobacco Use  . Smoking status: Former Smoker    Last attempt to quit: 06/01/1979    Years since quitting: 38.8  . Smokeless tobacco: Never Used  Substance and Sexual Activity  . Alcohol use: Yes    Alcohol/week: 0.0  standard drinks  . Drug use: No  . Sexual activity: Not on file  Lifestyle  . Physical activity:    Days per week: Not on file    Minutes per session: Not on file  . Stress: Not on file  Relationships  . Social connections:    Talks on phone: Not on file    Gets together: Not on file    Attends religious service: Not on file    Active member of club or organization: Not on file    Attends meetings of clubs or organizations: Not on file    Relationship status: Not on file  . Intimate partner violence:    Fear of current or ex partner: Not on file    Emotionally abused: Not on file    Physically abused: Not on file    Forced sexual activity: Not on file  Other Topics Concern  . Not on file  Social History Narrative  . Not on file    Review of Systems  Eyes: Negative.   Respiratory: Positive for apnea.   Cardiovascular: Negative.   Gastrointestinal: Negative.   Musculoskeletal: Negative.   Psychiatric/Behavioral: Positive for sleep disturbance.    Vitals:   03/24/18 1347  BP: 130/88  Pulse: 88  SpO2: 96%     Physical Exam  Constitutional: He appears well-developed and well-nourished.  HENT:  Head: Normocephalic and atraumatic.  Evidence of previous UPPP  Eyes: Conjunctivae are normal. Right eye exhibits no discharge. Left eye exhibits no discharge.  Neck: Normal range of motion. Neck supple. No tracheal deviation present. No thyromegaly present.  Cardiovascular: Normal rate and regular rhythm.  Pulmonary/Chest: Breath sounds normal. No respiratory distress. He has no wheezes. He has no rales.  Abdominal: Soft. Bowel sounds are normal. He exhibits no distension. There is no tenderness.   Data Reviewed: Did bring [place] a copy of his previous sleep study-reviewed He was set up on a BiPAP of 11/8  Assessment:   Obstructive sleep apnea  Very compliant with treatment  Dysfunctional machine  Plan/Recommendations:  We will order home sleep  study  Pathophysiology of sleep disordered breathing discussed with the patient  Treatment options discussed with the patient  Use of an auto titrating BiPAP versus a fixed BiPAP was discussed  I will see him back in the office in about 3 months Encouraged to call with any significant concerns  Sherrilyn Rist MD [facility] 03/24/2018, 2:11 PM  CC: Shelda Pal*

## 2018-03-24 NOTE — Progress Notes (Signed)
Subjective:   Chief Complaint  Patient presents with  . Follow-up    6 month    Ronald Barr is a 72 y.o. male here for follow-up of diabetes.   Ronald Barr's self monitored glucose range is mid-100's Patient denies hypoglycemic reactions. He checks his glucose levels 1 times per day. Patient does not require insulin.   Medications include: Glucotrol 20 mg/d, Metformin 850 mg TID, Actos Exercise: chronic hip pain  Hypertension Patient presents for hypertension follow up. He does not monitor home blood pressures. He is compliant with medications. Patient has these side effects of medication: none He is adhering to a healthy diet overall. Exercise: none   Past Medical History:  Diagnosis Date  . Cancer [facility])    prostate  . Diabetes mellitus without complication ([place])   . Essential hypertension 03/06/2016  . GERD (gastroesophageal reflux disease)   . Hyperlipidemia   . Sleep apnea    sleeps with BPAP machine     Related testing: Date of retinal exam: Scheduled next week Pneumovax: done Flu Shot: done  Review of Systems: Pulmonary:  No SOB Cardiovascular:  No chest pain  Objective:  BP 118/84 (BP Location: Left Arm, Patient Position: Sitting, Cuff Size: Large)   Pulse 73   Temp 98 F (36.7 C) (Oral)   Ht 6\' 8"  (2.032 m)   Wt 286 lb 8 oz (130 kg)   SpO2 96%   BMI 31.47 kg/m  General:  Well developed, well nourished, in no apparent distress Head:  Normocephalic, atraumatic Eyes:  Pupils equal and round, sclera anicteric without injection  Lungs:  CTAB, no access msc use Cardio:  RRR, no bruits, no LE edema Psych: Age appropriate judgment and insight  Assessment:   Diabetes mellitus type 2 in obese (HCC) - Plan: Hemoglobin A1c  Essential hypertension   Plan:   Orders as above. Counseled on diet and exercise. Doing well. Might be able to cut back on checking sugars. F/u in 6 mo. The patient voiced understanding and agreement to the plan.  Decker, DO 03/24/18 9:26 AM

## 2018-03-24 NOTE — Progress Notes (Signed)
Pre visit review using our clinic review tool, if applicable. No additional management support is needed unless otherwise documented below in the visit note. 

## 2018-03-24 NOTE — Patient Instructions (Signed)
Long-standing history of obstructive sleep apnea, compliant with BiPAP use Machine is dysfunctional  We will set you up with a home sleep study The plan will be an auto titrating BiPAP  I will see you back in the office in about 3 months Call with any significant concerns

## 2018-03-28 ENCOUNTER — Other Ambulatory Visit: Payer: Self-pay | Admitting: Family Medicine

## 2018-03-28 DIAGNOSIS — IMO0001 Reserved for inherently not codable concepts without codable children: Secondary | ICD-10-CM

## 2018-03-28 DIAGNOSIS — E1165 Type 2 diabetes mellitus with hyperglycemia: Principal | ICD-10-CM

## 2018-03-29 MED ORDER — GLIPIZIDE ER 10 MG PO TB24: 20.0000 mg | ORAL_TABLET | Freq: Every day | ORAL | 1 refills | Status: DC

## 2018-04-01 ENCOUNTER — Encounter: Payer: Self-pay | Admitting: Family Medicine

## 2018-04-01 DIAGNOSIS — H524 Presbyopia: Secondary | ICD-10-CM | POA: Diagnosis not present

## 2018-04-01 LAB — HM DIABETES EYE EXAM

## 2018-04-04 ENCOUNTER — Encounter: Payer: Self-pay | Admitting: Family Medicine

## 2018-04-05 ENCOUNTER — Telehealth: Payer: Self-pay | Admitting: *Deleted

## 2018-04-05 MED ORDER — LANCETS MISC: 11 refills | Status: DC

## 2018-04-05 NOTE — Telephone Encounter (Signed)
Received Diabetic Eye Exam Report from Dr. Tonita Cong, OD; forwarded to provider/SLS 11/26

## 2018-04-06 ENCOUNTER — Other Ambulatory Visit: Payer: Self-pay | Admitting: Family Medicine

## 2018-04-06 DIAGNOSIS — R69 Illness, unspecified: Secondary | ICD-10-CM | POA: Diagnosis not present

## 2018-04-06 MED ORDER — ONETOUCH DELICA LANCETS 33G MISC: 6 refills | Status: DC

## 2018-04-15 ENCOUNTER — Telehealth: Payer: Self-pay | Admitting: Pulmonary Disease

## 2018-04-15 DIAGNOSIS — G4733 Obstructive sleep apnea (adult) (pediatric): Secondary | ICD-10-CM

## 2018-04-15 NOTE — Telephone Encounter (Signed)
Called and spoke with patient. I let him know we were going to put in the order for the HST and someone would get a hold of him next week.   Patient had appointment 11/14 and order was missed.   Will route to [facility] to keep eye out

## 2018-04-15 NOTE — Telephone Encounter (Signed)
Order printed by Earnest Bailey and given to Libby/cb

## 2018-04-16 ENCOUNTER — Other Ambulatory Visit: Payer: Self-pay | Admitting: Family Medicine

## 2018-04-16 DIAGNOSIS — E119 Type 2 diabetes mellitus without complications: Secondary | ICD-10-CM

## 2018-04-26 ENCOUNTER — Telehealth: Payer: Self-pay | Admitting: Family Medicine

## 2018-04-26 ENCOUNTER — Emergency Department (HOSPITAL_BASED_OUTPATIENT_CLINIC_OR_DEPARTMENT_OTHER)
Admission: EM | Admit: 2018-04-26 | Discharge: 2018-04-26 | Disposition: A | Discharge status: Home / Self Care | Payer: Medicare HMO | Attending: Emergency Medicine | Admitting: Emergency Medicine

## 2018-04-26 ENCOUNTER — Telehealth: Payer: Self-pay | Admitting: Pulmonary Disease

## 2018-04-26 ENCOUNTER — Encounter: Payer: Self-pay | Admitting: Family Medicine

## 2018-04-26 ENCOUNTER — Emergency Department (HOSPITAL_BASED_OUTPATIENT_CLINIC_OR_DEPARTMENT_OTHER): Payer: Medicare HMO

## 2018-04-26 ENCOUNTER — Encounter (HOSPITAL_BASED_OUTPATIENT_CLINIC_OR_DEPARTMENT_OTHER): Payer: Self-pay | Admitting: Emergency Medicine

## 2018-04-26 ENCOUNTER — Other Ambulatory Visit: Payer: Self-pay

## 2018-04-26 DIAGNOSIS — J40 Bronchitis, not specified as acute or chronic: Secondary | ICD-10-CM | POA: Diagnosis not present

## 2018-04-26 DIAGNOSIS — I10 Essential (primary) hypertension: Secondary | ICD-10-CM | POA: Insufficient documentation

## 2018-04-26 DIAGNOSIS — R0981 Nasal congestion: Secondary | ICD-10-CM | POA: Insufficient documentation

## 2018-04-26 DIAGNOSIS — R0989 Other specified symptoms and signs involving the circulatory and respiratory systems: Secondary | ICD-10-CM | POA: Diagnosis not present

## 2018-04-26 DIAGNOSIS — R05 Cough: Secondary | ICD-10-CM | POA: Diagnosis present

## 2018-04-26 DIAGNOSIS — E119 Type 2 diabetes mellitus without complications: Secondary | ICD-10-CM | POA: Diagnosis not present

## 2018-04-26 DIAGNOSIS — Z87891 Personal history of nicotine dependence: Secondary | ICD-10-CM | POA: Insufficient documentation

## 2018-04-26 DIAGNOSIS — Z8546 Personal history of malignant neoplasm of prostate: Secondary | ICD-10-CM | POA: Diagnosis not present

## 2018-04-26 MED ORDER — FLUTICASONE PROPIONATE 50 MCG/ACT NA SUSP: 2.0000 | Freq: Every day | NASAL | 0 refills | Status: DC

## 2018-04-26 MED ORDER — AZITHROMYCIN 250 MG PO TABS: 250.0000 mg | ORAL_TABLET | Freq: Every day | ORAL | 0 refills | Status: DC

## 2018-04-26 MED ORDER — BENZONATATE 100 MG PO CAPS: 100.0000 mg | ORAL_CAPSULE | Freq: Three times a day (TID) | ORAL | 0 refills | Status: DC | PRN

## 2018-04-26 MED ORDER — IPRATROPIUM-ALBUTEROL 0.5-2.5 (3) MG/3ML IN SOLN
3.0000 mL | Freq: Four times a day (QID) | RESPIRATORY_TRACT | Status: DC
  Administered 2018-04-26: 3 mL via RESPIRATORY_TRACT
  Filled 2018-04-26: qty 3

## 2018-04-26 MED ORDER — ALBUTEROL SULFATE HFA 108 (90 BASE) MCG/ACT IN AERS
2.0000 | INHALATION_SPRAY | Freq: Once | RESPIRATORY_TRACT | Status: DC
  Filled 2018-04-26: qty 6.7

## 2018-04-26 NOTE — Discharge Instructions (Signed)

## 2018-04-26 NOTE — Telephone Encounter (Signed)
Copied from [place] [id number]. Topic: General - Other >> Apr 26, 2018  3:54 PM Carolyn Stare wrote:  Judeen Hammans with pulmonary wanted to let Dr Nani Ravens know that the pt would be contacted in the next few days to be scheduled for the sleep study. She said the appt with pulmonary take 3 weeks or so to schedule

## 2018-04-26 NOTE — ED Triage Notes (Signed)
Head cold for over a week and cough started 4 days ago.

## 2018-04-26 NOTE — Telephone Encounter (Signed)
Order should have been placed on 11/14 & was missed.  I checked Libby's folder and it has been precerted.  I placed the order on top & called Dr Irene Limbo office & made them aware pt would be called in next few days to be scheduled.  Nothing further needed.

## 2018-04-26 NOTE — ED Provider Notes (Signed)
Emergency Department Provider Note   I have reviewed the triage vital signs and the nursing notes.   HISTORY  Chief Complaint URI   HPI Ronald Barr is a 72 y.o. male with PMH of DM, HTN, HLD, GERD, and OSA presents to the emergency department with congestion, cough, and intermittent headache.  Symptoms been ongoing for the past 6 days.  He denies any fevers or body aches.  No sick contacts.  Patient states initially his symptoms developed as mostly face and nasal congestion.  Has had some intermittent pain in the right ear and pain to touch over the right scalp.  Over the past 4 days he is developed worsening, nonproductive cough with some shortness of breath and generalized fatigue.  No shaking chills.  No vomiting or diarrhea.  Patient does not smoke cigarettes.  No history of COPD or asthma.   Past Medical History:  Diagnosis Date  . Cancer [facility])    prostate  . Diabetes mellitus without complication ([place])   . Essential hypertension 03/06/2016  . GERD (gastroesophageal reflux disease)   . Hyperlipidemia   . Sleep apnea    sleeps with BPAP machine    Patient Active Problem List   Diagnosis Date Noted  . OSA treated with BiPAP 02/24/2018  . Medial epicondylitis of elbow, left 09/20/2017  . Diabetes mellitus type 2 in obese ([place]) 09/16/2017  . Hyperlipidemia 03/11/2017  . Dupuytrens contracture 01/07/2017  . Trigger thumb, right thumb 12/17/2016  . Essential hypertension 03/06/2016  . Primary osteoarthritis of left knee 07/10/2014  . Trochanteric bursitis of left hip 07/10/2014  . Chronic venous insufficiency 05/31/2014    Past Surgical History:  Procedure Laterality Date  . nose and throat surgery     sleep apnes  . SHOULDER SURGERY    . TONSILLECTOMY     Allergies Statins and Sulfa antibiotics  Family History  Problem Relation Age of Onset  . Hypertension Mother   . Hypertension Father     Social History Social History   Tobacco Use  . Smoking  status: Former Smoker    Last attempt to quit: 06/01/1979    Years since quitting: 38.9  . Smokeless tobacco: Never Used  Substance Use Topics  . Alcohol use: Yes    Alcohol/week: 0.0 standard drinks  . Drug use: No    Review of Systems  Constitutional: No fever/chills. Positive fatigue.  Eyes: No visual changes. ENT: No sore throat. Mild right ear pain.  Cardiovascular: Denies chest pain. Respiratory: Positive shortness of breath and cough.  Gastrointestinal: No abdominal pain.  No nausea, no vomiting.  No diarrhea.  No constipation. Genitourinary: Negative for dysuria. Musculoskeletal: Negative for back pain. Skin: Negative for rash. Neurological: Negative for focal weakness or numbness. Positive intermittent HA.  10-point ROS otherwise negative.  ____________________________________________   PHYSICAL EXAM:  VITAL SIGNS: ED Triage Vitals  Enc Vitals Group     BP 04/26/18 1649 (!) 151/92     Pulse Rate 04/26/18 1649 93     Resp 04/26/18 1649 18     Temp 04/26/18 1649 98.3 F (36.8 C)     Temp Source 04/26/18 1649 Oral     SpO2 04/26/18 1649 96 %     Weight 04/26/18 1650 282 lb (127.9 kg)     Height 04/26/18 1650 6\' 8"  (2.032 m)     Pain Score 04/26/18 1651 0   Constitutional: Alert and oriented. Well appearing and in no acute distress. Eyes: Conjunctivae are normal.  Head: Atraumatic. Ears:  Healthy appearing ear canals and TMs bilaterally Nose: Positive congestion/rhinnorhea. Mouth/Throat: Mucous membranes are moist.  Oropharynx with mild erythema. No PTA.  Neck: No stridor.   Cardiovascular: Normal rate, regular rhythm. Good peripheral circulation. Grossly normal heart sounds.   Respiratory: Normal respiratory effort.  No retractions. Lungs CTAB. Gastrointestinal: Soft and nontender. No distention.  Musculoskeletal: No lower extremity tenderness nor edema. No gross deformities of extremities. Neurologic:  Normal speech and language. No gross focal neurologic  deficits are appreciated.  Skin:  Skin is warm, dry and intact. No rash noted.  ____________________________________________  RADIOLOGY  Dg Chest 2 View  Result Date: 04/26/2018 CLINICAL DATA:  Chest congestion for several days EXAM: CHEST - 2 VIEW COMPARISON:  None. FINDINGS: Cardiac shadow is within normal limits. Interstitial changes are noted bilaterally likely of a chronic nature similar to that seen on recent CT of the abdomen and pelvis. No focal confluent infiltrate or sizable effusion is seen. No acute bony abnormality is noted. IMPRESSION: Chronic interstitial changes stable from a prior CT examination. Electronically Signed   By: Inez Catalina M.D.   On: 04/26/2018 17:44    ____________________________________________   PROCEDURES  Procedure(s) performed:   Procedures  None ____________________________________________   INITIAL IMPRESSION / ASSESSMENT AND PLAN / ED COURSE  Pertinent labs & imaging results that were available during my care of the patient were reviewed by me and considered in my medical decision making (see chart for details).  Patient presents to the emergency department with upper respiratory tract infection symptoms including nasal congestion, intermittent earache/headache, along with development of cough over the past 4 days.  Total days of sickness in the 6 to 7-day range.  Patient outside of the window to benefit from Tamiflu.  Patient with no hypoxemia or respiratory distress.  He is not experiencing chest pain.  Atypical ACS is extremely unlikely with most of his symptoms centering around nasal congestion.  Plan for chest x-ray to rule out pneumonia and will give an inhaler here for symptom management.   CXR reviewed. No acute findings. Patient more comfortable after neb. Plan for supportive care at home. Patient following with pulmonary med for severe OSA. Discussed ED return precautions and PCP follow up plan.    ____________________________________________  FINAL CLINICAL IMPRESSION(S) / ED DIAGNOSES  Final diagnoses:  Bronchitis  Nasal congestion    NEW OUTPATIENT MEDICATIONS STARTED DURING THIS VISIT:  Discharge Medication List as of 04/26/2018  6:02 PM    START taking these medications   Details  azithromycin (ZITHROMAX) 250 MG tablet Take 1 tablet (250 mg total) by mouth daily. Take first 2 tablets together, then 1 every day until finished., Starting Tue 04/26/2018, Normal    benzonatate (TESSALON) 100 MG capsule Take 1 capsule (100 mg total) by mouth 3 (three) times daily as needed for cough., Starting Tue 04/26/2018, Normal    fluticasone (FLONASE) 50 MCG/ACT nasal spray Place 2 sprays into both nostrils daily for 7 days., Starting Tue 04/26/2018, Until Tue 05/03/2018, Normal        Note:  This document was prepared using Dragon voice recognition software and may include unintentional dictation errors.  Nanda Quinton, MD Emergency Medicine    Aaryan Essman, [facility], MD 04/27/18 [phone]

## 2018-04-26 NOTE — ED Notes (Signed)
Pt states he has chest and nasal congestion x 4 days. States that he is having green nasal drainage. He states it feels like he has congestion in his chest but is unable to cough up anything. Pt denies fever, vomiting or diarrhea.

## 2018-05-05 ENCOUNTER — Telehealth: Payer: Self-pay

## 2018-05-05 NOTE — Telephone Encounter (Signed)
Received fax from [facility] recommending statin therapy for pt d/t diabetes hx.; Routed to Dr. Nani Ravens to review.

## 2018-05-05 NOTE — Telephone Encounter (Signed)
Quita Skye and I would love if he were able to tolerate them. He gets myalgias, he's tried 5 different ones, all with same effect. TY.

## 2018-05-11 ENCOUNTER — Other Ambulatory Visit: Payer: Self-pay | Admitting: Family Medicine

## 2018-05-12 ENCOUNTER — Other Ambulatory Visit: Payer: Self-pay | Admitting: Family Medicine

## 2018-05-12 DIAGNOSIS — L570 Actinic keratosis: Secondary | ICD-10-CM | POA: Diagnosis not present

## 2018-05-12 DIAGNOSIS — Z85828 Personal history of other malignant neoplasm of skin: Secondary | ICD-10-CM | POA: Diagnosis not present

## 2018-05-12 DIAGNOSIS — L700 Acne vulgaris: Secondary | ICD-10-CM | POA: Diagnosis not present

## 2018-05-12 DIAGNOSIS — D485 Neoplasm of uncertain behavior of skin: Secondary | ICD-10-CM | POA: Diagnosis not present

## 2018-05-12 DIAGNOSIS — C44319 Basal cell carcinoma of skin of other parts of face: Secondary | ICD-10-CM | POA: Diagnosis not present

## 2018-05-12 DIAGNOSIS — L820 Inflamed seborrheic keratosis: Secondary | ICD-10-CM | POA: Diagnosis not present

## 2018-05-12 DIAGNOSIS — L821 Other seborrheic keratosis: Secondary | ICD-10-CM | POA: Diagnosis not present

## 2018-05-12 MED ORDER — AMLODIPINE BESYLATE 10 MG PO TABS: 10.0000 mg | ORAL_TABLET | Freq: Every day | ORAL | 0 refills | Status: DC

## 2018-05-12 MED ORDER — PIOGLITAZONE HCL 30 MG PO TABS: 30.0000 mg | ORAL_TABLET | Freq: Every day | ORAL | 0 refills | Status: DC

## 2018-05-13 DIAGNOSIS — G4733 Obstructive sleep apnea (adult) (pediatric): Secondary | ICD-10-CM | POA: Diagnosis not present

## 2018-05-16 ENCOUNTER — Other Ambulatory Visit: Payer: Self-pay | Admitting: *Deleted

## 2018-05-16 DIAGNOSIS — G4733 Obstructive sleep apnea (adult) (pediatric): Secondary | ICD-10-CM | POA: Diagnosis not present

## 2018-05-19 ENCOUNTER — Telehealth: Payer: Self-pay | Admitting: Pulmonary Disease

## 2018-05-19 DIAGNOSIS — G4733 Obstructive sleep apnea (adult) (pediatric): Secondary | ICD-10-CM

## 2018-05-19 NOTE — Telephone Encounter (Signed)
Per AO, HST showed severe sleep apnea and moderately severe oxygen desaturation. He recommends bipap therapy. Recommends an auto bipap with low pressure of 5, high pressure of 20 with pressure support of 4.   Will order once patient is aware and willing to proceed with order.

## 2018-05-19 NOTE — Telephone Encounter (Signed)
Left message for patient to call back for results. Will route to [facility] for follow up.

## 2018-05-19 NOTE — Telephone Encounter (Signed)
This MyChart encounter was inadvertently sent to the incorrect pool and therefor was not able to be seen by Pulmonary. Telephone encounter created on 12.17.19 answered pt's concerns/questions. Will sign off.

## 2018-05-23 NOTE — Telephone Encounter (Signed)
Patient returning phone call.  Phone number is [phone].

## 2018-05-23 NOTE — Telephone Encounter (Signed)
Spoke with patient. He is aware of results. He stated that he has been on a bipap machine for over 20 years and needs an order for a new machine. He is currently not established with any DME. Advised patient of the process, he verbalized understanding. Order has been placed. Nothing further needed at time of call.

## 2018-05-25 ENCOUNTER — Telehealth: Payer: Self-pay | Admitting: Pulmonary Disease

## 2018-05-25 DIAGNOSIS — G4733 Obstructive sleep apnea (adult) (pediatric): Secondary | ICD-10-CM

## 2018-05-25 NOTE — Telephone Encounter (Signed)
Called and left message for Barbaraann Rondo [facility] to call back.

## 2018-05-26 DIAGNOSIS — Z85828 Personal history of other malignant neoplasm of skin: Secondary | ICD-10-CM | POA: Diagnosis not present

## 2018-05-26 DIAGNOSIS — C44319 Basal cell carcinoma of skin of other parts of face: Secondary | ICD-10-CM | POA: Diagnosis not present

## 2018-05-26 NOTE — Telephone Encounter (Signed)
Called and spoke with rodney, [facility].  [facility] can not order a bipap without a CPAP titration.  Patient had HST on 05/16/2018.   Dr. Ander Slade, please advise

## 2018-06-01 NOTE — Telephone Encounter (Signed)
Dr. Olalere - please advise. Thanks. 

## 2018-06-01 NOTE — Telephone Encounter (Signed)
Patient is already established on a BiPAP and compliant  Machine is old and becoming dysfunctional  Study was done for confirmation of his diagnosis of sleep apnea

## 2018-06-01 NOTE — Telephone Encounter (Signed)
Called and left message with Barbaraann Rondo [facility], to call back.

## 2018-06-02 NOTE — Telephone Encounter (Signed)
Attempted to contact [place] with [facility]. When entering his extension, I am prompted to enter a mailbox number. Will try back later.

## 2018-06-06 NOTE — Telephone Encounter (Signed)
Attempted to contact our new liaisons at [facility]. I could not get the call to go through. Will route message to triage so that we can follow up on this.

## 2018-06-06 NOTE — Telephone Encounter (Signed)
[place] with [facility] of AO's authorization of the CPAP titration study. Called spoke with patient, he is aware of CPAP titration order. Order placed.  Will keep phone note open in case Barbaraann Rondo calls back tomorrow.

## 2018-06-06 NOTE — Telephone Encounter (Signed)
We can schedule him for a titration study  thanks

## 2018-06-06 NOTE — Telephone Encounter (Signed)
Spoke with Sonia Baller with [facility]. She states that the pt will have to have a CPAP titration study done. A HST will not be enough for insurance purposes to cover a new BiPAP machine, even though he is currently using a BiPAP.  Spoke with pt. He is aware of this information.  Dr. Ander Slade - please advise. Thanks.

## 2018-06-06 NOTE — Telephone Encounter (Signed)
Patient states has not heard from [place] about Bipap machine.  Patient's phone number is [phone].

## 2018-06-08 NOTE — Telephone Encounter (Signed)
Spoke with Ronald Barr at [facility]. He is aware that this sleep study has been ordered. Nothing further was needed.

## 2018-06-09 ENCOUNTER — Ambulatory Visit: Payer: Medicare HMO | Admitting: Podiatry

## 2018-06-16 DIAGNOSIS — R69 Illness, unspecified: Secondary | ICD-10-CM | POA: Diagnosis not present

## 2018-06-23 ENCOUNTER — Ambulatory Visit: Payer: Medicare HMO | Admitting: Podiatry

## 2018-06-23 DIAGNOSIS — R69 Illness, unspecified: Secondary | ICD-10-CM | POA: Diagnosis not present

## 2018-06-26 ENCOUNTER — Encounter: Payer: Self-pay | Admitting: Family Medicine

## 2018-06-27 MED ORDER — INSULIN DEGLUDEC 100 UNIT/ML ~~LOC~~ SOPN: PEN_INJECTOR | SUBCUTANEOUS | 6 refills | Status: DC

## 2018-06-30 ENCOUNTER — Ambulatory Visit: Payer: Medicare HMO | Admitting: Podiatry

## 2018-07-01 DIAGNOSIS — R69 Illness, unspecified: Secondary | ICD-10-CM | POA: Diagnosis not present

## 2018-07-07 ENCOUNTER — Ambulatory Visit: Payer: Medicare HMO | Admitting: Podiatry

## 2018-07-07 ENCOUNTER — Encounter: Payer: Self-pay | Admitting: Podiatry

## 2018-07-07 DIAGNOSIS — Q828 Other specified congenital malformations of skin: Secondary | ICD-10-CM | POA: Diagnosis not present

## 2018-07-07 DIAGNOSIS — E1149 Type 2 diabetes mellitus with other diabetic neurological complication: Secondary | ICD-10-CM | POA: Diagnosis not present

## 2018-07-07 DIAGNOSIS — M79675 Pain in left toe(s): Secondary | ICD-10-CM

## 2018-07-07 DIAGNOSIS — B351 Tinea unguium: Secondary | ICD-10-CM

## 2018-07-07 DIAGNOSIS — M79674 Pain in right toe(s): Secondary | ICD-10-CM | POA: Diagnosis not present

## 2018-07-08 NOTE — Progress Notes (Signed)
Subjective: 73 y.o. returns the office today for painful calluses to his feet that he will have trimmed as well as for elongated toenails are causing discomfort mostly with shoes.  Denies any redness or drainage of the toenail or callus sites.  No open sores.  She feels the numbness may be getting somewhat worse to his feet is not causing significant problems.  Denies any systemic complaints such as fevers, chills, nausea, vomiting.   PCP: Shelda Pal, DO (last seen 03/24/2018) A1c: 8 (03/24/2018)  Objective: AAO 3, NAD DP/PT pulses palpable, CRT less than 3 seconds Hyperkeratotic lesions present bilateral submetatarsal one on file.  No underlying ulceration, drainage or any signs of infection. Nails are hypertrophic, dystrophic, brittle, discolored, elongated 10. No surrounding redness or drainage. Tenderness nails 1-5 bilaterally.  No open lesions or pre-ulcerative lesions are identified. No pain with calf compression, swelling, warmth, erythema.  Assessment: Patient presents with symptomatic hyperkeratotic lesions; symptomatic onychomycosis; 2 uncontrolled type diabetes with neuropathy  Plan: -Treatment options including alternatives, risks, complications were discussed -Hyperkeratotic lesion sharply debrided x4 without any complications or bleeding -Discussed daily foot inspection. If there are any changes, to call the office immediately.  -Follow-up in 3 months or sooner if any problems are to arise. In the meantime, encouraged to call the office with any questions, concerns, changes symptoms.  Celesta Gentile, DPM

## 2018-07-17 ENCOUNTER — Encounter (HOSPITAL_BASED_OUTPATIENT_CLINIC_OR_DEPARTMENT_OTHER): Payer: Self-pay

## 2018-07-20 ENCOUNTER — Ambulatory Visit (HOSPITAL_BASED_OUTPATIENT_CLINIC_OR_DEPARTMENT_OTHER): Payer: Medicare HMO | Attending: Pulmonary Disease | Admitting: Pulmonary Disease

## 2018-07-20 VITALS — Ht >= 80 in | Wt 283.0 lb

## 2018-07-20 DIAGNOSIS — G4733 Obstructive sleep apnea (adult) (pediatric): Secondary | ICD-10-CM | POA: Diagnosis not present

## 2018-07-24 ENCOUNTER — Telehealth: Payer: Self-pay | Admitting: Pulmonary Disease

## 2018-07-24 DIAGNOSIS — G4733 Obstructive sleep apnea (adult) (pediatric): Secondary | ICD-10-CM

## 2018-07-24 NOTE — Procedures (Signed)
POLYSOMNOGRAPHY  Last, First: Ronald, Barr MRN: [id number] Gender: Male Age (years): 65 Weight (lbs): 283 DOB: 1946-03-21 BMI: 31 Primary Care: Riki Sheer DO Epworth Score: 5 Referring: Laurin Coder MD Technician: Gwenyth Allegra Interpreting: Laurin Coder MD Study Type: CPAP Ordered Study Type: CPAP Study date: 07/20/2018 Location: [facility] CLINICAL INFORMATION Ronald Barr is a 73 year old Male and was referred to the sleep center for evaluation of Obstructive Sleep Apnea, previously established OSA, prior diagnostic polysomnogram is not available.. Indications include Diabetes, Hypertension, OSA.  MEDICATIONS Patient self administered medications include: N/A. Medications administered during study include No sleep medicine administered.  SLEEP STUDY TECHNIQUE The patient underwent an attended overnight polysomnography titration to assess the effects of CPAP therapy. The following variables were monitored: EEG(C4-A1, C3-A2, O1-A2, O2-A1), EOG, submental and leg EMG, ECG, oxyhemoglobin saturation by pulse oximetry, thoracic and abdominal respiratory effort belts, nasal/oral airflow by pressure sensor, body position sensor and snoring sensor. CPAP pressure was titrated to eliminate apneas, hypopneas and oxygen desaturation. Hypopneas were scored per AASM definition IB (4% desaturation)  TECHNICIAN COMMENTS Comments added by Technician: Patient had more than two awakenings to use the bathroom Comments added by Scorer: N/A SLEEP ARCHITECTURE The study was initiated at 10:07:58 PM and terminated at 5:03:08 AM. Total recorded time was 415.2 minutes. EEG confirmed total sleep time was 323 minutes yielding a sleep efficiency of 77.8%%. Sleep onset after lights out was 5.8 minutes with a REM latency of 128.5 minutes. The patient spent 9.9%% of the night in stage N1 sleep, 70.9%% in stage N2 sleep, 0.0%% in stage N3 and 19.2% in REM. The Arousal Index was  11.5/hour. RESPIRATORY PARAMETERS The overall AHI was 7.1 per hour, and the RDI was 9.3 events/hour with a central apnea index of 0.0per hour. The most appropriate setting of CPAP was 17 cm H2O. At this setting, the sleep efficiency was 42% and the patient was supine for 0%. The AHI was 0.0 events per hour, and the RDI was 0.0 events/hour (with 0.0 central events) and the arousal index was 12.1 per hour.The oxygen nadir was 92.0% during sleep.    The cumulative time under 88% oxygen saturation was 5.5 minutes  LEG MOVEMENT DATA The total leg movements were 236 with a resulting leg movement index of 43.8/hr. Associated arousal with leg movement index was 0.4/hr. CARDIAC DATA The underlying cardiac rhythm was most consistent with sinus rhythm. Mean heart rate during sleep was 70.8 bpm. Additional rhythm abnormalities include None. IMPRESSIONS - EKG showed no cardiac abnormalities. - The patient snored with soft snoring volume. - Moderate Oxygen Desaturation - Mild Obstructive Sleep apnea(OSA) Optimal pressure attained. - No Significant Central Sleep Apnea (CSA) - No significant periodic leg movements(PLMs) during sleep. No significant associated arousals. - Reduced sleep efficiency, normal primary sleep latency, long REM sleep latency and no slow wave latency.   DIAGNOSIS - Obstructive Sleep Apnea (327.23 [G47.33 ICD-10])   RECOMMENDATIONS - Trial of CPAP therapy on 15 cm H2O with a Large size Fisher&Paykel Full Face Mask Simplus mask and heated humidification. - Avoid alcohol, sedatives and other CNS depressants that may worsen sleep apnea and disrupt normal sleep architecture. - Sleep hygiene should be reviewed to assess factors that may improve sleep quality. - Weight management and regular exercise should be initiated or continued. - Return to Sleep Center for re-evaluation after 4 weeks of therapy  [Electronically signed] 07/24/2018 10:49 AM  Sherrilyn Rist MD NPI:  [id number]

## 2018-07-24 NOTE — Telephone Encounter (Signed)
Sleep study result  Date of study: 07/20/2018  Impression Obstructive sleep apnea adequately treated with CPAP therapy  Recommendation: CPAP therapy on 15 cm H2O with a Large size Fisher&Paykel Full Face Mask Simplus mask and heated humidification.  Follow-up in the office as scheduled

## 2018-07-26 ENCOUNTER — Encounter: Payer: Self-pay | Admitting: Family Medicine

## 2018-07-26 ENCOUNTER — Other Ambulatory Visit: Payer: Self-pay | Admitting: Family Medicine

## 2018-07-26 DIAGNOSIS — E119 Type 2 diabetes mellitus without complications: Secondary | ICD-10-CM

## 2018-07-27 MED ORDER — FENOFIBRATE 145 MG PO TABS: 145.0000 mg | ORAL_TABLET | Freq: Every day | ORAL | 0 refills | Status: DC

## 2018-07-27 NOTE — Telephone Encounter (Signed)
lmom 

## 2018-07-28 DIAGNOSIS — R69 Illness, unspecified: Secondary | ICD-10-CM | POA: Diagnosis not present

## 2018-07-28 NOTE — Telephone Encounter (Signed)
SWP & I went over issues with when we received the first electronic refill request from [facility] for the Tresiba.  Pt stated he has had issues in the past getting this refill and I explained to him also the best way to message Dr. Nani Ravens via [place] as an urgent msg if he gets critically low d/t issues btwn his pharmacy and our office receiving the refill request.  Pt acknowledged understanding as I stated we are trying to provide the best care possible to him and would not want him to be without his medication.  I also informed the pt some medications may not be able to be requested via MyChart if they require timely visits with the provider for refills.  Pt stated he always keeps his appts and stays on top of his visits.  Pt was appreciative of the f/u phone call and thanked me for helping him.

## 2018-08-04 NOTE — Telephone Encounter (Signed)
Called and spoke with pt letting him know the results of the titration study and stated to pt the recommendation per AO based on the study.  When I stated to pt that the recommendation was cpap therapy, pt immediately stated to me that he has been on bipap for years and wanted to make sure that it was correct in regards to the cpap therapy.  Also, pt does not have a follow up appt currently scheduled. Dr. Ander Slade, please advise if it is indeed cpap therapy or if it is bipap therapy that pt is supposed to be doing and also please advise when you are wanting pt to follow up at office? Thanks!

## 2018-08-04 NOTE — Telephone Encounter (Signed)
Patient was only titrated to CPAP while he was in the lab, is seem to have tolerated it well, his amount of sleep was not optimal.  During the sleep study, most will usually start almost everybody on CPAP and then transition to BiPAP, if the CPAP pressures become intolerable or significantly high  CPAP of 15 did control his sleep disordered breathing  Some people do not tolerate CPAP as well and if he is used to a BiPAP-he may have that difficulty  I am not sure we know what BiPAP settings he is on currently,  If the patient will rather use a BiPAP than try the CPAP of 15-  A prescription for BiPAP  16/12 can be provided and we can get a download from the machine after about 1 to 2 weeks to see how effective it is Pressures can also be adjusted for patient's comfort while keeping a close eye on his residual AHI

## 2018-08-04 NOTE — Telephone Encounter (Signed)
Patient is returning phone call.  Patient phone number is [phone].

## 2018-08-05 NOTE — Telephone Encounter (Signed)
After reading Dr. Namon Cirri response to patient. Pt would like to proceed with Bipap 16/12. Orders placed Nothing further needed.

## 2018-08-11 DIAGNOSIS — G4733 Obstructive sleep apnea (adult) (pediatric): Secondary | ICD-10-CM | POA: Diagnosis not present

## 2018-08-15 ENCOUNTER — Telehealth: Payer: Self-pay | Admitting: Pulmonary Disease

## 2018-08-15 DIAGNOSIS — G4733 Obstructive sleep apnea (adult) (pediatric): Secondary | ICD-10-CM

## 2018-08-15 NOTE — Telephone Encounter (Signed)
Returned patient's call. Old settings were 11/8 New order placed for 16/12. Pt states too much pressure, he can't tolerate. He has went back to old machine until pressures changed.  Please advise of change.

## 2018-08-15 NOTE — Telephone Encounter (Signed)
Can reduce pressure to 12/8 and obtain download in a few weeks to make sure its effective.  Comfort needs to be balanced with efficiency

## 2018-08-15 NOTE — Telephone Encounter (Signed)
Called the patient to let him know if Dr. Judson Roch response to pressure change. He confirmed he recently received machine from [employer]. Told him they should be able to make the change remotely, but if not, they should call him to let him know. Advised him that Dr. Ander Slade requested a download in a few weeks to make sure new setting works for him.   Patient stated he will plug the machine to make sure they can change the settings remotely.  Order placed for pressure setting change. Nothing further needed at this time.

## 2018-08-18 DIAGNOSIS — D045 Carcinoma in situ of skin of trunk: Secondary | ICD-10-CM | POA: Diagnosis not present

## 2018-08-18 DIAGNOSIS — D485 Neoplasm of uncertain behavior of skin: Secondary | ICD-10-CM | POA: Diagnosis not present

## 2018-08-18 DIAGNOSIS — L820 Inflamed seborrheic keratosis: Secondary | ICD-10-CM | POA: Diagnosis not present

## 2018-08-18 DIAGNOSIS — L570 Actinic keratosis: Secondary | ICD-10-CM | POA: Diagnosis not present

## 2018-08-18 DIAGNOSIS — L720 Epidermal cyst: Secondary | ICD-10-CM | POA: Diagnosis not present

## 2018-08-18 DIAGNOSIS — D1801 Hemangioma of skin and subcutaneous tissue: Secondary | ICD-10-CM | POA: Diagnosis not present

## 2018-08-18 DIAGNOSIS — Z85828 Personal history of other malignant neoplasm of skin: Secondary | ICD-10-CM | POA: Diagnosis not present

## 2018-08-18 DIAGNOSIS — L821 Other seborrheic keratosis: Secondary | ICD-10-CM | POA: Diagnosis not present

## 2018-08-18 DIAGNOSIS — L814 Other melanin hyperpigmentation: Secondary | ICD-10-CM | POA: Diagnosis not present

## 2018-08-24 ENCOUNTER — Other Ambulatory Visit: Payer: Self-pay | Admitting: Family Medicine

## 2018-08-24 DIAGNOSIS — K219 Gastro-esophageal reflux disease without esophagitis: Secondary | ICD-10-CM

## 2018-09-10 DIAGNOSIS — G4733 Obstructive sleep apnea (adult) (pediatric): Secondary | ICD-10-CM | POA: Diagnosis not present

## 2018-09-21 ENCOUNTER — Other Ambulatory Visit: Payer: Self-pay | Admitting: Family Medicine

## 2018-09-21 DIAGNOSIS — IMO0001 Reserved for inherently not codable concepts without codable children: Secondary | ICD-10-CM

## 2018-09-21 DIAGNOSIS — R69 Illness, unspecified: Secondary | ICD-10-CM | POA: Diagnosis not present

## 2018-09-25 ENCOUNTER — Other Ambulatory Visit: Payer: Self-pay | Admitting: Family Medicine

## 2018-09-25 DIAGNOSIS — IMO0001 Reserved for inherently not codable concepts without codable children: Secondary | ICD-10-CM

## 2018-09-26 MED ORDER — GLIPIZIDE ER 10 MG PO TB24: 20.0000 mg | ORAL_TABLET | Freq: Every day | ORAL | 0 refills | Status: DC

## 2018-09-29 ENCOUNTER — Encounter: Payer: Self-pay | Admitting: Family Medicine

## 2018-09-30 ENCOUNTER — Other Ambulatory Visit: Payer: Self-pay

## 2018-09-30 ENCOUNTER — Encounter: Payer: Self-pay | Admitting: Family Medicine

## 2018-09-30 ENCOUNTER — Ambulatory Visit (INDEPENDENT_AMBULATORY_CARE_PROVIDER_SITE_OTHER): Payer: Medicare HMO | Admitting: Family Medicine

## 2018-09-30 VITALS — BP 118/70 | HR 90 | Temp 98.3°F | Ht >= 80 in | Wt 291.0 lb

## 2018-09-30 DIAGNOSIS — E669 Obesity, unspecified: Secondary | ICD-10-CM | POA: Diagnosis not present

## 2018-09-30 DIAGNOSIS — Z0000 Encounter for general adult medical examination without abnormal findings: Secondary | ICD-10-CM | POA: Diagnosis not present

## 2018-09-30 DIAGNOSIS — E1169 Type 2 diabetes mellitus with other specified complication: Secondary | ICD-10-CM

## 2018-09-30 LAB — COMPREHENSIVE METABOLIC PANEL
ALT: 24 U/L (ref 0–53)
AST: 23 U/L (ref 0–37)
Albumin: 4.4 g/dL (ref 3.5–5.2)
Alkaline Phosphatase: 44 U/L (ref 39–117)
BUN: 23 mg/dL (ref 6–23)
CO2: 29 mEq/L (ref 19–32)
Calcium: 9.7 mg/dL (ref 8.4–10.5)
Chloride: 104 mEq/L (ref 96–112)
Creatinine, Ser: 1.4 mg/dL (ref 0.40–1.50)
GFR: 49.87 mL/min — ABNORMAL LOW (ref >60.00)
Glucose, Bld: 154 mg/dL — ABNORMAL HIGH (ref 70–99)
Potassium: 4.5 mEq/L (ref 3.5–5.1)
Sodium: 142 mEq/L (ref 135–145)
Total Bilirubin: 0.5 mg/dL (ref 0.2–1.2)
Total Protein: 7.1 g/dL (ref 6.0–8.3)

## 2018-09-30 LAB — MICROALBUMIN / CREATININE URINE RATIO
Creatinine,U: 141.9 mg/dL
Microalb Creat Ratio: 3.5 mg/g (ref 0.0–30.0)
Microalb, Ur: 4.9 mg/dL — ABNORMAL HIGH (ref 0.0–1.9)

## 2018-09-30 LAB — CBC
HCT: 42.8 % (ref 39.0–52.0)
Hemoglobin: 14.5 g/dL (ref 13.0–17.0)
MCHC: 33.8 g/dL (ref 30.0–36.0)
MCV: 91.4 fl (ref 78.0–100.0)
Platelets: 193 10*3/uL (ref 150.0–400.0)
RBC: 4.69 Mil/uL (ref 4.22–5.81)
RDW: 14.2 % (ref 11.5–15.5)
WBC: 5.5 10*3/uL (ref 4.0–10.5)

## 2018-09-30 LAB — HEMOGLOBIN A1C: Hgb A1c MFr Bld: 8.3 % — ABNORMAL HIGH (ref 4.6–6.5)

## 2018-09-30 LAB — LIPID PANEL
Cholesterol: 214 mg/dL — ABNORMAL HIGH (ref 0–200)
HDL: 37.9 mg/dL — ABNORMAL LOW (ref >39.00)
LDL Cholesterol: 150 mg/dL — ABNORMAL HIGH (ref 0–99)
NonHDL: 176.33
Total CHOL/HDL Ratio: 6
Triglycerides: 132 mg/dL (ref 0.0–149.0)
VLDL: 26.4 mg/dL (ref 0.0–40.0)

## 2018-09-30 MED ORDER — CYCLOBENZAPRINE HCL 10 MG PO TABS: 10.0000 mg | ORAL_TABLET | Freq: Every day | ORAL | 3 refills | Status: DC

## 2018-09-30 NOTE — Patient Instructions (Signed)
Give [place] 2-3 business days to get the results of your labs back.   Keep the diet clean and stay active.  Try checking some sugar readings in the evening as well.   Let us know if you need anything.

## 2018-09-30 NOTE — Progress Notes (Signed)
Chief Complaint  Patient presents with  . Annual Exam    Well Male Ronald Barr is here for a complete physical.   His last physical was >1 year ago.  Current diet: in general, a "healthy" diet.   Current exercise: none Weight trend: increasing a little since quanratine Does pt snore? No. Daytime fatigue? No. Seat belt? Yes.    Sugars ranging 130's in AM, does not ck in evening. Last A1c was 8. He is on insulin, 30 u Tresiba at night. Taking PO meds as well. Checks feet, sees podiatry.   Health maintenance Tetanus- Yes HIV- Yes    Past Medical History:  Diagnosis Date  . Cancer [facility])    prostate  . Diabetes mellitus without complication ([place])   . Essential hypertension 03/06/2016  . GERD (gastroesophageal reflux disease)   . Hyperlipidemia   . Sleep apnea    sleeps with BPAP machine     Past Surgical History:  Procedure Laterality Date  . nose and throat surgery     sleep apnes  . SHOULDER SURGERY    . TONSILLECTOMY      Medications  Current Outpatient Medications on File Prior to Visit  Medication Sig Dispense Refill  . amLODipine (NORVASC) 10 MG tablet Take 1 tablet (10 mg total) by mouth daily. 90 tablet 0  . fenofibrate (TRICOR) 145 MG tablet Take 1 tablet (145 mg total) by mouth daily. 90 tablet 0  . fluticasone (FLONASE) 50 MCG/ACT nasal spray Place 2 sprays into both nostrils daily for 7 days. 1 g 0  . glipiZIDE (GLUCOTROL XL) 10 MG 24 hr tablet Take 2 tablets (20 mg total) by mouth daily. 180 tablet 0  . glucose blood (ONETOUCH VERIO) test strip Use as instructed 100 each 12  . insulin degludec (TRESIBA FLEXTOUCH) 100 UNIT/ML SOPN FlexTouch Pen INJECT 0.3 ML UNDER THE SKIN AT BEDTIME 15 pen 6  . Insulin Pen Needle (BD PEN NEEDLE NANO U/F) 32G X 4 MM MISC Use daily with lantus.  DX E11.9 100 each 5  . Insulin Pen Needle (NOVOFINE) 32G X 6 MM MISC To use w/ Tresiba 100 each 1  . IRON PO Take 1 tablet by mouth daily.    Marland Kitchen MAGNESIUM PO Take 1 tablet by  mouth 2 (two) times daily at 10 AM and 5 PM.    . metFORMIN (GLUCOPHAGE) 850 MG tablet Take 1 tablet (850 mg total) by mouth 3 (three) times daily with meals. 270 tablet 1  . Multiple Vitamins-Minerals (MULTIVITAMIN PO) Take 1 tablet by mouth daily.    Marland Kitchen omeprazole (PRILOSEC) 20 MG capsule TAKE 1 CAPSULE BY MOUTH DAILY 90 capsule 0  . ONETOUCH DELICA LANCETS 37T MISC Use once daily to check blood sugar. 100 each 6  . pioglitazone (ACTOS) 30 MG tablet Take 1 tablet (30 mg total) by mouth daily. 90 tablet 0   Allergies Allergies  Allergen Reactions  . Statins     Cramping, intolerant of Crestor and Pravastatin  . Sulfa Antibiotics     Family History Family History  Problem Relation Age of Onset  . Hypertension Mother   . Hypertension Father     Review of Systems: Constitutional: no fevers or chills Eye:  no recent significant change in vision Ear/Nose/Mouth/Throat:  Ears:  no tinnitus or hearing loss Nose/Mouth/Throat:  no complaints of nasal congestion, no sore throat Cardiovascular:  no chest pain, no palpitations Respiratory:  no cough and no shortness of breath Gastrointestinal:  no abdominal  pain, no change in bowel habits GU:  Male: negative for dysuria, frequency, and incontinence and negative for prostate symptoms Musculoskeletal/Extremities:  No new pain, redness, or swelling of the joints Integumentary (Skin/Breast):  no abnormal skin lesions reported Neurologic:  no headaches, no numbness, tingling Endocrine: No unexpected weight changes Hematologic/Lymphatic:  no night sweats  Exam BP 118/70 (BP Location: Left Arm, Patient Position: Sitting, Cuff Size: Large)   Pulse 90   Temp 98.3 F (36.8 C) (Oral)   Ht 6\' 8"  (2.032 m)   Wt 291 lb (132 kg)   SpO2 93%   BMI 31.97 kg/m  General:  well developed, well nourished, in no apparent distress Skin:  no significant moles, warts, or growths Head:  no masses, lesions, or tenderness Eyes:  pupils equal and round,  sclera anicteric without injection Ears:  canals without lesions, TMs shiny without retraction, no obvious effusion, no erythema Nose:  nares patent, septum midline, mucosa normal Throat/Pharynx:  lips and gingiva without lesion; tongue and uvula midline; non-inflamed pharynx; no exudates or postnasal drainage Neck: neck supple without adenopathy, thyromegaly, or masses Lungs:  clear to auscultation, breath sounds equal bilaterally, no respiratory distress Cardio:  regular rate and rhythm, no bruits, no LE edema Abdomen:  abdomen soft, nontender; bowel sounds normal; no masses or organomegaly Rectal: Deferred Musculoskeletal:  symmetrical muscle groups noted without atrophy or deformity Extremities:  no clubbing, cyanosis, or edema, no deformities, no skin discoloration Neuro:  gait normal; deep tendon reflexes normal and symmetric Psych: well oriented with normal range of affect and appropriate judgment/insight  Assessment and Plan  Well adult exam - Plan: Comprehensive metabolic panel, Lipid panel, CBC  Diabetes mellitus type 2 in obese ([place]) - Plan: Microalbumin / creatinine urine ratio, Hemoglobin A1c, HM DIABETES FOOT EXAM   Well 73 y.o. male. Counseled on diet and exercise. Ck sugars in evening. Ck CBC.  Flexeril works great for his nighttime cramps, no AE's. Given body size, OK to cont this.  Other orders as above. Follow up in 1 year pending the above workup. The patient voiced understanding and agreement to the plan.  [place], DO 09/30/18 8:17 AM

## 2018-10-01 DIAGNOSIS — R69 Illness, unspecified: Secondary | ICD-10-CM | POA: Diagnosis not present

## 2018-10-06 ENCOUNTER — Other Ambulatory Visit: Payer: Self-pay

## 2018-10-06 ENCOUNTER — Ambulatory Visit: Payer: Medicare HMO | Admitting: Podiatry

## 2018-10-06 ENCOUNTER — Encounter: Payer: Self-pay | Admitting: Podiatry

## 2018-10-06 VITALS — Temp 98.4°F | Resp 16

## 2018-10-06 DIAGNOSIS — M216X9 Other acquired deformities of unspecified foot: Secondary | ICD-10-CM

## 2018-10-06 DIAGNOSIS — E1149 Type 2 diabetes mellitus with other diabetic neurological complication: Secondary | ICD-10-CM | POA: Diagnosis not present

## 2018-10-06 DIAGNOSIS — M79675 Pain in left toe(s): Secondary | ICD-10-CM | POA: Diagnosis not present

## 2018-10-06 DIAGNOSIS — M79674 Pain in right toe(s): Secondary | ICD-10-CM | POA: Diagnosis not present

## 2018-10-06 DIAGNOSIS — B351 Tinea unguium: Secondary | ICD-10-CM

## 2018-10-06 DIAGNOSIS — Q828 Other specified congenital malformations of skin: Secondary | ICD-10-CM

## 2018-10-09 NOTE — Progress Notes (Signed)
Subjective: 73 y.o. returns the office today for painful calluses to his feet that he will have trimmed as well as for elongated toenails are causing discomfort mostly with shoes.  Denies any redness or drainage of the toenail or callus sites. Denies any open sores.  enies any systemic complaints such as fevers, chills, nausea, vomiting.   PCP: Shelda Pal, DO (last seen 09/30/2018) A1c: 8.3 on 09/30/2018  Objective: AAO 3, NAD DP/PT pulses palpable, CRT less than 3 seconds Hyperkeratotic lesions present bilateral submetatarsal one on file.  No underlying ulceration, drainage or any signs of infection. Nails are hypertrophic, dystrophic, brittle, discolored, elongated 10. No surrounding redness or drainage. Tenderness nails 1-5 bilaterally.  No open lesions or pre-ulcerative lesions are identified. No pain with calf compression, swelling, warmth, erythema.  Assessment: Patient presents with symptomatic hyperkeratotic lesions; symptomatic onychomycosis; 2 uncontrolled type diabetes with neuropathy  Plan: -Treatment options including alternatives, risks, complications were discussed -Hyperkeratotic lesion sharply debrided x4 without any complications or bleeding -Discussed daily foot inspection. If there are any changes, to call the office immediately.  -Paperwork was completed for precertification diabetic shoes.  He does not want the actual shoes.  We discussed inserts to further offload the calluses. -Follow-up in 3 months or sooner if any problems are to arise. In the meantime, encouraged to call the office with any questions, concerns, changes symptoms.  Celesta Gentile, DPM

## 2018-10-10 ENCOUNTER — Encounter: Payer: Self-pay | Admitting: Family Medicine

## 2018-10-11 ENCOUNTER — Other Ambulatory Visit: Payer: Self-pay | Admitting: Family Medicine

## 2018-10-11 DIAGNOSIS — G4733 Obstructive sleep apnea (adult) (pediatric): Secondary | ICD-10-CM | POA: Diagnosis not present

## 2018-10-11 MED ORDER — INSULIN DEGLUDEC 100 UNIT/ML ~~LOC~~ SOPN: PEN_INJECTOR | SUBCUTANEOUS | 6 refills | Status: DC

## 2018-10-14 DIAGNOSIS — Z86010 Personal history of colonic polyps: Secondary | ICD-10-CM | POA: Diagnosis not present

## 2018-10-14 DIAGNOSIS — D128 Benign neoplasm of rectum: Secondary | ICD-10-CM | POA: Diagnosis not present

## 2018-10-14 DIAGNOSIS — K640 First degree hemorrhoids: Secondary | ICD-10-CM | POA: Diagnosis not present

## 2018-10-14 DIAGNOSIS — D126 Benign neoplasm of colon, unspecified: Secondary | ICD-10-CM | POA: Diagnosis not present

## 2018-10-14 DIAGNOSIS — D124 Benign neoplasm of descending colon: Secondary | ICD-10-CM | POA: Diagnosis not present

## 2018-10-14 DIAGNOSIS — K621 Rectal polyp: Secondary | ICD-10-CM | POA: Diagnosis not present

## 2018-10-14 DIAGNOSIS — K641 Second degree hemorrhoids: Secondary | ICD-10-CM | POA: Diagnosis not present

## 2018-10-14 DIAGNOSIS — K6389 Other specified diseases of intestine: Secondary | ICD-10-CM | POA: Diagnosis not present

## 2018-10-14 DIAGNOSIS — Z1211 Encounter for screening for malignant neoplasm of colon: Secondary | ICD-10-CM | POA: Diagnosis not present

## 2018-10-20 DIAGNOSIS — Z01812 Encounter for preprocedural laboratory examination: Secondary | ICD-10-CM | POA: Diagnosis not present

## 2018-10-22 ENCOUNTER — Other Ambulatory Visit: Payer: Self-pay | Admitting: Family Medicine

## 2018-10-22 DIAGNOSIS — E119 Type 2 diabetes mellitus without complications: Secondary | ICD-10-CM

## 2018-10-25 ENCOUNTER — Other Ambulatory Visit: Payer: Self-pay | Admitting: Family Medicine

## 2018-10-25 DIAGNOSIS — E119 Type 2 diabetes mellitus without complications: Secondary | ICD-10-CM

## 2018-11-02 ENCOUNTER — Other Ambulatory Visit: Payer: Self-pay | Admitting: Family Medicine

## 2018-11-10 DIAGNOSIS — Z9079 Acquired absence of other genital organ(s): Secondary | ICD-10-CM | POA: Diagnosis not present

## 2018-11-10 DIAGNOSIS — J849 Interstitial pulmonary disease, unspecified: Secondary | ICD-10-CM | POA: Diagnosis not present

## 2018-11-10 DIAGNOSIS — N400 Enlarged prostate without lower urinary tract symptoms: Secondary | ICD-10-CM | POA: Diagnosis not present

## 2018-11-10 DIAGNOSIS — N200 Calculus of kidney: Secondary | ICD-10-CM | POA: Diagnosis not present

## 2018-11-10 DIAGNOSIS — Z86010 Personal history of colonic polyps: Secondary | ICD-10-CM | POA: Diagnosis not present

## 2018-11-10 DIAGNOSIS — G4733 Obstructive sleep apnea (adult) (pediatric): Secondary | ICD-10-CM | POA: Diagnosis not present

## 2018-11-10 DIAGNOSIS — I700 Atherosclerosis of aorta: Secondary | ICD-10-CM | POA: Diagnosis not present

## 2018-11-10 DIAGNOSIS — Z1211 Encounter for screening for malignant neoplasm of colon: Secondary | ICD-10-CM | POA: Diagnosis not present

## 2018-11-14 ENCOUNTER — Telehealth: Payer: Self-pay | Admitting: Family Medicine

## 2018-11-14 NOTE — Telephone Encounter (Signed)
Copied from [place] [id number]. Topic: General - Inquiry >> Nov 14, 2018 12:45 PM Virl Axe D wrote: Reason for CRM: [employer] Fey is faxing over paperwork regarding pt to Dr. Nani Ravens. Please advise.

## 2018-11-14 NOTE — Telephone Encounter (Signed)
Noted  

## 2018-11-15 ENCOUNTER — Other Ambulatory Visit: Payer: Self-pay | Admitting: Family Medicine

## 2018-11-15 ENCOUNTER — Encounter: Payer: Self-pay | Admitting: Family Medicine

## 2018-11-15 DIAGNOSIS — R69 Illness, unspecified: Secondary | ICD-10-CM | POA: Diagnosis not present

## 2018-11-15 MED ORDER — ONETOUCH DELICA LANCETS 33G MISC: 6 refills | Status: DC

## 2018-11-15 MED ORDER — NOVOFINE 32G X 6 MM MISC: 1 refills | Status: DC

## 2018-11-15 MED ORDER — TRESIBA FLEXTOUCH 100 UNIT/ML ~~LOC~~ SOPN: PEN_INJECTOR | SUBCUTANEOUS | 6 refills | Status: DC

## 2018-11-15 MED ORDER — ONETOUCH VERIO VI STRP: ORAL_STRIP | 12 refills | Status: DC

## 2018-11-16 ENCOUNTER — Telehealth: Payer: Self-pay | Admitting: Family Medicine

## 2018-11-16 NOTE — Telephone Encounter (Signed)
Please place recall letter for 12 mo please. Ty.

## 2018-11-16 NOTE — Telephone Encounter (Signed)
Received imaging study from GI team. Some changes in lungs were seen (not concerned for cancer), but if he is having shortness of breath, we should get some follow up imaging. If not, we can get the imaging in 12 months. Please find out if he is having any sob as that will dictate our next steps. Ty.

## 2018-11-16 NOTE — Telephone Encounter (Signed)
What test needs to be recalled

## 2018-11-16 NOTE — Telephone Encounter (Signed)
Called the patient informed of results/He stated GI had already informed him of results. He stated he is having no symptoms at all///informed then you would followup in 12 months and did tell him to let him know if anything before that changes.

## 2018-11-16 NOTE — Telephone Encounter (Signed)
It's for a CT chest.

## 2018-11-17 ENCOUNTER — Other Ambulatory Visit: Payer: Self-pay

## 2018-11-17 ENCOUNTER — Ambulatory Visit: Payer: Medicare HMO | Admitting: Orthotics

## 2018-11-17 DIAGNOSIS — M2041 Other hammer toe(s) (acquired), right foot: Secondary | ICD-10-CM

## 2018-11-17 DIAGNOSIS — M216X9 Other acquired deformities of unspecified foot: Secondary | ICD-10-CM

## 2018-11-17 DIAGNOSIS — E1149 Type 2 diabetes mellitus with other diabetic neurological complication: Secondary | ICD-10-CM

## 2018-11-17 NOTE — Progress Notes (Signed)
Patient measured for protective DBS inserts today; cast in foam, will send to [employer] to fab.  Docs only in [place].

## 2018-11-26 ENCOUNTER — Other Ambulatory Visit: Payer: Self-pay | Admitting: Family Medicine

## 2018-11-26 DIAGNOSIS — K219 Gastro-esophageal reflux disease without esophagitis: Secondary | ICD-10-CM

## 2018-11-26 DIAGNOSIS — E119 Type 2 diabetes mellitus without complications: Secondary | ICD-10-CM

## 2018-12-01 DIAGNOSIS — M7062 Trochanteric bursitis, left hip: Secondary | ICD-10-CM | POA: Diagnosis not present

## 2018-12-09 ENCOUNTER — Other Ambulatory Visit: Payer: Self-pay

## 2018-12-09 ENCOUNTER — Ambulatory Visit: Payer: Medicare HMO | Admitting: Podiatry

## 2018-12-09 DIAGNOSIS — B351 Tinea unguium: Secondary | ICD-10-CM

## 2018-12-09 DIAGNOSIS — E1149 Type 2 diabetes mellitus with other diabetic neurological complication: Secondary | ICD-10-CM | POA: Diagnosis not present

## 2018-12-09 DIAGNOSIS — M216X9 Other acquired deformities of unspecified foot: Secondary | ICD-10-CM | POA: Diagnosis not present

## 2018-12-09 DIAGNOSIS — Q828 Other specified congenital malformations of skin: Secondary | ICD-10-CM | POA: Diagnosis not present

## 2018-12-09 DIAGNOSIS — M79675 Pain in left toe(s): Secondary | ICD-10-CM | POA: Diagnosis not present

## 2018-12-09 DIAGNOSIS — M79674 Pain in right toe(s): Secondary | ICD-10-CM

## 2018-12-09 NOTE — Progress Notes (Signed)
Subjective: 73 y.o. returns the office today for painful calluses to his feet that he will have trimmed as well as for elongated toenails are causing discomfort mostly with shoes.  Denies any redness or drainage of the toenail or callus sites.  Also presents today to pick up inserts.  Denies any open sores.  enies any systemic complaints such as fevers, chills, nausea, vomiting.   PCP: Shelda Pal, DO (last seen 09/30/2018) A1c: 8.3 on 09/30/2018  Objective: AAO 3, NAD DP/PT pulses palpable, CRT less than 3 seconds Hyperkeratotic lesions present bilateral submetatarsal one on file.  No underlying ulceration, drainage or any signs of infection. Nails are hypertrophic, dystrophic, brittle, discolored, elongated 10. No surrounding redness or drainage. Tenderness nails 1-5 bilaterally.  No open lesions or pre-ulcerative lesions are identified. No pain with calf compression, swelling, warmth, erythema.  Assessment: Patient presents with symptomatic hyperkeratotic lesions; symptomatic onychomycosis; 2 uncontrolled type diabetes with neuropathy  Plan: -Treatment options including alternatives, risks, complications were discussed -Hyperkeratotic lesion sharply debrided x4 without any complications or bleeding -Discussed daily foot inspection. If there are any changes, to call the office immediately.  -Inserts were dispensed today.  Modified them to offload the fifth metatarsal heads. -Follow-up with Liliane Channel as well to make sure they are fitting appropriately.  Break-in instructions discussed. -Follow-up in 3 months or sooner if any problems are to arise. In the meantime, encouraged to call the office with any questions, concerns, changes symptoms.  Celesta Gentile, DPM

## 2018-12-11 DIAGNOSIS — G4733 Obstructive sleep apnea (adult) (pediatric): Secondary | ICD-10-CM | POA: Diagnosis not present

## 2018-12-22 ENCOUNTER — Other Ambulatory Visit: Payer: Self-pay

## 2018-12-22 ENCOUNTER — Ambulatory Visit: Payer: Medicare HMO | Admitting: Orthotics

## 2018-12-22 DIAGNOSIS — M2041 Other hammer toe(s) (acquired), right foot: Secondary | ICD-10-CM

## 2018-12-22 DIAGNOSIS — M2042 Other hammer toe(s) (acquired), left foot: Secondary | ICD-10-CM

## 2018-12-22 DIAGNOSIS — E1149 Type 2 diabetes mellitus with other diabetic neurological complication: Secondary | ICD-10-CM

## 2018-12-22 DIAGNOSIS — Q828 Other specified congenital malformations of skin: Secondary | ICD-10-CM

## 2018-12-22 DIAGNOSIS — M216X9 Other acquired deformities of unspecified foot: Secondary | ICD-10-CM

## 2018-12-22 NOTE — Progress Notes (Signed)
F/O DBS needs to be sent back to [employer] for offloading 5th Met and increasing ARCH

## 2018-12-29 DIAGNOSIS — Z85828 Personal history of other malignant neoplasm of skin: Secondary | ICD-10-CM | POA: Diagnosis not present

## 2018-12-29 DIAGNOSIS — R69 Illness, unspecified: Secondary | ICD-10-CM | POA: Diagnosis not present

## 2018-12-29 DIAGNOSIS — L720 Epidermal cyst: Secondary | ICD-10-CM | POA: Diagnosis not present

## 2019-01-03 DIAGNOSIS — R69 Illness, unspecified: Secondary | ICD-10-CM | POA: Diagnosis not present

## 2019-01-04 ENCOUNTER — Other Ambulatory Visit: Payer: Self-pay

## 2019-01-04 DIAGNOSIS — R69 Illness, unspecified: Secondary | ICD-10-CM | POA: Diagnosis not present

## 2019-01-05 ENCOUNTER — Other Ambulatory Visit: Payer: Medicare HMO | Admitting: Orthotics

## 2019-01-06 ENCOUNTER — Ambulatory Visit (INDEPENDENT_AMBULATORY_CARE_PROVIDER_SITE_OTHER): Payer: Medicare HMO | Admitting: Family Medicine

## 2019-01-06 ENCOUNTER — Encounter: Payer: Self-pay | Admitting: Family Medicine

## 2019-01-06 ENCOUNTER — Other Ambulatory Visit: Payer: Self-pay

## 2019-01-06 VITALS — BP 132/78 | HR 87 | Temp 97.8°F | Ht >= 80 in | Wt 287.4 lb

## 2019-01-06 DIAGNOSIS — E1169 Type 2 diabetes mellitus with other specified complication: Secondary | ICD-10-CM | POA: Diagnosis not present

## 2019-01-06 DIAGNOSIS — E669 Obesity, unspecified: Secondary | ICD-10-CM | POA: Diagnosis not present

## 2019-01-06 LAB — HEMOGLOBIN A1C: Hgb A1c MFr Bld: 7.2 % — ABNORMAL HIGH (ref 4.6–6.5)

## 2019-01-06 NOTE — Patient Instructions (Addendum)
I recommend getting the flu shot in mid October. This suggestion would change if the CDC comes out with a different recommendation.   Give us 2-3 business days to get the results of your labs back.   Keep the diet clean and stay active.  Let us know if you need anything.  

## 2019-01-06 NOTE — Progress Notes (Signed)
Subjective:   Chief Complaint  Patient presents with  . Follow-up    Ronald Barr is a 73 y.o. male here for follow-up of diabetes.   Har[place]'s self monitored glucose range is low-mid 100's.  Patient denies hypoglycemic reactions. He checks his glucose levels 2 times per day. Patient does require insulin.  Tresiba 30 u at night, 10 u in AM Medications include: Glipizide, Actos, metformin Exercise: Golf  Past Medical History:  Diagnosis Date  . Cancer [facility])    prostate  . Diabetes mellitus without complication ([place])   . Essential hypertension 03/06/2016  . GERD (gastroesophageal reflux disease)   . Hyperlipidemia   . Sleep apnea    sleeps with BPAP machine     Related testing: Date of retinal exam: Done Pneumovax: done  Review of Systems: Pulmonary:  No SOB Cardiovascular:  No chest pain  Objective:  BP 132/78 (BP Location: Left Arm, Patient Position: Sitting, Cuff Size: Normal)   Pulse 87   Temp 97.8 F (36.6 C) (Temporal)   Ht 6\' 8"  (2.032 m)   Wt 287 lb 6 oz (130.4 kg)   SpO2 93%   BMI 31.57 kg/m  General:  Well developed, well nourished, in no apparent distress Skin:  Warm, no pallor or diaphoresis Head:  Normocephalic, atraumatic Eyes:  Pupils equal and round, sclera anicteric without injection  Lungs:  CTAB, no access msc use Cardio:  RRR, no bruits, trace b/l LE edema Psych: Age appropriate judgment and insight  Assessment:   Diabetes mellitus type 2 in obese ([place]) - Plan: Hemoglobin A1c   Plan:   Uncontrolled, A1c goal is <8 Counseled on diet and exercise. F/u in 3 mo. The patient voiced understanding and agreement to the plan.  [place], DO 01/06/19 9:35 AM

## 2019-01-09 ENCOUNTER — Other Ambulatory Visit: Payer: Self-pay | Admitting: Family Medicine

## 2019-01-09 DIAGNOSIS — IMO0001 Reserved for inherently not codable concepts without codable children: Secondary | ICD-10-CM

## 2019-01-10 MED ORDER — GLIPIZIDE ER 10 MG PO TB24: 20.0000 mg | ORAL_TABLET | Freq: Every day | ORAL | 1 refills | Status: DC

## 2019-01-11 DIAGNOSIS — G4733 Obstructive sleep apnea (adult) (pediatric): Secondary | ICD-10-CM | POA: Diagnosis not present

## 2019-01-12 ENCOUNTER — Ambulatory Visit: Payer: Medicare HMO | Admitting: Orthotics

## 2019-01-12 ENCOUNTER — Other Ambulatory Visit: Payer: Self-pay

## 2019-01-12 DIAGNOSIS — M2041 Other hammer toe(s) (acquired), right foot: Secondary | ICD-10-CM

## 2019-01-12 DIAGNOSIS — E1149 Type 2 diabetes mellitus with other diabetic neurological complication: Secondary | ICD-10-CM

## 2019-01-12 DIAGNOSIS — M2042 Other hammer toe(s) (acquired), left foot: Secondary | ICD-10-CM

## 2019-01-12 NOTE — Progress Notes (Signed)
Patient picked up adjusted DBS inserts with appropriate offloads.

## 2019-01-21 ENCOUNTER — Other Ambulatory Visit: Payer: Self-pay | Admitting: Family Medicine

## 2019-01-21 DIAGNOSIS — E119 Type 2 diabetes mellitus without complications: Secondary | ICD-10-CM

## 2019-01-23 MED ORDER — FENOFIBRATE 145 MG PO TABS: 145.0000 mg | ORAL_TABLET | Freq: Every day | ORAL | 1 refills | Status: DC

## 2019-01-26 ENCOUNTER — Other Ambulatory Visit: Payer: Self-pay | Admitting: Family Medicine

## 2019-02-02 ENCOUNTER — Other Ambulatory Visit: Payer: Self-pay

## 2019-02-02 ENCOUNTER — Ambulatory Visit (INDEPENDENT_AMBULATORY_CARE_PROVIDER_SITE_OTHER): Payer: Medicare HMO

## 2019-02-02 DIAGNOSIS — Z23 Encounter for immunization: Secondary | ICD-10-CM | POA: Diagnosis not present

## 2019-02-02 NOTE — Progress Notes (Signed)
Here for flu shot

## 2019-02-10 DIAGNOSIS — G4733 Obstructive sleep apnea (adult) (pediatric): Secondary | ICD-10-CM | POA: Diagnosis not present

## 2019-02-16 ENCOUNTER — Ambulatory Visit: Payer: Medicare HMO | Admitting: Podiatry

## 2019-02-16 ENCOUNTER — Other Ambulatory Visit: Payer: Self-pay

## 2019-02-16 DIAGNOSIS — E1149 Type 2 diabetes mellitus with other diabetic neurological complication: Secondary | ICD-10-CM | POA: Diagnosis not present

## 2019-02-16 DIAGNOSIS — B351 Tinea unguium: Secondary | ICD-10-CM | POA: Diagnosis not present

## 2019-02-16 DIAGNOSIS — Q828 Other specified congenital malformations of skin: Secondary | ICD-10-CM | POA: Diagnosis not present

## 2019-02-16 DIAGNOSIS — M79675 Pain in left toe(s): Secondary | ICD-10-CM | POA: Diagnosis not present

## 2019-02-16 DIAGNOSIS — M79674 Pain in right toe(s): Secondary | ICD-10-CM | POA: Diagnosis not present

## 2019-02-22 ENCOUNTER — Other Ambulatory Visit: Payer: Self-pay | Admitting: Family Medicine

## 2019-02-22 DIAGNOSIS — R69 Illness, unspecified: Secondary | ICD-10-CM | POA: Diagnosis not present

## 2019-02-26 ENCOUNTER — Other Ambulatory Visit: Payer: Self-pay | Admitting: Family Medicine

## 2019-02-26 DIAGNOSIS — E119 Type 2 diabetes mellitus without complications: Secondary | ICD-10-CM

## 2019-02-26 DIAGNOSIS — K219 Gastro-esophageal reflux disease without esophagitis: Secondary | ICD-10-CM

## 2019-03-05 NOTE — Progress Notes (Signed)
Subjective: 73 y.o. returns the office today for reevaluation of painful calluses to his feet that he will have trimmed as well as for elongated toenails are causing discomfort mostly with shoes.  Denies any redness or drainage of the toenail or callus sites. Denies any open sores.  enies any systemic complaints such as fevers, chills, nausea, vomiting.   PCP: Shelda Pal, DO (last seen 09/30/2018) A1c: 7.2 on 01/06/2019  Objective: AAO 3, NAD DP/PT pulses palpable, CRT less than 3 seconds Hyperkeratotic lesions present bilateral submetatarsal one on file.  No underlying ulceration, drainage or any signs of infection. Nails are hypertrophic, dystrophic, brittle, discolored, elongated 10. No surrounding redness or drainage. Tenderness nails 1-5 bilaterally.  No open lesions or pre-ulcerative lesions are identified. No pain with calf compression, swelling, warmth, erythema.  Assessment: Patient presents with symptomatic hyperkeratotic lesions; symptomatic onychomycosis; 2 uncontrolled type diabetes with neuropathy  Plan: -Treatment options including alternatives, risks, complications were discussed -Hyperkeratotic lesion sharply debrided x4 without any complications or bleeding -Nails debrided x10 without any complications or bleeding. -Discussed daily foot inspection. If there are any changes, to call the office immediately.  -Follow-up in 3 months or sooner if any problems are to arise. In the meantime, encouraged to call the office with any questions, concerns, changes symptoms.  Celesta Gentile, DPM

## 2019-03-13 DIAGNOSIS — G4733 Obstructive sleep apnea (adult) (pediatric): Secondary | ICD-10-CM | POA: Diagnosis not present

## 2019-03-16 DIAGNOSIS — R69 Illness, unspecified: Secondary | ICD-10-CM | POA: Diagnosis not present

## 2019-03-20 DIAGNOSIS — R05 Cough: Secondary | ICD-10-CM | POA: Diagnosis not present

## 2019-03-20 DIAGNOSIS — Z20828 Contact with and (suspected) exposure to other viral communicable diseases: Secondary | ICD-10-CM | POA: Diagnosis not present

## 2019-04-05 ENCOUNTER — Encounter: Payer: Self-pay | Admitting: Family Medicine

## 2019-04-05 DIAGNOSIS — IMO0002 Reserved for concepts with insufficient information to code with codable children: Secondary | ICD-10-CM

## 2019-04-05 DIAGNOSIS — E1165 Type 2 diabetes mellitus with hyperglycemia: Secondary | ICD-10-CM

## 2019-04-05 MED ORDER — GLIPIZIDE ER 10 MG PO TB24: 20.0000 mg | ORAL_TABLET | Freq: Every day | ORAL | 1 refills | Status: DC

## 2019-04-11 ENCOUNTER — Other Ambulatory Visit: Payer: Self-pay | Admitting: Family Medicine

## 2019-04-12 DIAGNOSIS — G4733 Obstructive sleep apnea (adult) (pediatric): Secondary | ICD-10-CM | POA: Diagnosis not present

## 2019-04-14 ENCOUNTER — Ambulatory Visit (INDEPENDENT_AMBULATORY_CARE_PROVIDER_SITE_OTHER): Payer: Medicare HMO | Admitting: Family Medicine

## 2019-04-14 ENCOUNTER — Encounter: Payer: Self-pay | Admitting: Family Medicine

## 2019-04-14 ENCOUNTER — Ambulatory Visit: Payer: Medicare HMO | Admitting: Family Medicine

## 2019-04-14 ENCOUNTER — Other Ambulatory Visit: Payer: Self-pay

## 2019-04-14 DIAGNOSIS — E669 Obesity, unspecified: Secondary | ICD-10-CM

## 2019-04-14 DIAGNOSIS — E1169 Type 2 diabetes mellitus with other specified complication: Secondary | ICD-10-CM | POA: Diagnosis not present

## 2019-04-14 MED ORDER — TRESIBA FLEXTOUCH 100 UNIT/ML ~~LOC~~ SOPN: PEN_INJECTOR | SUBCUTANEOUS | 6 refills | Status: DC

## 2019-04-14 MED ORDER — PRAVASTATIN SODIUM 10 MG PO TABS: 10.0000 mg | ORAL_TABLET | Freq: Every day | ORAL | 1 refills | Status: DC

## 2019-04-14 NOTE — Progress Notes (Signed)
Subjective:   Chief Complaint  Patient presents with  . Follow-up    Ronald Barr is a 73 y.o. male here for follow-up of diabetes.   Due to COVID-19 pandemic, we are interacting via telephone. I verified patient's ID using 2 identifiers. Patient agreed to proceed with visit via this method. Patient is at home, I am at office. Patient and I are present for visit.  Ronald Barr's self monitored glucose range is low 100's Patient denies hypoglycemic reactions. He checks his glucose levels 1 time per day. Patient does require insulin-Tresiba 30 units in the evening and 10 units in the morning.   Medications include: Metformin 850 mg 3 times daily, Actos 30 mg daily, glipizide 20 mg daily Exercise: Tries to get some walking Diet is fair He has failed numerous statins due to increased cramping, however once he started cyclobenzaprine, this is largely improved.  Past Medical History:  Diagnosis Date  . Cancer [facility])    prostate  . Diabetes mellitus without complication ([place])   . Essential hypertension 03/06/2016  . GERD (gastroesophageal reflux disease)   . Hyperlipidemia   . Sleep apnea    sleeps with BPAP machine     Review of Systems: Pulmonary:  No SOB Cardiovascular:  No chest pain  Objective:  No conversational dyspnea Age appropriate judgment and insight Nml affect and mood  Assessment:   Diabetes mellitus type 2 in obese ([place]) - Plan: HgB A1c   Plan:   Ck A1c, start low dose statin. If it causes cramping, let us know. I am pleased with his progress since starting Tresiba.  Counseled on diet and exercise. Total time spent: 11 min F/u in 6 mo. The patient voiced understanding and agreement to the plan.  [place], DO 04/14/19 12:01 PM

## 2019-04-18 DIAGNOSIS — R69 Illness, unspecified: Secondary | ICD-10-CM | POA: Diagnosis not present

## 2019-04-20 ENCOUNTER — Ambulatory Visit: Payer: Medicare HMO | Admitting: Podiatry

## 2019-04-20 ENCOUNTER — Other Ambulatory Visit: Payer: Self-pay

## 2019-04-20 ENCOUNTER — Ambulatory Visit: Payer: Medicare HMO | Admitting: Orthotics

## 2019-04-20 DIAGNOSIS — Q828 Other specified congenital malformations of skin: Secondary | ICD-10-CM

## 2019-04-20 DIAGNOSIS — B351 Tinea unguium: Secondary | ICD-10-CM

## 2019-04-20 DIAGNOSIS — E1149 Type 2 diabetes mellitus with other diabetic neurological complication: Secondary | ICD-10-CM

## 2019-04-20 DIAGNOSIS — M2041 Other hammer toe(s) (acquired), right foot: Secondary | ICD-10-CM

## 2019-04-20 DIAGNOSIS — M2042 Other hammer toe(s) (acquired), left foot: Secondary | ICD-10-CM

## 2019-04-20 NOTE — Progress Notes (Signed)
Offloaded 1/5 diabetic inserts.

## 2019-04-25 ENCOUNTER — Other Ambulatory Visit: Payer: Self-pay | Admitting: Family Medicine

## 2019-04-27 NOTE — Progress Notes (Signed)
Subjective: 73 y.o. returns the office today for reevaluation of painful calluses to his feet that he will have trimmed. Denies any redness or drainage of the toenail or callus sites. Denies any open sores.  enies any systemic complaints such as fevers, chills, nausea, vomiting.   PCP: Shelda Pal, DO (last seen 04/14/2019 A1c: 7.2 on 01/06/2019  Objective: AAO 3, NAD DP/PT pulses palpable, CRT less than 3 seconds Hyperkeratotic lesions present bilateral submetatarsal one and 5 b/l.  No underlying ulceration, drainage or any signs of infection. No open lesions or pre-ulcerative lesions are identified. No pain with calf compression, swelling, warmth, erythema.  Assessment: Patient presents with symptomatic hyperkeratotic lesions; 2 uncontrolled type diabetes with neuropathy  Plan: -Treatment options including alternatives, risks, complications were discussed -Hyperkeratotic lesion sharply debrided x4 without any complications or bleeding -Discussed daily foot inspection. If there are any changes, to call the office immediately.  -Follow-up in 3 months or sooner if any problems are to arise. In the meantime, encouraged to call the office with any questions, concerns, changes symptoms.  Celesta Gentile, DPM

## 2019-05-03 ENCOUNTER — Telehealth: Payer: Self-pay | Admitting: Family Medicine

## 2019-05-03 NOTE — Telephone Encounter (Signed)
Patient declined AWV at this time.

## 2019-05-06 ENCOUNTER — Other Ambulatory Visit: Payer: Self-pay | Admitting: Family Medicine

## 2019-05-13 ENCOUNTER — Encounter: Payer: Self-pay | Admitting: Family Medicine

## 2019-05-13 DIAGNOSIS — G4733 Obstructive sleep apnea (adult) (pediatric): Secondary | ICD-10-CM | POA: Diagnosis not present

## 2019-05-15 ENCOUNTER — Other Ambulatory Visit: Payer: Self-pay | Admitting: Family Medicine

## 2019-05-15 DIAGNOSIS — R69 Illness, unspecified: Secondary | ICD-10-CM | POA: Diagnosis not present

## 2019-05-15 DIAGNOSIS — M7062 Trochanteric bursitis, left hip: Secondary | ICD-10-CM | POA: Diagnosis not present

## 2019-05-15 MED ORDER — ONETOUCH VERIO VI STRP: ORAL_STRIP | 12 refills | Status: DC

## 2019-05-15 MED ORDER — ONETOUCH DELICA PLUS LANCET33G MISC: 1.0000 | Freq: Two times a day (BID) | 5 refills | Status: DC

## 2019-05-29 ENCOUNTER — Encounter: Payer: Self-pay | Admitting: Family Medicine

## 2019-05-29 ENCOUNTER — Other Ambulatory Visit: Payer: Self-pay | Admitting: Family Medicine

## 2019-05-30 ENCOUNTER — Other Ambulatory Visit: Payer: Self-pay | Admitting: Family Medicine

## 2019-05-30 DIAGNOSIS — K219 Gastro-esophageal reflux disease without esophagitis: Secondary | ICD-10-CM

## 2019-06-06 ENCOUNTER — Encounter: Payer: Self-pay | Admitting: Family Medicine

## 2019-06-06 DIAGNOSIS — E119 Type 2 diabetes mellitus without complications: Secondary | ICD-10-CM

## 2019-06-07 MED ORDER — PRAVASTATIN SODIUM 10 MG PO TABS: 10.0000 mg | ORAL_TABLET | Freq: Every day | ORAL | 1 refills | Status: DC

## 2019-06-07 MED ORDER — METFORMIN HCL 850 MG PO TABS: ORAL_TABLET | ORAL | 1 refills | Status: DC

## 2019-06-15 DIAGNOSIS — R69 Illness, unspecified: Secondary | ICD-10-CM | POA: Diagnosis not present

## 2019-06-22 ENCOUNTER — Telehealth: Payer: Self-pay

## 2019-06-24 ENCOUNTER — Ambulatory Visit: Payer: Medicare HMO | Attending: Internal Medicine

## 2019-06-24 DIAGNOSIS — Z23 Encounter for immunization: Secondary | ICD-10-CM

## 2019-06-24 NOTE — Progress Notes (Signed)
   Covid-19 Vaccination Clinic  Name:  Ronald Barr    MRN: [id number] DOB: 06-18-1945  06/24/2019  Ronald Barr was observed post Covid-19 immunization for 15 minutes without incidence. He was provided with Vaccine Information Sheet and instruction to access the V-Safe system.   Ronald Barr was instructed to call 911 with any severe reactions post vaccine: Marland Kitchen Difficulty breathing  . Swelling of your face and throat  . A fast heartbeat  . A bad rash all over your body  . Dizziness and weakness    Immunizations Administered    Name Date Dose VIS Date Route   Pfizer COVID-19 Vaccine 06/24/2019  2:41 PM 0.3 mL 04/21/2019 Intramuscular   Manufacturer: [place]   Lot: [id number]   [place]: [id number]

## 2019-06-29 ENCOUNTER — Ambulatory Visit: Payer: Medicare HMO | Admitting: Podiatry

## 2019-06-29 DIAGNOSIS — G4733 Obstructive sleep apnea (adult) (pediatric): Secondary | ICD-10-CM | POA: Diagnosis not present

## 2019-07-08 DIAGNOSIS — R69 Illness, unspecified: Secondary | ICD-10-CM | POA: Diagnosis not present

## 2019-07-13 ENCOUNTER — Ambulatory Visit: Payer: Medicare HMO | Admitting: Podiatry

## 2019-07-13 ENCOUNTER — Other Ambulatory Visit: Payer: Self-pay

## 2019-07-13 DIAGNOSIS — E1149 Type 2 diabetes mellitus with other diabetic neurological complication: Secondary | ICD-10-CM

## 2019-07-13 DIAGNOSIS — Q828 Other specified congenital malformations of skin: Secondary | ICD-10-CM | POA: Diagnosis not present

## 2019-07-17 ENCOUNTER — Ambulatory Visit: Payer: Medicare HMO | Attending: Internal Medicine

## 2019-07-17 DIAGNOSIS — Z23 Encounter for immunization: Secondary | ICD-10-CM | POA: Insufficient documentation

## 2019-07-17 NOTE — Progress Notes (Signed)
   Covid-19 Vaccination Clinic  Name:  Ronald Barr    MRN: [id number] DOB: 10-13-1945  07/17/2019  [name] was observed post Covid-19 immunization for 15 minutes without incident. He was provided with Vaccine Information Sheet and instruction to access the V-Safe system.   [name] was instructed to call 911 with any severe reactions post vaccine: Marland Kitchen Difficulty breathing  . Swelling of face and throat  . A fast heartbeat  . A bad rash all over body  . Dizziness and weakness   Immunizations Administered    Name Date Dose VIS Date Route   Pfizer COVID-19 Vaccine 07/17/2019  2:46 PM 0.3 mL 04/21/2019 Intramuscular   Manufacturer: [place]   Lot: [id number]   [place]: [id number]

## 2019-07-20 ENCOUNTER — Other Ambulatory Visit: Payer: Self-pay | Admitting: Family Medicine

## 2019-07-20 DIAGNOSIS — E119 Type 2 diabetes mellitus without complications: Secondary | ICD-10-CM

## 2019-07-21 MED ORDER — NOVOFINE 32G X 6 MM MISC: 0 refills | Status: DC

## 2019-07-21 MED ORDER — FENOFIBRATE 145 MG PO TABS: 145.0000 mg | ORAL_TABLET | Freq: Every day | ORAL | 0 refills | Status: DC

## 2019-07-23 DIAGNOSIS — R69 Illness, unspecified: Secondary | ICD-10-CM | POA: Diagnosis not present

## 2019-07-24 NOTE — Progress Notes (Signed)
Subjective: 74 y.o. returns the office today for reevaluation of painful calluses to his feet that he will have trimmed. Denies any redness or drainage of the toenail or callus sites. Denies any open sores.  enies any systemic complaints such as fevers, chills, nausea, vomiting.   PCP: Shelda Pal, DO (last seen 09/30/2018) A1c: 7.2 on 01/06/2019  Objective: AAO 3, NAD DP/PT pulses palpable, CRT less than 3 seconds Hyperkeratotic lesions present bilateral submetatarsal one and 5 bilaterally.  No underlying ulceration, drainage or any signs of infection. No open lesions or pre-ulcerative lesions are identified. No pain with calf compression, swelling, warmth, erythema.  Assessment: Patient presents with symptomatic hyperkeratotic lesions; type 2 diabetes with neuropathy   Plan: -Treatment options including alternatives, risks, complications were discussed -Hyperkeratotic lesion sharply debrided x4 without any complications or bleeding -Discussed daily foot inspection. If there are any changes, to call the office immediately.  -Follow-up as scheduled or sooner if any problems are to arise. In the meantime, encouraged to call the office with any questions, concerns, changes symptoms.  Celesta Gentile, DPM

## 2019-08-01 DIAGNOSIS — R69 Illness, unspecified: Secondary | ICD-10-CM | POA: Diagnosis not present

## 2019-08-27 DIAGNOSIS — R69 Illness, unspecified: Secondary | ICD-10-CM | POA: Diagnosis not present

## 2019-08-30 ENCOUNTER — Other Ambulatory Visit: Payer: Self-pay | Admitting: Family Medicine

## 2019-08-30 DIAGNOSIS — K219 Gastro-esophageal reflux disease without esophagitis: Secondary | ICD-10-CM

## 2019-09-18 ENCOUNTER — Ambulatory Visit (INDEPENDENT_AMBULATORY_CARE_PROVIDER_SITE_OTHER): Payer: Medicare HMO | Admitting: Podiatry

## 2019-09-18 ENCOUNTER — Other Ambulatory Visit: Payer: Self-pay

## 2019-09-18 ENCOUNTER — Encounter: Payer: Self-pay | Admitting: Podiatry

## 2019-09-18 DIAGNOSIS — Q828 Other specified congenital malformations of skin: Secondary | ICD-10-CM

## 2019-09-18 DIAGNOSIS — E1149 Type 2 diabetes mellitus with other diabetic neurological complication: Secondary | ICD-10-CM

## 2019-09-19 NOTE — Progress Notes (Signed)
Subjective: 74 y.o. returns the office today for continued care of painful calluses to his feet that he will have trimmed.  The right side is worse than left. Denies any redness or drainage of the toenail or callus sites. Denies any open sores.  enies any systemic complaints such as fevers, chills, nausea, vomiting.   PCP: Shelda Pal, DO (last seen 04/14/2019)  A1c: 7.2 on 01/06/2019  Objective: AAO 3, NAD DP/PT pulses palpable, CRT less than 3 seconds Hyperkeratotic lesions present bilateral submetatarsal one and 5 bilaterally.  No underlying ulceration, drainage or any signs of infection. No open lesions or pre-ulcerative lesions are identified. No pain with calf compression, swelling, warmth, erythema.  Assessment: Patient presents with symptomatic hyperkeratotic lesions; type 2 diabetes with neuropathy   Plan: -Treatment options including alternatives, risks, complications were discussed -Hyperkeratotic lesion sharply debrided x4 without any complications or bleeding -Discussed daily foot inspection. If there are any changes, to call the office immediately.  -Follow-up as scheduled or sooner if any problems are to arise. In the meantime, encouraged to call the office with any questions, concerns, changes symptoms.  Celesta Gentile, DPM

## 2019-09-23 ENCOUNTER — Other Ambulatory Visit: Payer: Self-pay | Admitting: Family Medicine

## 2019-09-23 DIAGNOSIS — R69 Illness, unspecified: Secondary | ICD-10-CM | POA: Diagnosis not present

## 2019-09-29 DIAGNOSIS — G4733 Obstructive sleep apnea (adult) (pediatric): Secondary | ICD-10-CM | POA: Diagnosis not present

## 2019-10-17 ENCOUNTER — Other Ambulatory Visit: Payer: Self-pay | Admitting: Family Medicine

## 2019-10-17 ENCOUNTER — Other Ambulatory Visit: Payer: Self-pay

## 2019-10-17 DIAGNOSIS — E119 Type 2 diabetes mellitus without complications: Secondary | ICD-10-CM

## 2019-10-17 DIAGNOSIS — R69 Illness, unspecified: Secondary | ICD-10-CM | POA: Diagnosis not present

## 2019-10-17 MED ORDER — NOVOFINE 32G X 6 MM MISC: 2 refills | Status: DC

## 2019-10-30 DIAGNOSIS — G4733 Obstructive sleep apnea (adult) (pediatric): Secondary | ICD-10-CM | POA: Diagnosis not present

## 2019-11-03 ENCOUNTER — Ambulatory Visit (INDEPENDENT_AMBULATORY_CARE_PROVIDER_SITE_OTHER): Payer: Medicare HMO

## 2019-11-03 ENCOUNTER — Ambulatory Visit: Payer: Medicare HMO | Admitting: Podiatry

## 2019-11-03 ENCOUNTER — Encounter: Payer: Self-pay | Admitting: Podiatry

## 2019-11-03 ENCOUNTER — Other Ambulatory Visit: Payer: Self-pay

## 2019-11-03 DIAGNOSIS — M779 Enthesopathy, unspecified: Secondary | ICD-10-CM | POA: Diagnosis not present

## 2019-11-03 DIAGNOSIS — M79672 Pain in left foot: Secondary | ICD-10-CM | POA: Diagnosis not present

## 2019-11-03 DIAGNOSIS — E1149 Type 2 diabetes mellitus with other diabetic neurological complication: Secondary | ICD-10-CM

## 2019-11-03 DIAGNOSIS — M7989 Other specified soft tissue disorders: Secondary | ICD-10-CM | POA: Diagnosis not present

## 2019-11-03 DIAGNOSIS — R2242 Localized swelling, mass and lump, left lower limb: Secondary | ICD-10-CM | POA: Diagnosis not present

## 2019-11-03 DIAGNOSIS — M1000 Idiopathic gout, unspecified site: Secondary | ICD-10-CM

## 2019-11-03 MED ORDER — COLCHICINE 0.6 MG PO TABS: 0.6000 mg | ORAL_TABLET | Freq: Every day | ORAL | 0 refills | Status: DC

## 2019-11-03 NOTE — Patient Instructions (Signed)
Colchicine oral solution What is this medicine? COLCHICINE (KOL chi seen) is used to prevent attacks of acute gout or gouty arthritis. This medicine may be used for other purposes; ask your health care provider or pharmacist if you have questions. COMMON BRAND NAME(S): GLOPERBA What should I tell my health care provider before I take this medicine? They need to know if you have any of these conditions:  kidney disease  liver disease  an unusual or allergic reaction to colchicine, other medicines, foods, dyes, or preservatives  pregnant or trying to get pregnant  breast-feeding How should I use this medicine? Take this medicine by mouth. Follow the directions on the prescription label. Use a specially marked oral syringe, spoon, or dropper to measure each dose. Ask your pharmacist if you do not have one. Household spoons are not accurate. You can take it with or without food. If it upsets your stomach, take it with food. Take your medicine at regular intervals. Do not take it more often than directed. Do not stop taking except on your doctor's advice. A special MedGuide will be given to you by the pharmacist with each prescription and refill. Be sure to read this information carefully each time. Talk to your pediatrician about the use of this medicine in children. Special care may be needed. Patients over 23 years old may have a stronger reaction and need a smaller dose. Overdosage: If you think you have taken too much of this medicine contact a poison control center or emergency room at once. NOTE: This medicine is only for you. Do not share this medicine with others. What if I miss a dose? If you miss a dose, take it as soon as you can. If it is almost time for your next dose, take only that dose. Do not take double or extra doses. What may interact with this medicine? Do not take this medicine with any of the following medications:  certain antivirals for HIV or hepatitis This  medicine may also interact with the following medications:  certain antibiotics like erythromycin or clarithromycin  certain medicines for blood pressure, heart disease, irregular heart beat  certain medicines for cholesterol like atorvastatin, lovastatin, and simvastatin  certain medicines for fungal infections like ketoconazole, itraconazole, or posaconazole  cyclosporine  grapefruit or grapefruit juice This list may not describe all possible interactions. Give your health care provider a list of all the medicines, herbs, non-prescription drugs, or dietary supplements you use. Also tell them if you smoke, drink alcohol, or use illegal drugs. Some items may interact with your medicine. What should I watch for while using this medicine? Visit your healthcare professional for regular checks on your progress. Tell your healthcare professional if your symptoms do not start to get better or if they get worse. You should make sure you get enough vitamin B12 while you are taking this medicine. Discuss the foods you eat and the vitamins you take with your healthcare professional. This medicine may increase your risk to bruise or bleed. Call you healthcare professional if you notice any unusual bleeding. What side effects may I notice from receiving this medicine? Side effects that you should report to your doctor or health care professional as soon as possible:  allergic reactions like skin rash, itching or hives, swelling of the face, lips, or tongue  low blood counts - this medicine may decrease the number of white blood cells, red blood cells, and platelets. You may be at increased risk for infections and bleeding  pain, tingling, numbness in the hands or feet  severe diarrhea  signs and symptoms of infection like fever; chills; cough; sore throat; pain or trouble passing urine  signs and symptoms of muscle injury like dark urine; trouble passing urine or change in the amount of urine;  unusually weak or tired; muscle pain; back pain  unusual bleeding or bruising  unusually weak or tired  vomiting Side effects that usually do not require medical attention (report these to your doctor or health care professional if they continue or are bothersome):  mild diarrhea  nausea  stomach pain This list may not describe all possible side effects. Call your doctor for medical advice about side effects. You may report side effects to FDA at 1-800-FDA-1088. Where should I keep my medicine? Keep out of the reach of children. Store between 20 and 25 degrees C (68 and 77 degrees F). Throw away any unused medicine after the expiration date. NOTE: This sheet is a summary. It may not cover all possible information. If you have questions about this medicine, talk to your doctor, pharmacist, or health care provider.  2020 Elsevier/Gold Standard (2018-03-14 11:13:41) Gout  Gout is painful swelling of your joints. Gout is a type of arthritis. It is caused by having too much uric acid in your body. Uric acid is a chemical that is made when your body breaks down substances called purines. If your body has too much uric acid, sharp crystals can form and build up in your joints. This causes pain and swelling. Gout attacks can happen quickly and be very painful (acute gout). Over time, the attacks can affect more joints and happen more often (chronic gout). What are the causes?  Too much uric acid in your blood. This can happen because: ? Your kidneys do not remove enough uric acid from your blood. ? Your body makes too much uric acid. ? You eat too many foods that are high in purines. These foods include organ meats, some seafood, and beer.  Trauma or stress. What increases the risk?  Having a family history of gout.  Being male and middle-aged.  Being male and having gone through menopause.  Being very overweight (obese).  Drinking alcohol, especially beer.  Not having enough  water in the body (being dehydrated).  Losing weight too quickly.  Having an organ transplant.  Having lead poisoning.  Taking certain medicines.  Having kidney disease.  Having a skin condition called psoriasis. What are the signs or symptoms? An attack of acute gout usually happens in just one joint. The most common place is the big toe. Attacks often start at night. Other joints that may be affected include joints of the feet, ankle, knee, fingers, wrist, or elbow. Symptoms of an attack may include:  Very bad pain.  Warmth.  Swelling.  Stiffness.  Shiny, red, or purple skin.  Tenderness. The affected joint may be very painful to touch.  Chills and fever. Chronic gout may cause symptoms more often. More joints may be involved. You may also have white or yellow lumps (tophi) on your hands or feet or in other areas near your joints. How is this treated?  Treatment for this condition has two phases: treating an acute attack and preventing future attacks.  Acute gout treatment may include: ? NSAIDs. ? Steroids. These are taken by mouth or injected into a joint. ? Colchicine. This medicine relieves pain and swelling. It can be given by mouth or through an IV tube.  Preventive  treatment may include: ? Taking small doses of NSAIDs or colchicine daily. ? Using a medicine that reduces uric acid levels in your blood. ? Making changes to your diet. You may need to see a food expert (dietitian) about what to eat and drink to prevent gout. Follow these instructions at home: During a gout attack   If told, put ice on the painful area: ? Put ice in a plastic bag. ? Place a towel between your skin and the bag. ? Leave the ice on for 20 minutes, 2-3 times a day.  Raise (elevate) the painful joint above the level of your heart as often as you can.  Rest the joint as much as possible. If the joint is in your leg, you may be given crutches.  Follow instructions from your doctor  about what you cannot eat or drink. Avoiding future gout attacks  Eat a low-purine diet. Avoid foods and drinks such as: ? Liver. ? Kidney. ? Anchovies. ? Asparagus. ? Herring. ? Mushrooms. ? Mussels. ? Beer.  Stay at a healthy weight. If you want to lose weight, talk with your doctor. Do not lose weight too fast.  Start or continue an exercise plan as told by your doctor. Eating and drinking  Drink enough fluids to keep your pee (urine) pale yellow.  If you drink alcohol: ? Limit how much you use to:  0-1 drink a day for women.  0-2 drinks a day for men. ? Be aware of how much alcohol is in your drink. In the [place] one drink equals one 12 oz bottle of beer (355 mL), one 5 oz glass of wine (148 mL), or one 1 oz glass of hard liquor (44 mL). General instructions  Take over-the-counter and prescription medicines only as told by your doctor.  Do not drive or use heavy machinery while taking prescription pain medicine.  Return to your normal activities as told by your doctor. Ask your doctor what activities are safe for you.  Keep all follow-up visits as told by your doctor. This is important. Contact a doctor if:  You have another gout attack.  You still have symptoms of a gout attack after 10 days of treatment.  You have problems (side effects) because of your medicines.  You have chills or a fever.  You have burning pain when you pee (urinate).  You have pain in your lower back or belly. Get help right away if:  You have very bad pain.  Your pain cannot be controlled.  You cannot pee. Summary  Gout is painful swelling of the joints.  The most common site of pain is the big toe, but it can affect other joints.  Medicines and avoiding some foods can help to prevent and treat gout attacks. This information is not intended to replace advice given to you by your health care provider. Make sure you discuss any questions you have with your health care  provider. Document Revised: 11/17/2017 Document Reviewed: 11/17/2017 Elsevier Patient Education  [place].

## 2019-11-04 LAB — CBC WITH DIFFERENTIAL/PLATELET
Absolute Monocytes: 562 cells/uL (ref 200–950)
Basophils Absolute: 43 cells/uL (ref 0–200)
Basophils Relative: 0.6 %
Eosinophils Absolute: 151 cells/uL (ref 15–500)
Eosinophils Relative: 2.1 %
HCT: 42.5 % (ref 38.5–50.0)
Hemoglobin: 13.9 g/dL (ref 13.2–17.1)
Lymphs Abs: 1037 cells/uL (ref 850–3900)
MCH: 29.3 pg (ref 27.0–33.0)
MCHC: 32.7 g/dL (ref 32.0–36.0)
MCV: 89.7 fL (ref 80.0–100.0)
MPV: 11.6 fL (ref 7.5–12.5)
Monocytes Relative: 7.8 %
Neutro Abs: 5407 cells/uL (ref 1500–7800)
Neutrophils Relative %: 75.1 %
Platelets: 224 10*3/uL (ref 140–400)
RBC: 4.74 10*6/uL (ref 4.20–5.80)
RDW: 13.5 % (ref 11.0–15.0)
Total Lymphocyte: 14.4 %
WBC: 7.2 10*3/uL (ref 3.8–10.8)

## 2019-11-04 LAB — C-REACTIVE PROTEIN: CRP: 5.3 mg/L (ref <8.0)

## 2019-11-04 LAB — URIC ACID: Uric Acid, Serum: 4.5 mg/dL (ref 4.0–8.0)

## 2019-11-04 LAB — SEDIMENTATION RATE: Sed Rate: 11 mm/h (ref 0–20)

## 2019-11-05 ENCOUNTER — Encounter: Payer: Self-pay | Admitting: Podiatry

## 2019-11-05 NOTE — Progress Notes (Signed)
Subjective: 74 year old male presents the office today for concerns of left foot pain.  He states on Saturday he was doing well and had no issues.  On Sunday morning when he woke up he had swelling and redness of his foot not able to put weight on his foot.  Denies any recent injury or trauma and denies any change in activity level.  He has no history of gout.  He said no recent treatment but is very tender. Denies any systemic complaints such as fevers, chills, nausea, vomiting. No acute changes since last appointment, and no other complaints at this time.   Objective: AAO x3, NAD DP/PT pulses palpable bilaterally, CRT less than 3 seconds There is localized edema and erythema to the first MPJ mostly plantarly but also tenderness and swelling along the ball of the foot on the left side.  There is no areas of fluctuation crepitation peer there is no open lesions. No open lesions or pre-ulcerative lesions.  No pain with calf compression, swelling, warmth, erythema  Assessment: Left foot pain, possible gout  Plan: -All treatment options discussed with the patient including all alternatives, risks, complications.  -X-rays obtained and reviewed.  Not able to appreciate any evidence of acute fracture.  Awaiting radiology report. -We will order blood work including CBC, ESR, CRP, uric acid.  -We will start colchicine for gout.  Encouraged elevation.  Hydration. -Patient encouraged to call the office with any questions, concerns, change in symptoms.   Trula Slade DPM

## 2019-11-06 ENCOUNTER — Other Ambulatory Visit: Payer: Self-pay | Admitting: Podiatry

## 2019-11-06 MED ORDER — CEPHALEXIN 500 MG PO CAPS: 500.0000 mg | ORAL_CAPSULE | Freq: Three times a day (TID) | ORAL | 0 refills | Status: DC

## 2019-11-06 MED ORDER — INDOMETHACIN 50 MG PO CAPS
50.0000 mg | ORAL_CAPSULE | Freq: Two times a day (BID) | ORAL | 0 refills | Status: DC
Start: 2019-11-06 — End: 2020-03-05

## 2019-11-14 ENCOUNTER — Other Ambulatory Visit: Payer: Self-pay | Admitting: Podiatry

## 2019-11-14 DIAGNOSIS — R69 Illness, unspecified: Secondary | ICD-10-CM | POA: Diagnosis not present

## 2019-11-24 ENCOUNTER — Ambulatory Visit: Payer: Medicare HMO | Admitting: Podiatry

## 2019-11-24 ENCOUNTER — Other Ambulatory Visit: Payer: Self-pay

## 2019-11-24 ENCOUNTER — Encounter: Payer: Self-pay | Admitting: Podiatry

## 2019-11-24 VITALS — Temp 98.0°F

## 2019-11-24 DIAGNOSIS — M779 Enthesopathy, unspecified: Secondary | ICD-10-CM | POA: Diagnosis not present

## 2019-11-24 DIAGNOSIS — Q828 Other specified congenital malformations of skin: Secondary | ICD-10-CM

## 2019-11-24 DIAGNOSIS — E1149 Type 2 diabetes mellitus with other diabetic neurological complication: Secondary | ICD-10-CM

## 2019-11-26 NOTE — Progress Notes (Signed)
Subjective: 74 year old male presents the office today for further evaluation of left foot pain.  He states that he still having tenderness and difficulty putting pressure to the foot pointing more to submetatarsal 2.  The majority discomfort is to the bottom of the foot on the ball of foot.  No pain in the top of the foot.  No redness or warmth.  Also presents today to have his calluses trimmed as they are thick and causing discomfort.  Denies any systemic complaints such as fevers, chills, nausea, vomiting. No acute changes since last appointment, and no other complaints at this time.   Objective: AAO x3, NAD DP/PT pulses palpable bilaterally, CRT less than 3 seconds Majority tenderness is localized submetatarsal 2 on the second interspace of the left foot.  There is no pain the dorsal metatarsals no pain with MPJ range of motion.  There is no erythema associated the swelling there is no areas of fluctuation crepitation. Hyperkeratotic lesions bilateral submetatarsal 1 and 5 without underlying ulceration drainage or signs of infection No pain with calf compression, swelling, warmth, erythema  Assessment: Capsulitis left foot, hyperkeratotic lesions  Plan: -All treatment options discussed with the patient including all alternatives, risks, complications.  -Today steroid injection performed to the second interspace on second MPJ.  Skin was cleaned with Betadine, alcohol and mixture of 1 cc Kenalog 10, 0.5 cc of Marcaine plain, 0.5 cc of lidocaine plain was infiltrated into the area maximal tenderness without complications.  Postinjection care discussed.  Tolerated well.  Metatarsal offloading pads dispensed. -Reviewed blood work -Debrided calluses x4 without complications or bleeding  If no improvement in left foot pain in the next 1 to 2 weeks to let me know and we will repeat x-rays.  Trula Slade DPM

## 2019-11-28 ENCOUNTER — Other Ambulatory Visit: Payer: Self-pay | Admitting: Family Medicine

## 2019-11-28 DIAGNOSIS — K219 Gastro-esophageal reflux disease without esophagitis: Secondary | ICD-10-CM

## 2019-11-28 DIAGNOSIS — E119 Type 2 diabetes mellitus without complications: Secondary | ICD-10-CM

## 2019-11-29 DIAGNOSIS — G4733 Obstructive sleep apnea (adult) (pediatric): Secondary | ICD-10-CM | POA: Diagnosis not present

## 2019-12-05 ENCOUNTER — Other Ambulatory Visit: Payer: Self-pay | Admitting: Family Medicine

## 2019-12-05 DIAGNOSIS — R69 Illness, unspecified: Secondary | ICD-10-CM | POA: Diagnosis not present

## 2019-12-07 ENCOUNTER — Other Ambulatory Visit: Payer: Self-pay

## 2019-12-15 ENCOUNTER — Other Ambulatory Visit: Payer: Self-pay

## 2019-12-15 ENCOUNTER — Encounter: Payer: Self-pay | Admitting: Podiatry

## 2019-12-15 ENCOUNTER — Ambulatory Visit: Payer: Medicare HMO | Admitting: Podiatry

## 2019-12-15 ENCOUNTER — Ambulatory Visit (INDEPENDENT_AMBULATORY_CARE_PROVIDER_SITE_OTHER): Payer: Medicare HMO

## 2019-12-15 VITALS — Temp 98.2°F

## 2019-12-15 DIAGNOSIS — M7752 Other enthesopathy of left foot: Secondary | ICD-10-CM | POA: Diagnosis not present

## 2019-12-15 DIAGNOSIS — R2242 Localized swelling, mass and lump, left lower limb: Secondary | ICD-10-CM | POA: Diagnosis not present

## 2019-12-15 DIAGNOSIS — M79672 Pain in left foot: Secondary | ICD-10-CM | POA: Diagnosis not present

## 2019-12-15 DIAGNOSIS — M779 Enthesopathy, unspecified: Secondary | ICD-10-CM

## 2019-12-15 DIAGNOSIS — M84375D Stress fracture, left foot, subsequent encounter for fracture with routine healing: Secondary | ICD-10-CM

## 2019-12-15 NOTE — Progress Notes (Signed)
Subjective: 74 year old male presents the office for follow-up evaluation of left foot capsulitis, pain.  States that overall he is doing much better and his injection did help he points more along the sesamoids and on the first interspace plantarly where he had the majority discomfort.  Mild swelling but no recent injury or falls or changes otherwise.  He had no complications after the last steroid injection.  He has no other concerns today. Denies any systemic complaints such as fevers, chills, nausea, vomiting. No acute changes since last appointment, and no other complaints at this time.   Objective: AAO x3, NAD DP/PT pulses palpable bilaterally, CRT less than 3 seconds Tenderness mostly on the first interspace and along the sesamoids plantarly in the left foot.  There is mild edema but there is no erythema or warmth.  No area pinpoint tenderness.  No pain with MPJ range of motion. No pain with calf compression, swelling, warmth, erythema  Assessment: Tendinitis, capsulitis left foot  Plan: -All treatment options discussed with the patient including all alternatives, risks, complications.  -Repeat x-rays were obtained and reviewed I reviewed them with him.  I was not able to appreciate any evidence of acute fracture or stress fracture.  Awaiting radiology report -Steroid injection performed to the first interspace.  The skin was closed alcohol and mixture of 1 cc Kenalog 10, 0.5 cc of Marcaine plain, 0.5 cc lidocaine plain was infiltrated into the first interspace without complications.  Postinjection care discussed. -Dispensed metatarsal pads -Patient encouraged to call the office with any questions, concerns, change in symptoms.   Return in about 4 weeks (around 01/12/2020).  Trula Slade DPM

## 2019-12-23 ENCOUNTER — Other Ambulatory Visit: Payer: Self-pay | Admitting: Family Medicine

## 2020-01-01 DIAGNOSIS — R69 Illness, unspecified: Secondary | ICD-10-CM | POA: Diagnosis not present

## 2020-01-12 ENCOUNTER — Encounter: Payer: Self-pay | Admitting: Podiatry

## 2020-01-12 ENCOUNTER — Other Ambulatory Visit: Payer: Self-pay

## 2020-01-12 ENCOUNTER — Other Ambulatory Visit: Payer: Self-pay | Admitting: Family Medicine

## 2020-01-12 ENCOUNTER — Ambulatory Visit (INDEPENDENT_AMBULATORY_CARE_PROVIDER_SITE_OTHER): Payer: Medicare HMO | Admitting: Podiatry

## 2020-01-12 VITALS — Temp 97.3°F

## 2020-01-12 DIAGNOSIS — M79675 Pain in left toe(s): Secondary | ICD-10-CM | POA: Diagnosis not present

## 2020-01-12 DIAGNOSIS — M79674 Pain in right toe(s): Secondary | ICD-10-CM | POA: Diagnosis not present

## 2020-01-12 DIAGNOSIS — Q828 Other specified congenital malformations of skin: Secondary | ICD-10-CM | POA: Diagnosis not present

## 2020-01-12 DIAGNOSIS — E1149 Type 2 diabetes mellitus with other diabetic neurological complication: Secondary | ICD-10-CM

## 2020-01-12 DIAGNOSIS — B351 Tinea unguium: Secondary | ICD-10-CM | POA: Diagnosis not present

## 2020-01-19 NOTE — Progress Notes (Signed)
Subjective: 74 y.o. returns the office today for painful calluses to his feet that he will have trimmed as well as for elongated toenails are causing discomfort mostly with shoes.  Denies any redness or drainage of the toenail or callus sites.  Pain that he was extends on the foot had resolved the last injection did well.  Denies any open sores.  enies any systemic complaints such as fevers, chills, nausea, vomiting.   PCP: Shelda Pal, DO A1c: 7.2 on 01/06/2019  Objective: AAO 3, NAD DP/PT pulses palpable, CRT less than 3 seconds Hyperkeratotic lesions present bilateral submetatarsal one on file.  No underlying ulceration, drainage or any signs of infection. Nails are hypertrophic, dystrophic, brittle, discolored, elongated 10. No surrounding redness or drainage. Tenderness nails 1-5 bilaterally.  No pain otherwise the left foot in particular. No open lesions or pre-ulcerative lesions are identified. No pain with calf compression, swelling, warmth, erythema.  Assessment: Patient presents with symptomatic hyperkeratotic lesions; symptomatic onychomycosis;  type 2 diabetes with neuropathy  Plan: -Treatment options including alternatives, risks, complications were discussed -Hyperkeratotic lesion sharply debrided x4 without any complications or bleeding -Nails debrided A41 with any complications or bleeding -Discussed daily foot inspection. If there are any changes, to call the office immediately.  -Follow-up in 3 months or sooner if any problems are to arise. In the meantime, encouraged to call the office with any questions, concerns, changes symptoms.  Celesta Gentile, DPM

## 2020-01-24 ENCOUNTER — Other Ambulatory Visit: Payer: Self-pay | Admitting: Family Medicine

## 2020-01-26 DIAGNOSIS — R69 Illness, unspecified: Secondary | ICD-10-CM | POA: Diagnosis not present

## 2020-02-24 ENCOUNTER — Other Ambulatory Visit: Payer: Self-pay | Admitting: Family Medicine

## 2020-02-24 DIAGNOSIS — E119 Type 2 diabetes mellitus without complications: Secondary | ICD-10-CM

## 2020-02-24 DIAGNOSIS — K219 Gastro-esophageal reflux disease without esophagitis: Secondary | ICD-10-CM

## 2020-02-24 DIAGNOSIS — R69 Illness, unspecified: Secondary | ICD-10-CM | POA: Diagnosis not present

## 2020-03-04 ENCOUNTER — Telehealth: Payer: Self-pay | Admitting: Family Medicine

## 2020-03-04 NOTE — Telephone Encounter (Signed)
Patient's wife is requesting a call back in reference to patient and is current mental state.

## 2020-03-04 NOTE — Telephone Encounter (Signed)
Spoke to the patients wife. She states patient is scheduled with PCP tomorrow 03/05/2020 Memory is getting bad, forgetting where to turn when going somewhere, forgot how to use his screw driver, the wife states he is forgetting little things he never would have before.   She is very concerned.  She has mentioned his memory problems to him and then he forgets she ever mentioned it.  She wanted PCP to be aware since he is coming in to see him.

## 2020-03-04 NOTE — Telephone Encounter (Signed)
Called the patients wife left message to call back.

## 2020-03-05 ENCOUNTER — Encounter: Payer: Self-pay | Admitting: Family Medicine

## 2020-03-05 ENCOUNTER — Other Ambulatory Visit: Payer: Self-pay

## 2020-03-05 ENCOUNTER — Ambulatory Visit (INDEPENDENT_AMBULATORY_CARE_PROVIDER_SITE_OTHER): Payer: Medicare HMO | Admitting: Family Medicine

## 2020-03-05 VITALS — BP 132/80 | HR 94 | Temp 97.8°F | Ht >= 80 in | Wt 287.4 lb

## 2020-03-05 DIAGNOSIS — R413 Other amnesia: Secondary | ICD-10-CM

## 2020-03-05 DIAGNOSIS — E1169 Type 2 diabetes mellitus with other specified complication: Secondary | ICD-10-CM | POA: Diagnosis not present

## 2020-03-05 DIAGNOSIS — Z23 Encounter for immunization: Secondary | ICD-10-CM | POA: Diagnosis not present

## 2020-03-05 DIAGNOSIS — E669 Obesity, unspecified: Secondary | ICD-10-CM | POA: Diagnosis not present

## 2020-03-05 NOTE — Progress Notes (Signed)
Chief Complaint  Patient presents with  . Memory Loss    Subjective: Patient is a 74 y.o. male here for memory issues.  Pt's wife reports over the past 6 mo, his memory has been getting worse.  He has noticed a worsening over the past 2 to 3 weeks.  He is now retired.  He does not read or do anything mentally stimulating.  He recently forgot to put in a light fixture and it took him 2 hours when normally will take him 10 minutes.  He does not have any difficulty with names or recognizing people.  He is not getting into situations where he forgets where he was doing.  His mood is stable.  He is compliant with his CPAP.  He sleeps around 9 hours nightly and feels well rested.  Personality has not changed according to him or his wife.  Past Medical History:  Diagnosis Date  . Cancer [facility])    prostate  . Diabetes mellitus without complication ([place])   . Essential hypertension 03/06/2016  . GERD (gastroesophageal reflux disease)   . Hyperlipidemia   . Sleep apnea    sleeps with BPAP machine    Objective: BP 132/80 (BP Location: Left Arm, Patient Position: Sitting, Cuff Size: Normal)   Pulse 94   Temp 97.8 F (36.6 C) (Oral)   Ht 6\' 8"  (2.032 m)   Wt 287 lb 6 oz (130.4 kg)   SpO2 93%   BMI 31.57 kg/m  General: Awake, appears stated age Lungs: No accessory muscle use Psych: Age appropriate judgment and insight, normal affect and mood  Assessment and Plan: Memory difficulties - Plan: CBC, TSH, B12, Comprehensive metabolic panel, Ambulatory referral to Neurology  Need for influenza vaccination - Plan: Flu Vaccine QUAD High Dose(Fluad)  Diabetes mellitus type 2 in obese ([place]) - Plan: Hemoglobin A1c  Ck labs, refer to neuropsych. This could be because he is not challenging himself mentally. Counseled on diet/exercise. F/u in 6 mo for CPE.  The patient voiced understanding and agreement to the plan.  Cecil, DO 03/05/20  3:34 PM

## 2020-03-05 NOTE — Patient Instructions (Addendum)
Homework: Start reading [employer].   Keep the diet clean and stay active.  If you do not hear anything about your referral in the next 1-2 weeks, call our office and ask for an update.  Let us know if you need anything.

## 2020-03-06 ENCOUNTER — Encounter: Payer: Self-pay | Admitting: Neurology

## 2020-03-06 LAB — CBC
HCT: 44.9 % (ref 38.5–50.0)
Hemoglobin: 15 g/dL (ref 13.2–17.1)
MCH: 30.1 pg (ref 27.0–33.0)
MCHC: 33.4 g/dL (ref 32.0–36.0)
MCV: 90 fL (ref 80.0–100.0)
MPV: 11.1 fL (ref 7.5–12.5)
Platelets: 247 10*3/uL (ref 140–400)
RBC: 4.99 10*6/uL (ref 4.20–5.80)
RDW: 14.1 % (ref 11.0–15.0)
WBC: 7.4 10*3/uL (ref 3.8–10.8)

## 2020-03-06 LAB — COMPREHENSIVE METABOLIC PANEL
AG Ratio: 1.6 (calc) (ref 1.0–2.5)
ALT: 24 U/L (ref 9–46)
AST: 28 U/L (ref 10–35)
Albumin: 4.6 g/dL (ref 3.6–5.1)
Alkaline phosphatase (APISO): 46 U/L (ref 35–144)
BUN/Creatinine Ratio: 16 (calc) (ref 6–22)
BUN: 25 mg/dL (ref 7–25)
CO2: 28 mmol/L (ref 20–32)
Calcium: 10.3 mg/dL (ref 8.6–10.3)
Chloride: 105 mmol/L (ref 98–110)
Creat: 1.52 mg/dL — ABNORMAL HIGH (ref 0.70–1.18)
Globulin: 2.9 g/dL (calc) (ref 1.9–3.7)
Glucose, Bld: 143 mg/dL — ABNORMAL HIGH (ref 65–99)
Potassium: 4.5 mmol/L (ref 3.5–5.3)
Sodium: 142 mmol/L (ref 135–146)
Total Bilirubin: 0.5 mg/dL (ref 0.2–1.2)
Total Protein: 7.5 g/dL (ref 6.1–8.1)

## 2020-03-06 LAB — HEMOGLOBIN A1C
Hgb A1c MFr Bld: 7.8 % of total Hgb — ABNORMAL HIGH (ref <5.7)
Mean Plasma Glucose: 177 (calc)
eAG (mmol/L): 9.8 (calc)

## 2020-03-06 LAB — VITAMIN B12: Vitamin B-12: 441 pg/mL (ref 200–1100)

## 2020-03-06 LAB — TSH: TSH: 0.93 mIU/L (ref 0.40–4.50)

## 2020-03-09 ENCOUNTER — Ambulatory Visit: Payer: Medicare HMO | Attending: Internal Medicine

## 2020-03-09 ENCOUNTER — Other Ambulatory Visit: Payer: Self-pay

## 2020-03-09 DIAGNOSIS — Z23 Encounter for immunization: Secondary | ICD-10-CM

## 2020-03-09 NOTE — Progress Notes (Signed)
° °  Covid-19 Vaccination Clinic  Name:  Ronald Barr    MRN: [id number] DOB: 02/07/1946  03/09/2020  [name] was observed post Covid-19 immunization for 15 minutes without incident. He was provided with Vaccine Information Sheet and instruction to access the V-Safe system.   [name] was instructed to call 911 with any severe reactions post vaccine:  Difficulty breathing   Swelling of face and throat   A fast heartbeat   A bad rash all over body   Dizziness and weakness

## 2020-03-13 DIAGNOSIS — Z85828 Personal history of other malignant neoplasm of skin: Secondary | ICD-10-CM | POA: Diagnosis not present

## 2020-03-13 DIAGNOSIS — D1801 Hemangioma of skin and subcutaneous tissue: Secondary | ICD-10-CM | POA: Diagnosis not present

## 2020-03-13 DIAGNOSIS — L821 Other seborrheic keratosis: Secondary | ICD-10-CM | POA: Diagnosis not present

## 2020-03-13 DIAGNOSIS — L570 Actinic keratosis: Secondary | ICD-10-CM | POA: Diagnosis not present

## 2020-03-13 DIAGNOSIS — L814 Other melanin hyperpigmentation: Secondary | ICD-10-CM | POA: Diagnosis not present

## 2020-03-15 ENCOUNTER — Other Ambulatory Visit: Payer: Self-pay | Admitting: Family Medicine

## 2020-03-15 ENCOUNTER — Ambulatory Visit: Payer: Medicare HMO | Admitting: Podiatry

## 2020-03-15 DIAGNOSIS — R69 Illness, unspecified: Secondary | ICD-10-CM | POA: Diagnosis not present

## 2020-03-21 ENCOUNTER — Ambulatory Visit: Payer: Medicare HMO | Admitting: Neurology

## 2020-03-22 ENCOUNTER — Other Ambulatory Visit: Payer: Self-pay | Admitting: Family Medicine

## 2020-03-22 DIAGNOSIS — IMO0002 Reserved for concepts with insufficient information to code with codable children: Secondary | ICD-10-CM

## 2020-03-22 DIAGNOSIS — E1165 Type 2 diabetes mellitus with hyperglycemia: Secondary | ICD-10-CM

## 2020-03-27 ENCOUNTER — Encounter: Payer: Self-pay | Admitting: Counselor

## 2020-03-27 ENCOUNTER — Ambulatory Visit: Payer: Medicare HMO | Admitting: Psychology

## 2020-03-27 ENCOUNTER — Ambulatory Visit (INDEPENDENT_AMBULATORY_CARE_PROVIDER_SITE_OTHER): Payer: Medicare HMO | Admitting: Counselor

## 2020-03-27 ENCOUNTER — Other Ambulatory Visit: Payer: Self-pay

## 2020-03-27 DIAGNOSIS — F09 Unspecified mental disorder due to known physiological condition: Secondary | ICD-10-CM

## 2020-03-27 DIAGNOSIS — R413 Other amnesia: Secondary | ICD-10-CM

## 2020-03-27 DIAGNOSIS — R69 Illness, unspecified: Secondary | ICD-10-CM | POA: Diagnosis not present

## 2020-03-27 NOTE — Progress Notes (Signed)
   Psychometrist Note   Cognitive testing was administered to Ronald Barr by Milana Kidney, B.S. (Technician) under the supervision of Alphonzo Severance, Psy.D., ABN. Ronald Barr was able to tolerate all test procedures. Dr. Nicole Kindred met with the patient as needed to manage any emotional reactions to the testing procedures. Rest breaks were offered.    The battery of tests administered was selected by Dr. Nicole Kindred with consideration to the patient's current level of functioning, the nature of his symptoms, emotional and behavioral responses during the interview, level of literacy, observed level of motivation/effort, and the nature of the referral question. This battery was communicated to the psychometrist. Communication between Dr. Nicole Kindred and the psychometrist was ongoing throughout the evaluation and Dr. Nicole Kindred was immediately accessible at all times. Dr. Nicole Kindred provided supervision to the technician on the date of this service, to the extent necessary to assure the quality of all services provided.    Ronald Barr will return in approximately one week for an interactive feedback session with Dr. Nicole Kindred, at which time test performance, clinical impressions, and treatment recommendations will be reviewed in detail. The patient understands he can contact our office should he require our assistance before this time.   A total of 105 minutes of billable time were spent with Ronald Barr by the technician, including test administration and scoring time. Billing for these services is reflected in Dr. Les Pou note.   This note reflects time spent with the psychometrician and does not include test scores, clinical history, or any interpretations made by Dr. Nicole Kindred. The full report will follow in a separate note.

## 2020-03-27 NOTE — Progress Notes (Signed)
[facility]  Patient Name: Ronald Barr MRN: [id number] Date of Birth: [birth year] Age: 74 y.o. Education: 18 years  Referral Circumstances and Background Information  [name] is a 74 y.o., right-hand dominant, married man with a history of HTN, HLD, DM2, OSA (on BiPap) and memory loss over perhaps the past 6 months with some "progression" in October as per the patient's wife. He is a patient of Dr. Irene Limbo and was referred for neuropsychological evaluation in the service of diagnostic clarification. Dr. Irene Limbo notes mention a history of difficulties doing home repair tasks and also that the patient does not do anything mentally stimulating.    On interview, the patient reported that he has been noticing memory problems over the past 6 months and his wife Ronald Barr) agrees. He isn't particularly concerned although his wife is, but he is aware of them. 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 He denied that he was having any problems on the job. The patient himself had no other spontaneous complaints other than difficulties visualizing where he is going when he goes places in the car. His wife stated that he tends to drive past turns and in general he seems to be more confused about where things are. Nevertheless, he is not getting lost. His wife described him as "the smartest man I have ever met" and it is "really scary" for her to think that she can't depend on him. They are reporting minor memory loss, more for details of things, notfor  entire events and there is no rapid forgetting of information. He has no changes in judgment and problem solving, and he has no problems with orientation. They think that retiring was difficult for him and the patient reported that he feels "extremely bored" since he retired, as though there is no challenge in his  life. He doesn't feel sad, although he says that he has no motivation and is not interested in things and has some apathy. His wife said he is sleeping more than in the past. He is sleeping about 9 hours a night but he sometimes sleeps more in the recliner. He is watching a lot of TV and has no hobbies. He used to play golf but had been out of it for so long it was too challenging and he sold his clubs. He is not acting out dreams. His energy is adequate. He is not gaining or losing weight.   With respect to functioning, Ronald Barr is still managing finances and reported that he has no issues, he pays the bills on time and balances the checkbook. He is still driving and has no accidents or safety issues, there was one occasion 2 or 3 weeks ago where he was going to his dermatologists office (has been going there many years) and he had to call his wife because he couldn't remember. He has no difficulties using electronics such as his smart phone or his computer. He is normal in his functioning around the house, he cooks and doesn't have any problems with that. He is still functioning at his normal level in the community, he can go grocery shopping, there are no real changes. They have minimal social engagement, his wife's children live far away, he has a daughter Ronald Barr who he sees at times and helps with her house. Their grandchildren don't visit them very often. They have a great granddaughter  who is 3 and they get to see her.   Past Medical History and Review of Relevant Studies   Patient Active Problem List   Diagnosis Date Noted  . OSA treated with BiPAP 02/24/2018  . Medial epicondylitis of elbow, left 09/20/2017  . Diabetes mellitus type 2 in obese ([place]) 09/16/2017  . Hyperlipidemia 03/11/2017  . Dupuytrens contracture 01/07/2017  . Trigger thumb, right thumb 12/17/2016  . Essential hypertension 03/06/2016  . Primary osteoarthritis of left knee 07/10/2014  . Trochanteric bursitis of left  hip 07/10/2014  . Chronic venous insufficiency 05/31/2014    Review of Neuroimaging and Relevant Medical History: No mental status testing in chart.   The patient has no neuroimaging that I was able to find in the chart.   The patient denied any history of seizures, strokes, CNS infections, neurosurgery, or significant head injuries.   Current Outpatient Medications  Medication Sig Dispense Refill  . glipiZIDE (GLUCOTROL XL) 10 MG 24 hr tablet Take 2 tablets (20 mg total) by mouth daily. 180 tablet 1  . amLODipine (NORVASC) 10 MG tablet TAKE ONE TABLET BY MOUTH DAILY 90 tablet 0  . cyclobenzaprine (FLEXERIL) 10 MG tablet TAKE 1 TABLET BY MOUTH AT BEDTIME 90 tablet 2  . fenofibrate (TRICOR) 145 MG tablet TAKE 1 TABLET BY MOUTH DAILY 90 tablet 0  . glucose blood (ONETOUCH VERIO) test strip Use twice daily to check blood sugar.  DXE11.9 100 each 12  . IRON PO Take 1 tablet by mouth daily.    . Lancets (ONETOUCH DELICA PLUS OTLXBW62M) MISC CHECK BLOOD SUGAR TWO TIMES A DAY 100 each 5  . MAGNESIUM PO Take 1 tablet by mouth 2 (two) times daily at 10 AM and 5 PM.    . metFORMIN (GLUCOPHAGE) 850 MG tablet TAKE 1 TABLET BY MOUTH 3 TIMES A DAY WITH A MEAL 270 tablet 0  . Multiple Vitamins-Minerals (MULTIVITAMIN PO) Take 1 tablet by mouth daily.    Marland Kitchen NOVOFINE PEN NEEDLE 32G X 6 MM MISC USE WITH TRESIBA TWO TIMES A DAY 100 each 0  . omeprazole (PRILOSEC) 20 MG capsule TAKE ONE CAPSULE BY MOUTH DAILY 90 capsule 0  . pioglitazone (ACTOS) 30 MG tablet TAKE ONE TABLET BY MOUTH DAILY 90 tablet 0  . pravastatin (PRAVACHOL) 10 MG tablet TAKE ONE TABLET BY MOUTH DAILY 90 tablet 0  . TRESIBA FLEXTOUCH 100 UNIT/ML FlexTouch Pen INJECT 30 UNITS AT BEDTIME AND 10 UNITS IN THE MORNING 3 mL 3   No current facility-administered medications for this visit.    Family History  Problem Relation Age of Onset  . Hypertension Mother   . Hypertension Father    There is no  family history of dementia. In fact, many  of his family members have lived into their 52s. He had two brothers, one passed in his 56s of pancreatic cancer, the other fractured a hip, got pneumonia, and never recovered (was in his mid 36s). There is no  family history of psychiatric illness.  Psychosocial History  Developmental, Educational and Employment History: The patient grew up in [place]. He did very well in school and denied ever being held back or having any problems. 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 He has worked in a number of business capacities, he worked for [employer] early in his career and has been a controller for multiple factories, doing Press photographer and the like. He was  employed in that capacity until March, 2021 when he retired.   Psychiatric History: None  Substance Use History: The patient doesn't consume alcohol regularly, he doesn't smoke or use drugs.   Relationship History and Living Cimcumstances: The patient and his wife have been married for nearly 30 years. They dated in high school, went their separate ways, and then married later in life. He has children from a previous marriage, a son and a daughter. He sees his daughter on occasion.   Mental Status and Behavioral Observations  Sensorium/Arousal: The patient's level of arousal was awake and alert. Hearing and vision were adequate with correction (glasses) for testing purposes. Orientation: The patient was alert and fully oriented to person, place, time, and situation. 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  Appearance: Dressed in appropriate casual clothing Behavior: Pleasant, appropriate Speech/language: Normal in rate, rhythm, volume, and prosody without word finding problems or paraphasia Gait/Posture: Appeared normal, narrow based, without any ataxia Movement: No overt signs or symptoms of movement  disorder Social Comportment: Pleasant and appropriate within social norms Mood: "Blah" Affect: Mainly neutral Thought process/content: Thought process was logical, linear, and goal-directed. Thought content was appropriate to the topics discussed. He presented as a good historian and had no difficulty establishing a personal timeline.  Safety: No thoughts of harming self or others.  Insight: Fair, patient is aware of his subtle changes  Montreal Cognitive Assessment  03/27/2020  Visuospatial/ Executive (0/5) 5  Naming (0/3) 3  Attention: Read list of digits (0/2) 2  Attention: Read list of letters (0/1) 1  Attention: Serial 7 subtraction starting at 100 (0/3) 3  Language: Repeat phrase (0/2) 1  Language : Fluency (0/1) 1  Abstraction (0/2) 2  Delayed Recall (0/5) 0  Orientation (0/6) 6  Total 24  Adjusted Score (based on education) 24   Test Procedures  Wide Range Achievement Test - 4             Word Reading Doy Mince' Intellectual Screening Test Neuropsychological Assessment Battery  Memory Module  Naming  Digit Span Repeatable Battery for the Assessment of Neuropsychological Status (Form A)  Figure Copy  Judgment of Line Orientation  Coding  Figure Recall The Dot Counting Test A Random Letter Test Controlled Oral Word Association (F-A-S) Semantic Fluency (Animals) Trail Making Test A & B Complex Ideational Material Modified [place] Card Sorting Test Geriatric Depression Scale - Short Form Quick Dementia Rating System (completed by wife, Ronald Barr)  Plan  Ronald Barr was seen for a psychiatric diagnostic evaluation and neuropsychological testing. He is a 74'year'old, right-hand dominant man with a history of memory and thinking problems noticed only over the past 7 months or so. 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 He denies a subjective sense of depressed mood but does report that he feels like he doesn't  have much to get up for in the morning, he has no challenges and is watching a lot of TV. He is screening in the normal to MCI range considering his education with 0/5 on delayed recall with the MoCA. If he does have a problem, it is quite subtle at this point because he is still functioning well in all areas. Full and complete note with impressions, recommendations, and interpretation of test data to follow.   Viviano Simas Nicole Kindred, PsyD, [place] Clinical Neuropsychologist  Informed Consent and Coding/Compliance  Risks and benefits of  the evaluation were discussed with the patient prior to all testing procedures. I conducted a clinical interview and neuropsychological testing (at least two tests) with Su Ley and Milana Kidney, B.S. (Technician) administered additional test procedures. The patient was able to tolerate the testing procedures and the patient (and/or family if applicable) is likely to benefit from further follow up to receive the diagnosis and treatment recommendations, which will be rendered at the next encounter. Billing below reflects technician time, my direct face-to-face time with the patient, time spent in test administration, and time spent in professional activities including but not limited to: neuropsychological test interpretation, integration of neuropsychological test data with clinical history, report preparation, treatment planning, care coordination, and review of diagnostically pertinent medical history or studies.   Services associated with this encounter: Clinical Interview [phone]) plus 60 minutes (97953; Neuropsychological Evaluation by Professional)  120 minutes (69223; Neuropsychological Evaluation by Professional, Adl.) 16 minutes (00979; Test Administration by Professional) 30 minutes (49971; Neuropsychological Testing by Technician) 75 minutes (82099; Neuropsychological Testing by Technician, Adl.)

## 2020-03-28 NOTE — Progress Notes (Signed)
[facility]  Patient Name: Ronald Barr MRN: [id number] Date of Birth: [birth year] Age: 74 y.o. Education: 18 years  Measurement properties of test scores: IQ, Index, and Standard Scores (SS): Mean = 100; Standard Deviation = 15 Scaled Scores (Ss): Mean = 10; Standard Deviation = 3 Z scores (Z): Mean = 0; Standard Deviation = 1 T scores (T); Mean = 50; Standard Deviation = 10  TEST SCORES:    Note: This summary of test scores accompanies the interpretive report and should not be interpreted by unqualified individuals or in isolation without reference to the report. Test scores are relative to age, gender, and educational history as available and appropriate.   Performance Validity        "A" Random Letter Test Raw  Descriptor      Errors 0 Within Expectation  The Dot Counting Test: 8 Within Expectation      Mental Status Screening     Total Score Descriptor  MoCA 24 Normal      Expected Functioning        Wide Range Achievement Test: Standard/Scaled Score Percentile      Word Reading 107 68      Reynolds Intellectual Screening Test Standard/T-score Percentile      Guess What 52 18      Odd Item Out 53 62  RIST Index 105 63      Attention/Processing Speed        Neuropsychological Assessment Battery (Attention Module, Form 1): Scaled/T-score Percentile     Digits Forward 72 99     Digits Backwards 58 79      Repeatable Battery for the Assessment of Neuropsychological Status (Form A): Standard Score Percentile     Coding 16 98      Language        Neuropsychological Assessment Battery (Language Module, Form 1): T-score Percentile      Naming   (31) 57 75      Verbal Fluency:  T Score Percentile      Controlled Oral Word Association (F-A-S) 43 25      Semantic Fluency (Animals) 53 62      Memory:        Neuropsychological Assessment Battery (Memory Module, Form 1): T-score/Standard Score Percentile  Memory Index (MEM):  63 1      List Learning           List A Immediate Recall   (4 , 6 , 7) 38 12         List B Immediate Recall   (4) 48 42         List A Short Delayed Recall   (0) 19 <1         List A Long Delayed Recall   (0) 23 <1         List A Percent Retention   (0 %) --- <1         List A Long Delayed Yes/No Recognition Hits   (12) --- 88         List A Long Delayed Yes/No Recognition False Alarms   (7) --- 21         List A Recognition Discriminability Index --- 31      Shape Learning           Immediate Recognition   (1 , 4 , 6) 38 12         Delayed Recognition   (3) 33 5  Percent Retention   (50 %) --- 8         Delayed Forced-Choice Recognition Hits   (7) --- 25         Delayed Forced-Choice Recognition False Alarms   (6) --- <1         Delayed Forced-Choice Recognition Discriminability --- <1     Story Learning           Immediate Recall   (14, 21) 30 2         Delayed Recall   (13) 31 3         Percent Retention   (62 %) --- 9      Daily Living Memory            Immediate Recall   (23, 16) 43 25          Delayed Recall   (4, 1) 19 <1          Percent Retention (31 %) --- <1          Recognition Hits   (7) --- 8      Repeatable Battery for the Assessment of Neuropsychological Status (Form A): Scaled Score Percentile         Figure Recall   (4) 4 2      Visuospatial/Constructional Functioning        Repeatable Battery for the Assessment of Neuropsychological Status (Form A): Standard/Scaled Score Percentile      Visuospatial/Constructional Index 131 98         Figure Copy   (20) 14 91         Judgment of Line Orientation   (20) --- >75      Executive Functioning        Modified [employer] Test (MWCST): Standard/T-Score Percentile      Number of Categories Correct 42 21      Number of Perseverative Errors 50 50      Number of Total Errors 45 31      Percent Perseverative Errors 54 66  Executive Function Composite 93 32      Trail Making Test: T-Score  Percentile      Part A 55 69      Part B 53 62      Boston Diagnostic Aphasia Exam: Raw Score Scaled Score      Complex Ideational Material 11 9      Clock Drawing Raw Score Descriptor      Command 10 WNL      Rating Scales        Clinical Dementia Rating Raw Score Descriptor      Sum of Boxes 1.5 Mild Cognitive Impairment      Global Score 0.5 MCI      Quick Dementia Rating System Raw Score Descriptor      Sum of Boxes 1 MCI      Total Score 2.5 MCI  Geriatric Depression Scale - Short Form 2 Negative   Adisson Deak V. Nicole Kindred PsyD, [place] Clinical Neuropsychologist

## 2020-03-29 NOTE — Progress Notes (Signed)
[facility]  Patient Name: Ronald Barr MRN: [id number] Date of Birth: [birth year] Age: 74 y.o. Education: 71 years  Clinical Impressions  Ronald Barr is a 74 y.o., right-hand dominant, married man with a history of HTN, HLD, DM2, OSA (on BiPAP) and memory and thinking problems noticed over the past 6 months by him and his wife. He acknowledges and has noticed the changes but isn't particularly concerned. The only spontaneous complaint they had was that he has greater geographic disorientation when driving but he is not getting lost and there are no safety concerns. He was working at a demanding job until March, 2021, although he stated that it was highly over learned for him and was not particularly challenging because he had been doing it for so long. He is still functioning well, including managing the finances and doing all the things he usually did, although he does not do anything cognitively challenging. He reported feeling a bit purposeless since retiring.   Neuropsychological testing shows average overall cognitive abilities and memory test scores falling well below that with an extremely low score on the overall index. Although he did retain some information across time and is cuing up on measures of delayed recognition, the profile is convincing for a developing storage problem. He did well in all other areas including demonstrating errorless digit repetition forward and high average digit repetition backward. He screened negative for the presence of depression, his wife rated him as functioning at an MCI level at most and his CDR in conjunction with his clinical history places him at an MCI level problem.   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 He will benefit from implementing  lifestyle changes such as MIND DASH diet, exercising more, and also developing more stimulating past times. A trial of antidementia medication could be considered, as deemed medically appropriate by Dr. Nani Ravens. I will also counsel him regarding the link between metabolic and cognitive health, because uncontrolled diabetes is a risk factor for dementia. No further workup is necessary at this time, although MRI of the brain could be considered to make sure nothing is being missed and for further diagnostic clarity. This may also serve as a useful baseline if there is any decline in the future.   Diagnostic Impressions: Amnestic mild cognitive impairment  Test Findings  Test scores are summarized in additional documentation associated with this encounter. Test scores are relative to age, gender, and educational history as available and appropriate. There were no concerns about performance validity as all findings fell within normal expectations.   General Intellectual Functioning/Achievement:  Performance on single word reading was toward the high end of the average range with a comparable score on the RIST index and comparable average range performance on its constituent visual and verbal subtests. Average thus presents as a reasonable standard of comparison for Ronald Barr cognitive test performance.   Attention and Processing Efficiency: Indicators of attention and processing efficiency generated very good scores, with a superior score on digit repetition forward and a high average score on digit repetition backward. With respect to processing speed, timed number symbol coding was very good and fell at a superior level. Simple numeric sequencing was average.   Language: Performance was within normal limits on language measures, with a low average score on generation of words in response to the letters  F-A-S and an average score in response to the category prompt "animals." Visual object  confrontation naming was errorless.  Visuospatial Function: Performance on indicators of visuospatial and constructional functioning was very strong with errorless scores on constructional and perceptual tasks and a superior score on the overall index.   Learning and Memory: Measures of learning and memory were well below expectations for Ronald Barr with an extremely low score on the overall index. The pattern is concerning for a storage problem.   In the verbal realm, Ronald Barr learned 4, 6, and 7 words of a 12-item word list across three learning trials, which is reasonable low average performance. He did not recall any of these words, however, on short or long delayed recall trials, generating extremely low scores. He did derive benefit from recognition cuing with an average score for recognition discriminability. Memory for a short story was low average on immediate recall and unusually low on delayed recall with unusually low retention of information across time. His memory for daily-living type information was low average on immediate recall and extremely low on delayed recall. Recognition in this case was unusually low.   In the visual realm, Ronald Barr achieved a low average score when learning a series of designs that are difficult to verbally encode. His retention across time, however, was poor resulting in an unusually low score on delayed recognition. He did not derive benefit from a yes/no recognition formal and demonstrated unusually low performance on that metric. Delayed free recall for a modestly complex figure stimulus was extremely low.   Executive Functions: Performance on executive indicators was within normal limits with an average score on the [place] Composite of the [employer]. Alternating sequencing of numbers and letters was average. Generation of words in response to given letters was low average. He performed at an average level on clock  drawing and when reasoning with verbal information on the Complex Ideational Material. Clock drawing was normal with good face formation, numbering, and hand placement.   Rating Scale(s): Ronald Barr screened negative for the presence of depression. His wife characterized him as functioning at an MCI level at most. I was able to rate a CDR for him and his global score is 0.5, his Sum of Boxes score is 1.5, which is a mild cognitive impairment level problem.   Viviano Simas Nicole Kindred PsyD, [place] Clinical Neuropsychologist

## 2020-04-01 ENCOUNTER — Other Ambulatory Visit: Payer: Self-pay

## 2020-04-01 ENCOUNTER — Ambulatory Visit (INDEPENDENT_AMBULATORY_CARE_PROVIDER_SITE_OTHER): Payer: Medicare HMO | Admitting: Family Medicine

## 2020-04-01 ENCOUNTER — Encounter: Payer: Self-pay | Admitting: Family Medicine

## 2020-04-01 VITALS — BP 140/78 | HR 88 | Temp 97.9°F | Ht >= 80 in | Wt 289.0 lb

## 2020-04-01 DIAGNOSIS — G3184 Mild cognitive impairment, so stated: Secondary | ICD-10-CM

## 2020-04-01 MED ORDER — DONEPEZIL HCL 5 MG PO TABS: 5.0000 mg | ORAL_TABLET | Freq: Every day | ORAL | 3 refills | Status: DC

## 2020-04-01 NOTE — Patient Instructions (Signed)
Send me a message in 1 mo regarding how you are doing the medicine. If doing well, we can go up on the dosage.  Keep the diet clean and stay active.  Try to do puzzles and other mentally challenging things (like reading).  Let us know if you need anything.

## 2020-04-01 NOTE — Progress Notes (Signed)
Chief Complaint  Patient presents with  . Results    discuss options    Subjective: Patient is a 74 y.o. male here for f/u.  He is here with his wife.  Patient saw the neuropsychology team and was diagnosed with mild cognitive impairment with concerns for early symptoms of Alzheimer's dementia.  He is not currently on any medication.  He does not do anything that is mentally stimulating.  He does not exercise due to bilateral hip pain that is chronic.  His diabetes is controlled for his age.  Past Medical History:  Diagnosis Date  . Cancer [facility])    prostate  . Diabetes mellitus without complication ([place])   . Essential hypertension 03/06/2016  . GERD (gastroesophageal reflux disease)   . Hyperlipidemia   . Sleep apnea    sleeps with BPAP machine    Objective: BP 140/78 (BP Location: Left Arm, Patient Position: Sitting, Cuff Size: Normal)   Pulse 88   Temp 97.9 F (36.6 C) (Oral)   Ht 6\' 8"  (2.032 m)   Wt 289 lb (131.1 kg)   SpO2 95%   BMI 31.75 kg/m  General: Awake, appears stated age Lungs: No accessory muscle use Psych:  normal affect and mood  Assessment and Plan: MCI (mild cognitive impairment) - Plan: donepezil (ARICEPT) 5 MG tablet  We will start a low-dose of Aricept at 5 mg daily.  Counseled on side effects and expectations.  This is not to reverse any cognition but to prevent worsening.  He will send me a MyChart message in 1 month and let me know how he is doing.  If he is doing well, we will increase to 10 mg daily and follow-up as originally scheduled.  He was encouraged to do mentally stimulating activities including reading and puzzles. The patient and his wife voiced understanding and agreement to the plan.  [place], DO 04/01/20  3:58 PM

## 2020-04-03 ENCOUNTER — Encounter: Payer: Self-pay | Admitting: Family Medicine

## 2020-04-03 DIAGNOSIS — E119 Type 2 diabetes mellitus without complications: Secondary | ICD-10-CM | POA: Diagnosis not present

## 2020-04-03 DIAGNOSIS — Z0100 Encounter for examination of eyes and vision without abnormal findings: Secondary | ICD-10-CM | POA: Diagnosis not present

## 2020-04-03 LAB — HM DIABETES EYE EXAM

## 2020-04-09 ENCOUNTER — Encounter: Payer: Medicare HMO | Admitting: Counselor

## 2020-04-11 ENCOUNTER — Other Ambulatory Visit: Payer: Self-pay | Admitting: Family Medicine

## 2020-04-11 DIAGNOSIS — E119 Type 2 diabetes mellitus without complications: Secondary | ICD-10-CM

## 2020-04-11 DIAGNOSIS — R69 Illness, unspecified: Secondary | ICD-10-CM | POA: Diagnosis not present

## 2020-04-19 ENCOUNTER — Telehealth: Payer: Self-pay | Admitting: Counselor

## 2020-04-19 NOTE — Telephone Encounter (Signed)
I called the patient on the date and time of this message and asked if I could speak with his wife. He provided his verbal assent and sounded very reasonable. I called her at [phone]. She was upset because he wanted to wait to speak with me prior to initiating the medication Dr. Nani Ravens has prescribed and also has googled the diagnosis and is interested in an MRI because the diagnosis could be wrong and he thinks that may be helpful. While reinforcing the importance of taking all his medication as prescribed, I explained to her that the medication is not disease modifying and no harm will befall him if he does not initiate it. I explained it is not disease modifying. His wife thanked me for the call and we will discuss the situation together on Friday, 04/26/2020.

## 2020-04-19 NOTE — Telephone Encounter (Signed)
Patient's wife called concerned with her husband. He is refusing to take Aricept unless an MRI is done that proves that something is wrong. She is wondering if this is something that Dr Nicole Kindred could order for him, if Dr Nicole Kindred feels like that would be helpful? She also wanted to just make him aware that this is going on before the follow up that's scheduled. Please call.

## 2020-04-26 ENCOUNTER — Encounter: Payer: Self-pay | Admitting: Counselor

## 2020-04-26 ENCOUNTER — Other Ambulatory Visit: Payer: Self-pay

## 2020-04-26 ENCOUNTER — Ambulatory Visit (INDEPENDENT_AMBULATORY_CARE_PROVIDER_SITE_OTHER): Payer: Medicare HMO | Admitting: Counselor

## 2020-04-26 DIAGNOSIS — G3184 Mild cognitive impairment, so stated: Secondary | ICD-10-CM | POA: Diagnosis not present

## 2020-04-26 NOTE — Progress Notes (Signed)
[facility]  Feedback Note: I met with Ronald Barr and his wife Ronald Barr to review the findings resulting from his neuropsychological evaluation. Since the last appointment, he has been well. He wanted to wait to take the medication until this appointment. I reviewed with him his diagnosis and treatment plan and suggested that he follow Dr. Irene Limbo recommendation regarding Aricept. I clarified with him that this is not a curative or disease modifying therapy, but it can be symptomatically helpful. We also reviewed dietary and lifestyle changes, which may in fact be disease modifying to some extent and have been shown to potentially alter rate of decline. Time was spent reviewing the impressions and recommendations that are detailed in the evaluation report. Other interventions as reflected in the patient instructions. I took time to explain the findings and answer all the patient's questions. I encouraged Ronald Barr to contact me should he have any further questions or if further follow up is desired.   Current Medications and Medical History   Current Outpatient Medications  Medication Sig Dispense Refill  . glipiZIDE (GLUCOTROL XL) 10 MG 24 hr tablet Take 2 tablets (20 mg total) by mouth daily. 180 tablet 1  . amLODipine (NORVASC) 10 MG tablet TAKE ONE TABLET BY MOUTH DAILY 90 tablet 0  . cyclobenzaprine (FLEXERIL) 10 MG tablet TAKE 1 TABLET BY MOUTH AT BEDTIME 90 tablet 2  . donepezil (ARICEPT) 5 MG tablet Take 1 tablet (5 mg total) by mouth at bedtime. 30 tablet 3  . fenofibrate (TRICOR) 145 MG tablet TAKE 1 TABLET BY MOUTH DAILY 90 tablet 0  . glucose blood (ONETOUCH VERIO) test strip Use twice daily to check blood sugar.  DXE11.9 100 each 12  . IRON PO Take 1 tablet by mouth daily.    . Lancets (ONETOUCH DELICA PLUS [id number]) MISC CHECK BLOOD SUGAR TWO TIMES A DAY 100 each 5  . MAGNESIUM PO Take 1 tablet by mouth 2 (two) times daily at 10 AM and  5 PM.    . metFORMIN (GLUCOPHAGE) 850 MG tablet TAKE 1 TABLET BY MOUTH 3 TIMES A DAY WITH A MEAL 270 tablet 0  . Multiple Vitamins-Minerals (MULTIVITAMIN PO) Take 1 tablet by mouth daily.    Marland Kitchen NOVOFINE PEN NEEDLE 32G X 6 MM MISC USE WITH TRESIBA TWO TIMES A DAY 100 each 0  . omeprazole (PRILOSEC) 20 MG capsule TAKE ONE CAPSULE BY MOUTH DAILY 90 capsule 0  . pioglitazone (ACTOS) 30 MG tablet TAKE ONE TABLET BY MOUTH DAILY 90 tablet 0  . pravastatin (PRAVACHOL) 10 MG tablet TAKE ONE TABLET BY MOUTH DAILY 90 tablet 0  . TRESIBA FLEXTOUCH 100 UNIT/ML FlexTouch Pen INJECT 30 UNITS AT BEDTIME AND 10 UNITS IN THE MORNING 3 mL 3   No current facility-administered medications for this visit.    Patient Active Problem List   Diagnosis Date Noted  . Mild cognitive impairment 04/26/2020  . OSA treated with BiPAP 02/24/2018  . Medial epicondylitis of elbow, left 09/20/2017  . Diabetes mellitus type 2 in obese ([place]) 09/16/2017  . Hyperlipidemia 03/11/2017  . Dupuytrens contracture 01/07/2017  . Trigger thumb, right thumb 12/17/2016  . Essential hypertension 03/06/2016  . Primary osteoarthritis of left knee 07/10/2014  . Trochanteric bursitis of left hip 07/10/2014  . Chronic venous insufficiency 05/31/2014    Mental Status and Behavioral Observations  Ronald Barr presented on time to the present encounter and was alert and generally oriented. Speech was normal in rate,  rhythm, volume, and prosody. Self-reported mood was "good" and affect was euthymic. Thought process was logical and goal oriented and thought content was appropriate. There were no safety concerns identified at today's encounter, such as thoughts of harming self or others.   Plan  Feedback provided regarding the patient's neuropsychological evaluation. We reviewed his diagnosis, recommendation for lifestyle changes, and he is considering a trial of Aricept under Dr. Irene Limbo direction. His wife clearly has an emotional reaction  to his diagnosis given her experiences with her parents and I spent time reviewing the differences between very early stage amnestic MCI and dementia. Also referred her to Alzheimer's Association for support groups. Ronald Barr was encouraged to contact me if any questions arise or if further follow up is desired.   Ronald Barr Ronald Kindred, PsyD, ABN Clinical Neuropsychologist  Service(s) Provided at This Encounter: 42 minutes [phone]; Conjoint therapy with patient present)

## 2020-04-26 NOTE — Patient Instructions (Addendum)
We discussed your presentation and performance, which was very good in many areas, although there was evidence of memory storage problems. Given your well preserved functioning, I think that Amnestic Mild Cognitive Impairment is the best diagnosis at this time.   The major difference between mild cognitive impairment (MCI) and dementia is in severity and potential prognosis. Once someone reaches a level of severity adequate to be diagnosed with a dementia, there is usually progression over time, though this may be years. On the other hand, mild cognitive impairment, while a significant risk for dementia in future, does not always progress to dementia, and in some instances stays the same or can even revert to normal. It is important to realize that if MCI is due to underlying Alzheimer's disease, it will most likely progress to dementia eventually. The rate of conversion to Alzheimer's dementia from amnestic MCI is about 15% per year versus the general population risk of conversion of 2% per year.   We discussed medications and I suggested that you follow all of Dr. Irene Limbo advice.   We discussed a referral to Alzheimer's association for your wife, for support groups, and they should be in touch.   We discussed palliative strategies to delay progression, including diet, lifestyle, and exercise.   Consider asking Dr. Nani Ravens for a referral to [facility] who may be able to help you customize an exercise plan to do even with your hip pain. You were also going to look into water aeorobics at the [facility].  There is now good quality evidence from at least one large scale study that a modified mediterranean diet may help slow cognitive decline. This is known as the "MIND" diet. The Mind diet is not so much a specific diet as it is a set of recommendations for things that you should and should not eat.   Foods that are ENCOURAGED on the MIND Diet:  Green, leafy vegetables: Aim for six or more servings per  week. This includes kale, spinach, cooked greens and salads.  All other vegetables: Try to eat another vegetable in addition to the green leafy vegetables at least once a day. It is best to choose non-starchy vegetables because they have a lot of nutrients with a low number of calories.  Berries: Eat berries at least twice a week. There is a plethora of research on strawberries, and other berries such as blueberries, raspberries and blackberries have also been found to have antioxidant and brain health benefits.  Nuts: Try to get five servings of nuts or more each week. The creators of the [employer] don't specify what kind of nuts to consume, but it is probably best to vary the type of nuts you eat to obtain a variety of nutrients. Peanuts are a legume and do not fall into this category.  Olive oil: Use olive oil as your main cooking oil. There may be other heart-healthy alternatives such as algae oil, though there is not yet sufficient research upon which to base a formal recommendation.  Whole grains: Aim for at least three servings daily. Choose minimally processed grains like oatmeal, quinoa, brown rice, whole-wheat pasta and 100% whole-wheat bread.  Fish: Eat fish at least once a week. It is best to choose fatty fish like salmon, sardines, trout, tuna and mackerel for their high amounts of omega-3 fatty acids.  Beans: Include beans in at least four meals every week. This includes all beans, lentils and soybeans.  Poultry: Try to eat chicken or [place] at least  twice a week. Note that fried chicken is not encouraged on the MIND diet.  Wine: Aim for no more than one glass of alcohol daily. Both red and white wine may benefit the brain. However, much research has focused on the red wine compound resveratrol, which may help protect against Alzheimer's disease.  Foods that are DISCOURAGED on the MIND Diet: Butter and margarine: Try to eat less than 1 tablespoon (about 14 grams) daily. Instead, try using  olive oil as your primary cooking fat, and dipping your bread in olive oil with herbs.  Cheese: The MIND diet recommends limiting your cheese consumption to less than once per week.  Red meat: Aim for no more than three servings each week. This includes all beef, pork, lamb and products made from these meats.  [place] food: The MIND diet highly discourages fried food, especially the kind from fast-food restaurants. Limit your consumption to less than once per week.  Pastries and sweets: This includes most of the processed junk food and desserts you can think of. Ice cream, cookies, brownies, snack cakes, donuts, candy and more. Try to limit these to no more than four times a week.  Exercise is one of the best medicines for promoting health and maintaining cognitive fitness at all stages in life. Exercise probably has the largest documented effect on brain health and performance of any lifestyle intervention. Studies have shown that even previously sedentary individuals who start exercising as late as age 82 show a significant survival benefit as compared to their non-exercising peers. In the [place], the current guidelines are for 30 minutes of moderate exercise per day, but increasing your activity level less than that may also be helpful. You do not have to get your 30 minutes of exercise in one shot and exercising for short periods of time spread throughout the day can be helpful. Go for several walks, learn to dance, or do something else you enjoy that gets your body moving. Of course, if you have an underlying medical condition or there is any question about whether it is safe for you to exercise, you should consult a medical treatment provider prior to beginning exercise.   Staying active mentally is crucially important. This can include formal types of brain training, such as cognitive rehabilitation, sudoku, crossword puzzles and the like, although the best thing is to stay active and engaged in  your life. This includes having meaningful hobbies and interests that you pursue and especially cultivating satisfying social relationships, which are a form of cognitive stimulation and have been shown to be protective in and of themselves.    You should return for reevaluation in 1 to 2 years.

## 2020-05-02 DIAGNOSIS — R69 Illness, unspecified: Secondary | ICD-10-CM | POA: Diagnosis not present

## 2020-05-27 ENCOUNTER — Other Ambulatory Visit: Payer: Self-pay | Admitting: Family Medicine

## 2020-05-27 DIAGNOSIS — E119 Type 2 diabetes mellitus without complications: Secondary | ICD-10-CM

## 2020-05-27 DIAGNOSIS — K219 Gastro-esophageal reflux disease without esophagitis: Secondary | ICD-10-CM

## 2020-05-28 ENCOUNTER — Telehealth: Payer: Self-pay

## 2020-05-28 ENCOUNTER — Other Ambulatory Visit: Payer: Self-pay | Admitting: Family Medicine

## 2020-05-28 NOTE — Telephone Encounter (Signed)
One Touch products not covered by [employer], unsure which brand is preferred. Recommend Pt to call his insurance directly to find out.

## 2020-05-28 NOTE — Telephone Encounter (Signed)
Called and informed the message. He stated they have changed insurance companies for this year. Also he stated he has 3 full boxes currently of strips/lancets. He will however call and get the name of the covered testing supplies. He will then call us and we can update his medication list and send in when he is getting close to running out .

## 2020-05-29 DIAGNOSIS — X32XXXA Exposure to sunlight, initial encounter: Secondary | ICD-10-CM | POA: Diagnosis not present

## 2020-05-29 DIAGNOSIS — L821 Other seborrheic keratosis: Secondary | ICD-10-CM | POA: Diagnosis not present

## 2020-05-29 DIAGNOSIS — L570 Actinic keratosis: Secondary | ICD-10-CM | POA: Diagnosis not present

## 2020-06-19 ENCOUNTER — Other Ambulatory Visit: Payer: Self-pay | Admitting: Family Medicine

## 2020-06-19 MED ORDER — ACCU-CHEK GUIDE VI STRP: ORAL_STRIP | 3 refills | Status: DC

## 2020-06-19 MED ORDER — ACCU-CHEK AVIVA PLUS W/DEVICE KIT: PACK | 0 refills | Status: DC

## 2020-06-19 MED ORDER — ACCU-CHEK AVIVA PLUS VI STRP: ORAL_STRIP | 3 refills | Status: DC

## 2020-06-19 MED ORDER — ACCU-CHEK SOFTCLIX LANCETS MISC: 3 refills | Status: DC

## 2020-06-19 MED ORDER — ACCU-CHEK SOFTCLIX LANCET DEV KIT: PACK | 0 refills | Status: DC

## 2020-06-19 MED ORDER — ACCU-CHEK GUIDE W/DEVICE KIT: PACK | 0 refills | Status: DC

## 2020-06-20 ENCOUNTER — Other Ambulatory Visit: Payer: Self-pay | Admitting: Family Medicine

## 2020-06-24 ENCOUNTER — Telehealth: Payer: Self-pay | Admitting: Pulmonary Disease

## 2020-06-24 DIAGNOSIS — G4733 Obstructive sleep apnea (adult) (pediatric): Secondary | ICD-10-CM

## 2020-06-24 NOTE — Telephone Encounter (Signed)
Called spoke with patient.  Let him know I would send his request to Dr. Ander Slade and once we heard back from him we would share his recommendations with patient.   Dr. Ander Slade, please advise if you treat people with the inspire and if this patient is a candidate for it.

## 2020-06-25 NOTE — Telephone Encounter (Signed)
I have called the pt and he is aware of recs per AO.  He is ok with the referral to Dr. Redmond Baseman and the order has been placed. Nothing further is needed.

## 2020-06-25 NOTE — Telephone Encounter (Signed)
The process is to refer him to Dr. Redmond Baseman who does the inspire device for evaluation-he has to be evaluated to see if he qualifies. -Moderate to severe obstructive sleep apnea -Intolerance to CPAP/BiPAP  I am okay with if he wants a copy of his sleep study result I am also okay with if he wants to see another provider

## 2020-07-12 ENCOUNTER — Other Ambulatory Visit: Payer: Self-pay | Admitting: Family Medicine

## 2020-07-12 DIAGNOSIS — E119 Type 2 diabetes mellitus without complications: Secondary | ICD-10-CM

## 2020-07-23 DIAGNOSIS — G4733 Obstructive sleep apnea (adult) (pediatric): Secondary | ICD-10-CM | POA: Diagnosis not present

## 2020-08-30 ENCOUNTER — Other Ambulatory Visit: Payer: Self-pay

## 2020-08-30 ENCOUNTER — Ambulatory Visit (INDEPENDENT_AMBULATORY_CARE_PROVIDER_SITE_OTHER): Payer: Medicare HMO | Admitting: Podiatry

## 2020-08-30 ENCOUNTER — Encounter: Payer: Self-pay | Admitting: Podiatry

## 2020-08-30 DIAGNOSIS — E1149 Type 2 diabetes mellitus with other diabetic neurological complication: Secondary | ICD-10-CM | POA: Diagnosis not present

## 2020-08-30 DIAGNOSIS — M79674 Pain in right toe(s): Secondary | ICD-10-CM

## 2020-08-30 DIAGNOSIS — Q828 Other specified congenital malformations of skin: Secondary | ICD-10-CM

## 2020-08-30 DIAGNOSIS — M79675 Pain in left toe(s): Secondary | ICD-10-CM

## 2020-08-30 DIAGNOSIS — B351 Tinea unguium: Secondary | ICD-10-CM

## 2020-09-03 ENCOUNTER — Ambulatory Visit (INDEPENDENT_AMBULATORY_CARE_PROVIDER_SITE_OTHER): Payer: Medicare HMO | Admitting: Family Medicine

## 2020-09-03 ENCOUNTER — Other Ambulatory Visit: Payer: Self-pay | Admitting: Family Medicine

## 2020-09-03 ENCOUNTER — Other Ambulatory Visit: Payer: Self-pay

## 2020-09-03 ENCOUNTER — Encounter: Payer: Self-pay | Admitting: Family Medicine

## 2020-09-03 VITALS — BP 138/84 | HR 84 | Temp 97.9°F | Ht >= 80 in | Wt 288.4 lb

## 2020-09-03 DIAGNOSIS — Z136 Encounter for screening for cardiovascular disorders: Secondary | ICD-10-CM | POA: Diagnosis not present

## 2020-09-03 DIAGNOSIS — Z0000 Encounter for general adult medical examination without abnormal findings: Secondary | ICD-10-CM

## 2020-09-03 DIAGNOSIS — E1169 Type 2 diabetes mellitus with other specified complication: Secondary | ICD-10-CM | POA: Diagnosis not present

## 2020-09-03 DIAGNOSIS — E669 Obesity, unspecified: Secondary | ICD-10-CM | POA: Diagnosis not present

## 2020-09-03 LAB — COMPREHENSIVE METABOLIC PANEL
ALT: 26 U/L (ref 0–53)
AST: 31 U/L (ref 0–37)
Albumin: 4.4 g/dL (ref 3.5–5.2)
Alkaline Phosphatase: 48 U/L (ref 39–117)
BUN: 25 mg/dL — ABNORMAL HIGH (ref 6–23)
CO2: 28 mEq/L (ref 19–32)
Calcium: 10 mg/dL (ref 8.4–10.5)
Chloride: 104 mEq/L (ref 96–112)
Creatinine, Ser: 1.33 mg/dL (ref 0.40–1.50)
GFR: 53.01 mL/min — ABNORMAL LOW (ref >60.00)
Glucose, Bld: 175 mg/dL — ABNORMAL HIGH (ref 70–99)
Potassium: 4.3 mEq/L (ref 3.5–5.1)
Sodium: 140 mEq/L (ref 135–145)
Total Bilirubin: 0.6 mg/dL (ref 0.2–1.2)
Total Protein: 7.1 g/dL (ref 6.0–8.3)

## 2020-09-03 LAB — CBC
HCT: 42.2 % (ref 39.0–52.0)
Hemoglobin: 14.1 g/dL (ref 13.0–17.0)
MCHC: 33.5 g/dL (ref 30.0–36.0)
MCV: 90 fl (ref 78.0–100.0)
Platelets: 185 10*3/uL (ref 150.0–400.0)
RBC: 4.69 Mil/uL (ref 4.22–5.81)
RDW: 14.9 % (ref 11.5–15.5)
WBC: 5 10*3/uL (ref 4.0–10.5)

## 2020-09-03 LAB — LIPID PANEL
Cholesterol: 166 mg/dL (ref 0–200)
HDL: 38.3 mg/dL — ABNORMAL LOW (ref >39.00)
LDL Cholesterol: 105 mg/dL — ABNORMAL HIGH (ref 0–99)
NonHDL: 127.86
Total CHOL/HDL Ratio: 4
Triglycerides: 113 mg/dL (ref 0.0–149.0)
VLDL: 22.6 mg/dL (ref 0.0–40.0)

## 2020-09-03 LAB — MICROALBUMIN / CREATININE URINE RATIO
Creatinine,U: 95.9 mg/dL
Microalb Creat Ratio: 16.4 mg/g (ref 0.0–30.0)
Microalb, Ur: 15.7 mg/dL — ABNORMAL HIGH (ref 0.0–1.9)

## 2020-09-03 LAB — HEMOGLOBIN A1C: Hgb A1c MFr Bld: 8.7 % — ABNORMAL HIGH (ref 4.6–6.5)

## 2020-09-03 MED ORDER — TRESIBA FLEXTOUCH 100 UNIT/ML ~~LOC~~ SOPN: PEN_INJECTOR | SUBCUTANEOUS | 3 refills | Status: DC

## 2020-09-03 NOTE — Progress Notes (Signed)
Subjective: 75 y.o. returns the office today for painful calluses to his feet that he will have trimmed the right side worse than left and also for elongated toenails are causing discomfort mostly with shoes.  He has not seen any redness or swelling. Denies any open sores.  enies any systemic complaints such as fevers, chills, nausea, vomiting.   PCP: Shelda Pal, DO A1c: 7.8 on 03/05/2020  Objective: AAO 3, NAD DP/PT pulses palpable, CRT less than 3 seconds Hyperkeratotic lesions present bilateral submetatarsal one and 5.  No underlying ulceration, drainage or any signs of infection. Nails are hypertrophic, dystrophic, brittle, discolored, elongated 10. No surrounding redness or drainage. Tenderness nails 1-5 bilaterally.  No pain otherwise the left foot in particular. No open lesions or pre-ulcerative lesions are identified. No pain with calf compression, swelling, warmth, erythema.  Assessment: Patient presents with symptomatic hyperkeratotic lesions; symptomatic onychomycosis;  type 2 diabetes with neuropathy  Plan: -Treatment options including alternatives, risks, complications were discussed -Hyperkeratotic lesion sharply debrided x4 without any complications or bleeding -Nails debrided L89 with any complications or bleeding -Discussed daily foot inspection. If there are any changes, to call the office immediately.  -Follow-up in 3 months or sooner if any problems are to arise. In the meantime, encouraged to call the office with any questions, concerns, changes symptoms.  Celesta Gentile, DPM

## 2020-09-03 NOTE — Patient Instructions (Addendum)
Give [place] 2-3 business days to get the results of your labs back.   Keep the diet clean and stay active.  Someone will reach out separately regarding the ultrasound.   Let us know if you need anything.

## 2020-09-03 NOTE — Progress Notes (Signed)
Chief Complaint  Patient presents with  . Annual Exam    Well Male Ronald Barr is here for a complete physical.   His last physical was >1 year ago.  Current diet: in general, a "healthy" diet.   Current exercise: active in yard Weight trend: stable Fatigue out of ordinary? No. Seat belt? Yes.   Loss of interested in doing things or depression in past 2 weeks? No  Health maintenance Shingrix- No Colonoscopy- Yes Tetanus- Yes Hep C- Yes Pneumonia vaccine- Yes AAA screening- No  Past Medical History:  Diagnosis Date  . Cancer [facility])    prostate  . Diabetes mellitus without complication ([place])   . Essential hypertension 03/06/2016  . GERD (gastroesophageal reflux disease)   . Hyperlipidemia   . Sleep apnea    sleeps with BPAP machine     Past Surgical History:  Procedure Laterality Date  . nose and throat surgery     sleep apnes  . SHOULDER SURGERY    . TONSILLECTOMY      Medications  Current Outpatient Medications on File Prior to Visit  Medication Sig Dispense Refill  . Accu-Chek Softclix Lancets lancets Use daily to check blood sugar.  DX E11.9 100 each 3  . amLODipine (NORVASC) 10 MG tablet TAKE ONE TABLET BY MOUTH DAILY 90 tablet 0  . Blood Glucose Monitoring Suppl (ACCU-CHEK GUIDE) w/Device KIT Use daily to check blood sugar.  DXE11.9 1 kit 0  . cyclobenzaprine (FLEXERIL) 10 MG tablet TAKE 1 TABLET BY MOUTH AT BEDTIME 90 tablet 2  . fenofibrate (TRICOR) 145 MG tablet TAKE 1 TABLET BY MOUTH DAILY 90 tablet 0  . glipiZIDE (GLUCOTROL XL) 10 MG 24 hr tablet Take 2 tablets (20 mg total) by mouth daily. 180 tablet 1  . glucose blood (ACCU-CHEK GUIDE) test strip Use daily to check blood sugar.  DXE11.9 100 each 3  . insulin degludec (TRESIBA FLEXTOUCH) 100 UNIT/ML FlexTouch Pen Inject 30 units at bedtime and 10 units in the morning. 3 mL 3  . IRON PO Take 1 tablet by mouth daily.    . Lancets Misc. (ACCU-CHEK SOFTCLIX LANCET DEV) KIT Use daily to check blood sugar.   DXE11.9 1 kit 0  . MAGNESIUM PO Take 1 tablet by mouth 2 (two) times daily at 10 AM and 5 PM.    . metFORMIN (GLUCOPHAGE) 850 MG tablet Take 1 tablet (850 mg total) by mouth with breakfast, with lunch, and with evening meal. 270 tablet 1  . Multiple Vitamins-Minerals (MULTIVITAMIN PO) Take 1 tablet by mouth daily.    Marland Kitchen NOVOFINE PEN NEEDLE 32G X 6 MM MISC USE WITH TRESIBA TWO TIMES A DAY 100 each 0  . omeprazole (PRILOSEC) 20 MG capsule Take 1 capsule (20 mg total) by mouth daily. 90 capsule 3  . pioglitazone (ACTOS) 30 MG tablet TAKE ONE TABLET BY MOUTH DAILY 90 tablet 0  . pravastatin (PRAVACHOL) 10 MG tablet Take 1 tablet (10 mg total) by mouth daily. 90 tablet 1    Allergies Allergies  Allergen Reactions  . Statins     Cramping, intolerant of Crestor and Pravastatin  . Sulfa Antibiotics     Family History Family History  Problem Relation Age of Onset  . Hypertension Mother   . Hypertension Father     Review of Systems: Constitutional:  no fevers Eye:  no recent significant change in vision Ears:  No changes in hearing Nose/Mouth/Throat:  no complaints of nasal congestion, no sore throat Cardiovascular: no  chest pain Respiratory:  No shortness of breath Gastrointestinal:  No change in bowel habits GU:  No frequency Integumentary:  no abnormal skin lesions reported Neurologic:  no headaches Endocrine:  denies unexplained weight changes  Exam BP 138/84 (BP Location: Left Arm, Patient Position: Sitting, Cuff Size: Normal)   Pulse 84   Temp 97.9 F (36.6 C) (Oral)   Ht 6' 8" (2.032 m)   Wt 288 lb 6 oz (130.8 kg)   SpO2 96%   BMI 31.68 kg/m  General:  well developed, well nourished, in no apparent distress Skin:  no significant moles, warts, or growths Head:  no masses, lesions, or tenderness Eyes:  pupils equal and round, sclera anicteric without injection Ears:  canals without lesions, TMs shiny without retraction, no obvious effusion, no erythema Nose:  nares  patent, septum midline, mucosa normal Throat/Pharynx:  lips and gingiva without lesion; tongue and uvula midline; non-inflamed pharynx; no exudates or postnasal drainage Lungs:  clear to auscultation, breath sounds equal bilaterally, no respiratory distress Cardio:  regular rate and rhythm, no LE edema or bruits Rectal: Deferred GI: BS+, S, NT, ND, no masses or organomegaly Musculoskeletal:  symmetrical muscle groups noted without atrophy or deformity Neuro:  gait normal; deep tendon reflexes normal and symmetric; decreased sensation to pinprick on toes and distal forefoot b/l Psych: well oriented with normal range of affect and appropriate judgment/insight  Assessment and Plan  Well adult exam  Diabetes mellitus type 2 in obese ([place]) - Plan: Comprehensive metabolic panel, CBC, Hemoglobin A1c, Lipid panel, Microalbumin / creatinine urine ratio  Screening for AAA (abdominal aortic aneurysm) - Plan: US [facility] MEDICARE SCREENING   Well 75 y.o. male. Counseled on diet and exercise. Other orders as above. Ck [place] for AAA screen.  Follow up in 6 mo for DM visit or prn.  The patient voiced understanding and agreement to the plan.  [place], DO 09/03/20 9:36 AM

## 2020-09-05 ENCOUNTER — Ambulatory Visit (HOSPITAL_BASED_OUTPATIENT_CLINIC_OR_DEPARTMENT_OTHER)
Admission: RE | Admit: 2020-09-05 | Discharge: 2020-09-05 | Disposition: A | Discharge status: Home / Self Care | Payer: Medicare HMO | Source: Ambulatory Visit | Attending: Family Medicine | Admitting: Family Medicine

## 2020-09-05 ENCOUNTER — Other Ambulatory Visit: Payer: Self-pay

## 2020-09-05 DIAGNOSIS — Z87891 Personal history of nicotine dependence: Secondary | ICD-10-CM | POA: Insufficient documentation

## 2020-09-05 DIAGNOSIS — Z136 Encounter for screening for cardiovascular disorders: Secondary | ICD-10-CM | POA: Insufficient documentation

## 2020-09-25 ENCOUNTER — Other Ambulatory Visit: Payer: Self-pay | Admitting: Family Medicine

## 2020-09-25 DIAGNOSIS — IMO0002 Reserved for concepts with insufficient information to code with codable children: Secondary | ICD-10-CM

## 2020-09-25 DIAGNOSIS — E1165 Type 2 diabetes mellitus with hyperglycemia: Secondary | ICD-10-CM

## 2020-10-13 ENCOUNTER — Other Ambulatory Visit: Payer: Self-pay | Admitting: Family Medicine

## 2020-10-13 DIAGNOSIS — E119 Type 2 diabetes mellitus without complications: Secondary | ICD-10-CM

## 2020-11-01 ENCOUNTER — Ambulatory Visit: Payer: Medicare HMO | Admitting: Podiatry

## 2020-11-01 ENCOUNTER — Other Ambulatory Visit: Payer: Self-pay

## 2020-11-01 ENCOUNTER — Encounter: Payer: Self-pay | Admitting: Podiatry

## 2020-11-01 DIAGNOSIS — M79675 Pain in left toe(s): Secondary | ICD-10-CM | POA: Diagnosis not present

## 2020-11-01 DIAGNOSIS — M79674 Pain in right toe(s): Secondary | ICD-10-CM | POA: Diagnosis not present

## 2020-11-01 DIAGNOSIS — E1149 Type 2 diabetes mellitus with other diabetic neurological complication: Secondary | ICD-10-CM | POA: Diagnosis not present

## 2020-11-01 DIAGNOSIS — Q828 Other specified congenital malformations of skin: Secondary | ICD-10-CM

## 2020-11-01 DIAGNOSIS — B351 Tinea unguium: Secondary | ICD-10-CM

## 2020-11-01 NOTE — Progress Notes (Signed)
Subjective: 75 y.o. returns the office today for painful calluses to his feet that he will have trimmed the right side worse than left and also for elongated toenails are causing discomfort mostly with shoes.  He has not seen any redness or swelling. Denies any open sores.  Denies any systemic complaints such as fevers, chills, nausea, vomiting.   PCP: Shelda Pal, DO last seen 09/03/2020 A1c: 8.7 on 09/03/2020  Objective: AAO 3, NAD DP/PT pulses palpable, CRT less than 3 seconds Hyperkeratotic lesions present bilateral submetatarsal one and 5.  No underlying ulceration, drainage or any signs of infection.  Upon debridement of right submetatarsal 5 lesion some dried blood was present there is no ulcerations identified or signs of infection. Nails are hypertrophic, dystrophic, brittle, discolored, elongated 10. No surrounding redness or drainage. Tenderness nails 1-5 bilaterally.  No pain otherwise the left foot in particular. No open lesions or pre-ulcerative lesions are identified. No pain with calf compression, swelling, warmth, erythema.  Assessment: Patient presents with symptomatic hyperkeratotic lesions; symptomatic onychomycosis;  type 2 diabetes with neuropathy  Plan: -Treatment options including alternatives, risks, complications were discussed -Hyperkeratotic lesion sharply debrided x4 without any complications or bleeding -Nails debrided P82 with any complications or bleeding -Discussed daily foot inspection. If there are any changes, to call the office immediately.  -Follow-up in 9 weeks or sooner if any problems are to arise. In the meantime, encouraged to call the office with any questions, concerns, changes symptoms.  Celesta Gentile, DPM

## 2020-11-24 ENCOUNTER — Other Ambulatory Visit: Payer: Self-pay | Admitting: Family Medicine

## 2020-11-24 DIAGNOSIS — E119 Type 2 diabetes mellitus without complications: Secondary | ICD-10-CM

## 2020-11-29 ENCOUNTER — Other Ambulatory Visit: Payer: Self-pay | Admitting: Family Medicine

## 2020-11-29 MED ORDER — TRESIBA FLEXTOUCH 100 UNIT/ML ~~LOC~~ SOPN: PEN_INJECTOR | SUBCUTANEOUS | 3 refills | Status: DC

## 2020-12-04 ENCOUNTER — Ambulatory Visit (INDEPENDENT_AMBULATORY_CARE_PROVIDER_SITE_OTHER): Payer: Medicare HMO | Admitting: Family Medicine

## 2020-12-04 ENCOUNTER — Encounter: Payer: Self-pay | Admitting: Family Medicine

## 2020-12-04 ENCOUNTER — Other Ambulatory Visit: Payer: Self-pay | Admitting: Family Medicine

## 2020-12-04 ENCOUNTER — Other Ambulatory Visit: Payer: Self-pay

## 2020-12-04 VITALS — BP 120/68 | HR 93 | Temp 97.9°F | Ht >= 80 in | Wt 290.5 lb

## 2020-12-04 DIAGNOSIS — E669 Obesity, unspecified: Secondary | ICD-10-CM

## 2020-12-04 DIAGNOSIS — E1169 Type 2 diabetes mellitus with other specified complication: Secondary | ICD-10-CM

## 2020-12-04 LAB — HEMOGLOBIN A1C: Hgb A1c MFr Bld: 8.1 % — ABNORMAL HIGH (ref 4.6–6.5)

## 2020-12-04 MED ORDER — TRESIBA FLEXTOUCH 100 UNIT/ML ~~LOC~~ SOPN: PEN_INJECTOR | SUBCUTANEOUS | 3 refills | Status: DC

## 2020-12-04 NOTE — Patient Instructions (Signed)
Give [place] 2-3 business days to get the results of your labs back.   Stay active, make healthy eating choices.   Continue to monitor sugars at home.  Let us know if you need anything.

## 2020-12-04 NOTE — Progress Notes (Signed)
Subjective:   Chief Complaint  Patient presents with   Follow-up    Ronald Barr is a 75 y.o. male here for follow-up of diabetes.   Ronald Barr's self monitored glucose range is 150's random, fasting are low 100's.  Patient denies hypoglycemic reactions. He checks his glucose levels 2 time(s) per day. Patient does require insulin- Tresiba 39 u qhs, 27 u in AM.   Medications include: Glipizide XL 20 mg/d, metformin 850 mg TID, Actos 30 mg/d Diet is fair.  Exercise: none No CP or SOB.   Past Medical History:  Diagnosis Date   Cancer [facility])    prostate   Diabetes mellitus without complication ([employer])    Essential hypertension 03/06/2016   GERD (gastroesophageal reflux disease)    Hyperlipidemia    Sleep apnea    sleeps with BPAP machine     Related testing: Retinal exam: Done Pneumovax: done  Objective:  BP 120/68   Pulse 93   Temp 97.9 F (36.6 C) (Oral)   Ht '6\' 8"'$  (2.032 m)   Wt 290 lb 8 oz (131.8 kg)   SpO2 95%   BMI 31.91 kg/m  General:  Well developed, well nourished, in no apparent distress Skin:  Warm, no pallor or diaphoresis Lungs:  CTAB, no access msc use Cardio:  RRR, no bruits, no LE edema Psych: Age appropriate judgment and insight  Assessment:   Diabetes mellitus type 2 in obese ([place]) - Plan: Hemoglobin A1c   Plan:   Chronic, uncontrolled. Cont Tresiba 38 u qhs, 14 u in AM, Metformin850 mg TID, Actos 30 mg/d, Glipizide 20 mg/d. Counseled on diet and exercise. F/u in 3-6 mo pending above. The patient voiced understanding and agreement to the plan.  [place], DO 12/04/20 9:59 AM

## 2020-12-17 DIAGNOSIS — L821 Other seborrheic keratosis: Secondary | ICD-10-CM | POA: Diagnosis not present

## 2020-12-17 DIAGNOSIS — L820 Inflamed seborrheic keratosis: Secondary | ICD-10-CM | POA: Diagnosis not present

## 2020-12-24 ENCOUNTER — Ambulatory Visit (INDEPENDENT_AMBULATORY_CARE_PROVIDER_SITE_OTHER): Payer: Medicare HMO | Admitting: Neurology

## 2020-12-24 ENCOUNTER — Encounter: Payer: Self-pay | Admitting: Neurology

## 2020-12-24 VITALS — BP 149/92 | HR 76 | Ht >= 80 in | Wt 289.5 lb

## 2020-12-24 DIAGNOSIS — G4730 Sleep apnea, unspecified: Secondary | ICD-10-CM | POA: Diagnosis not present

## 2020-12-24 DIAGNOSIS — M2619 Other specified anomalies of jaw-cranial base relationship: Secondary | ICD-10-CM | POA: Insufficient documentation

## 2020-12-24 NOTE — Progress Notes (Signed)
SLEEP MEDICINE CLINIC    Provider:  Larey Seat, MD  Primary Care Physician:  Shelda Pal, [address] [place] [address]     Referring Provider: Melida Quitter, [address],  [place] [address]          Chief Complaint according to patient   Patient presents with:     New Patient (Initial Visit)     Paper referral, prior sleep study in March 2020. Has been on BiPAP for over 20 years, current BiPAP is 74 years old. Pt states he wishes to learn more about Inspire. He wishes to sleep better without having to use a mask.       HISTORY OF PRESENT ILLNESS:  Ronald Barr is a 75 y.o. year old White or Caucasian male patient seen here as a referral on 12/24/2020 from ENT  to discuss inspire  Chief concern according to patient :  I had my first sleep stdy in s. [place], [place] and it ws over 20 years ago- and after an hour I was "emergently " placed on CPAP. Nasal mask, last sleep study with [facility] lab was changed to a FFM. He is here to learn about INSPIRE. Uses BiPAP .  A HST was performed in January same year and  the results are not found in epic.      I have the pleasure of seeing Ronald Barr today, a right-handed White or Caucasian male with a possible sleep disorder.   He has a past medical history of Cancer ([place]), Diabetes mellitus without complication ([place]), Essential hypertension (03/06/2016), GERD (gastroesophageal reflux disease), Hyperlipidemia, and Sleep apnea.he had turbinate reduction and uvulectomy.    The last sleep study was a CPAP titration performed on 20 July 2018 under the guidance of Dr. Ander Slade critical care pulmonology physician.  He describes that [name] has a history of diabetes, hypertension previously established diagnosis of obstructive sleep apnea.  A total sleep time of 323 minutes was confirmed.  Sleep efficiency in the titration study was only 77%.  REM latency was prolonged at 128  minutes.  The overall AHI was 7.1/h.  The most appropriate setting for the patient was CPAP at 17 cmH2O according to this note.  Sleep efficiency however at that setting was only 42%.  There were also frequent leg movements but few of them leading to arousals from sleep.  Mr. Detjen uses a BiPAP with a setting of 12/8 cmH2O is easy breeze function this is an air curve 10 V auto with a serial #[id number] his residual AHI is 1.6/h his 95th percentile air leak is very high 105 L/min.  So I am surprised that he has such good apnea control looking at this air leak but I think it is related to his full facemask leaking and it is harder to control the leak of a full facemask but also nasal mask or nasal pillow.  However the high of the pressure applied the poor is a tolerance and a normal facemask user.  The patient had the first sleep study in the year 2000 or earlier and was diagnosed with OSA.   Sleep relevant medical history: no Nocturia/ Enuresis , no Sleep walking, no REM BD , Tonsillectomy, uvuloplasty , nasal turbinate reduction- UPPP?Hip arthritis.     Family medical /sleep history: no other family member on CPAP with OSA, insomnia, sleep walkers.    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   He lives in a household with spouse, no pets . Tobacco use/ 1981.  ETOH use: rare , Caffeine intake in form of Coffee( 1 cup in AM ) Soda( sometimes ) , Regular exercise - not established.    Sleep habits are as follows: The patient's dinner time is between 6 PM. The patient goes to bed at 11 PM and continues to sleep for 5-7 hours, wakes for one  bathroom break.   The preferred sleep position is right side, with the support of 1 pillow- on a flat bed.  Dreams are reportedly in frequent.  9  AM is the usual rise time. The patient wakes up spontaneously.  He reports he is  feeling refreshed or restored in AM, with symptoms such as dry mouth,  and residual fatigue. Naps are  taken infrequently.   Review of Systems: Out of a complete 14 system review, the patient complains of only the following symptoms, and all other reviewed systems are negative.:     How likely are you to doze in the following situations: 0 = not likely, 1 = slight chance, 2 = moderate chance, 3 = high chance   Sitting and Reading? Watching Television? Sitting inactive in a public place (theater or meeting)? As a passenger in a car for an hour without a break? Lying down in the afternoon when circumstances permit? Sitting and talking to someone? Sitting quietly after lunch without alcohol? In a car, while stopped for a few minutes in traffic?   Total = 10 / 24 points   FSS endorsed at 9/ 63 points.   Social History   Socioeconomic History   Marital status: Married    Spouse name: Tharon Aquas   Number of children: 0   Years of education: Not on file   Highest education level: Master's degree (e.g., MA, MS, MEng, MEd, MSW, MBA)  Occupational History   Not on file  Tobacco Use   Smoking status: Former    Types: Cigarettes    Quit date: 06/01/1979    Years since quitting: 41.5   Smokeless tobacco: Never  Substance and Sexual Activity   Alcohol use: Not Currently   Drug use: No   Sexual activity: Not on file  Other Topics Concern   Not on file  Social History Narrative   Lives with wife   Right handed   Caffeine: 1 cup of coffee and I soda can a day   Social Determinants of Radio broadcast assistant Strain: Not on file  Food Insecurity: Not on file  Transportation Needs: Not on file  Physical Activity: Not on file  Stress: Not on file  Social Connections: Not on file    Family History  Problem Relation Age of Onset   Hypertension Mother    Hypertension Father     Past Medical History:  Diagnosis Date   Cancer [facility])    prostate   Diabetes mellitus without complication ([place])    Essential hypertension 03/06/2016   GERD (gastroesophageal reflux disease)     Hyperlipidemia    Sleep apnea    sleeps with BPAP machine    Past Surgical History:  Procedure Laterality Date   nose and throat surgery     sleep apnes   SHOULDER SURGERY     TONSILLECTOMY       Current Outpatient Medications on File Prior to Visit  Medication Sig Dispense Refill   Accu-Chek Softclix Lancets lancets USE TO  CHECK BLOOD SUGAR DAILY 100 each 12   amLODipine (NORVASC) 10 MG tablet TAKE ONE TABLET BY MOUTH DAILY 90 tablet 0   Blood Glucose Monitoring Suppl (ACCU-CHEK GUIDE) w/Device KIT Use daily to check blood sugar.  DXE11.9 1 kit 0   cyclobenzaprine (FLEXERIL) 10 MG tablet TAKE 1 TABLET BY MOUTH AT BEDTIME 90 tablet 2   fenofibrate (TRICOR) 145 MG tablet TAKE ONE TABLET BY MOUTH DAILY 90 tablet 0   glipiZIDE (GLUCOTROL XL) 10 MG 24 hr tablet Take 2 tablets (20 mg total) by mouth daily. 180 tablet 1   glucose blood (ACCU-CHEK GUIDE) test strip Use daily to check blood sugar.  DXE11.9 100 each 3   insulin degludec (TRESIBA FLEXTOUCH) 100 UNIT/ML FlexTouch Pen Inject 41 units at bedtime and 17 units in the morning. 6 mL 3   IRON PO Take 1 tablet by mouth daily.     Lancets Misc. (ACCU-CHEK SOFTCLIX LANCET DEV) KIT Use daily to check blood sugar.  DXE11.9 1 kit 0   MAGNESIUM PO Take 1 tablet by mouth 2 (two) times daily at 10 AM and 5 PM.     metFORMIN (GLUCOPHAGE) 850 MG tablet TAKE 1 TABLET BY MOUTH WITH BREAKFAST, WITH LUNCH AND WITH EVENING MEAL 270 tablet 1   Multiple Vitamins-Minerals (MULTIVITAMIN PO) Take 1 tablet by mouth daily.     NOVOFINE PEN NEEDLE 32G X 6 MM MISC USE WITH TRESIBA TWO TIMES A DAY 100 each 0   omeprazole (PRILOSEC) 20 MG capsule Take 1 capsule (20 mg total) by mouth daily. 90 capsule 3   pioglitazone (ACTOS) 30 MG tablet TAKE ONE TABLET BY MOUTH DAILY 90 tablet 0   pravastatin (PRAVACHOL) 10 MG tablet TAKE ONE TABLET BY MOUTH DAILY 90 tablet 1   No current facility-administered medications on file prior to visit.    Allergies  Allergen  Reactions   Statins     Cramping, intolerant of Crestor and Pravastatin   Sulfa Antibiotics     Physical exam:  Today's Vitals   12/24/20 0949  BP: (!) 149/92  Pulse: 76  Weight: 289 lb 8 oz (131.3 kg)  Height: '6\' 8"'  (2.032 m)   Body mass index is 31.8 kg/m.   Wt Readings from Last 3 Encounters:  12/24/20 289 lb 8 oz (131.3 kg)  12/04/20 290 lb 8 oz (131.8 kg)  09/03/20 288 lb 6 oz (130.8 kg)     Ht Readings from Last 3 Encounters:  12/24/20 '6\' 8"'  (2.032 m)  12/04/20 '6\' 8"'  (2.032 m)  09/03/20 '6\' 8"'  (2.032 m)      General: The patient is awake, alert and appears not in acute distress. The patient is well groomed. Head: Normocephalic, atraumatic. Neck is supple. Mallampati ,  neck circumference: 18 inches .  Nasal airflow  patent. Septal deviation to the right  Retrognathia is strong -major, small oral opening.   Dental status: biological  Cardiovascular:  Regular rate and cardiac rhythm by pulse,  without distended neck veins. Respiratory: Lungs are clear to auscultation.  Skin:  Without evidence of ankle edema, or rash. Trunk: The patient's posture is erect.   Neurologic exam : The patient is awake and alert, oriented to place and time.   Memory subjective described as delayed  Attention span & concentration ability appears normal.  Speech is fluent,  without  dysarthria, dysphonia or aphasia.  Mood and affect are appropriate.   Cranial nerves: no loss of smell or taste reported  Pupils are equal  and briskly reactive to light. Funduscopic exam deferred. .  Extraocular movements in vertical and horizontal planes were intact and without nystagmus. No Diplopia. Visual fields by finger perimetry are intact. Hearing was intact to soft voice and finger rubbing.    Facial sensation intact to fine touch.  Facial motor strength is symmetric and tongue and uvula move midline.  Neck ROM : rotation, tilt and flexion extension were normal for age and shoulder shrug was  symmetrical.    Motor exam:  Symmetric bulk, tone and ROM.   Normal tone without cog-wheeling, symmetric grip strength .   Sensory:  Fine touch, pinprick and vibration were tested  and  normal.  Proprioception tested in the upper extremities was normal.   Coordination: Rapid alternating movements in the fingers/hands were of normal speed.  The Finger-to-nose maneuver was intact without evidence of ataxia, dysmetria or tremor.   Gait and station: Patient could rise unassisted from a seated position, walked without assistive device.  Stance is of normal width/ base and the patient turned with 3 steps.  Toe and heel walk were deferred.  Deep tendon reflexes: in the  upper and lower extremities are symmetric and intact.  Babinski response was deferred.        After spending a total time of  45  minutes face to face and additional time for physical and neurologic examination, review of laboratory studies,  personal review of imaging studies, reports and results of other testing and review of referral information / records as far as provided in visit, I have established the following assessments:   [name] will has a longstanding history of rather it appears severe sleep apnea.  He required high pressures of CPAP and for comfort level has used BiPAP.  His last titration was 2-1/2 years ago and it seems to describe his CPAP titration however he is using BiPAP with a setting of 12/8 cmH2O and a residual AHI is only 1.6 which is a good success.  I see a lot of air leakage here and unfortunately his baseline study was a home sleep test of which no results have been published.  I will repeat that home sleep test as I have seen the hemogram and it seems to indicate that there is a good number of mixed and central apneas which may not respond to inspire.  So it is important to screen for the degree and type of sleep apnea before we make any decisions about it surgical treatment.  Looking at the rather  large leak I would discourage a full facemask the patient has retrognathia he has a small oral opening and a recessed chin the fullface mask rests on the chin pushing it actually for the back instead of moving it forward which would be helpful his obstructive airway conditions.  I would like for the patient to alternate at home or at least try a nasal mask again my suggestion would be in the sun ES ON nasal mask which most patients have found very comforting.  Mixed apnea on BiPAP. Retrognathia.    My Plan is to proceed with:  1)HST  2)order  nasal mask.  3) consider chin  strap.   I would like to thank Shelda Pal, DO and Melida Quitter, [address],  [place] [address] for allowing me to meet with and to take care of this pleasant patient.   In short, Ronald Barr is presenting with severe retrognathia, small upper airway  and large neck. ,established BiPAP user.  I plan to follow up either personally or through our NP within 3 month.   CC: I will share my notes with dr Redmond Baseman. .  Electronically signed by: Larey Seat, MD 12/24/2020 10:25 AM  [facility] Board certified by The [employer] of Sleep Medicine and Diplomate of the [employer] of Sleep Medicine. Board certified In Neurology through the [place], Fellow of the [employer] of Neurology. Medical Director of [facility].

## 2020-12-24 NOTE — Patient Instructions (Signed)
Sleep Apnea Sleep apnea is a condition in which breathing pauses or becomes shallow during sleep. People with sleep apnea usually snore loudly. They may have times when they gasp and stop breathing for 10 seconds or more during sleep. This mayhappen many times during the night. Sleep apnea disrupts your sleep and keeps your body from getting the rest that it needs. This condition can increase your risk of certain health problems, including: Heart attack. Stroke. Obesity. Type 2 diabetes. Heart failure. Irregular heartbeat. High blood pressure. The goal of treatment is to help you breathe normally again. What are the causes? The most common cause of sleep apnea is a collapsed or blocked airway. There are three kinds of sleep apnea: Obstructive sleep apnea. This kind is caused by a blocked or collapsed airway. Central sleep apnea. This kind happens when the part of the brain that controls breathing does not send the correct signals to the muscles that control breathing. Mixed sleep apnea. This is a combination of obstructive and central sleep apnea. What increases the risk? You are more likely to develop this condition if you: Are overweight. Smoke. Have a smaller than normal airway. Are older. Are male. Drink alcohol. Take sedatives or tranquilizers. Have a family history of sleep apnea. Have a tongue or tonsils that are larger than normal. What are the signs or symptoms? Symptoms of this condition include: Trouble staying asleep. Loud snoring. Morning headaches. Waking up gasping. Dry mouth or sore throat in the morning. Daytime sleepiness and tiredness. If you have daytime fatigue because of sleep apnea, you may be more likely to have: Trouble concentrating. Forgetfulness. Irritability or mood swings. Personality changes. Feelings of depression. Sexual dysfunction. This may include loss of interest if you are male, or erectile dysfunction if you are male. How is this  diagnosed? This condition may be diagnosed with: A medical history. A physical exam. A series of tests that are done while you are sleeping (sleep study). These tests are usually done in a sleep lab, but they may also be done at home. How is this treated? Treatment for this condition aims to restore normal breathing and to ease symptoms during sleep. It may involve managing health issues that can affect breathing, such as high blood pressure or obesity. Treatment may include: Sleeping on your side. Using a decongestant if you have nasal congestion. Avoiding the use of depressants, including alcohol, sedatives, and narcotics. Losing weight if you are overweight. Making changes to your diet. Quitting smoking. Using a device to open your airway while you sleep, such as: An oral appliance. This is a custom-made mouthpiece that shifts your lower jaw forward. A continuous positive airway pressure (CPAP) device. This device blows air through a mask when you breathe out (exhale). A nasal expiratory positive airway pressure (EPAP) device. This device has valves that you put into each nostril. A bi-level positive airway pressure (BPAP) device. This device blows air through a mask when you breathe in (inhale) and breathe out (exhale). Having surgery if other treatments do not work. During surgery, excess tissue is removed to create a wider airway. Follow these instructions at home: Lifestyle Make any lifestyle changes that your health care provider recommends. Eat a healthy, well-balanced diet. Take steps to lose weight if you are overweight. Avoid using depressants, including alcohol, sedatives, and narcotics. Do not use any products that contain nicotine or tobacco. These products include cigarettes, chewing tobacco, and vaping devices, such as e-cigarettes. If you need help quitting, ask your health  care provider. General instructions Take over-the-counter and prescription medicines only as told  by your health care provider. If you were given a device to open your airway while you sleep, use it only as told by your health care provider. If you are having surgery, make sure to tell your health care provider you have sleep apnea. You may need to bring your device with you. Keep all follow-up visits. This is important. Contact a health care provider if: The device that you received to open your airway during sleep is uncomfortable or does not seem to be working. Your symptoms do not improve. Your symptoms get worse. Get help right away if: You develop: Chest pain. Shortness of breath. Discomfort in your back, arms, or stomach. You have: Trouble speaking. Weakness on one side of your body. Drooping in your face. These symptoms may represent a serious problem that is an emergency. Do not wait to see if the symptoms will go away. Get medical help right away. Call your local emergency services (911 in the [place] Do not drive yourself to the hospital. Summary Sleep apnea is a condition in which breathing pauses or becomes shallow during sleep. The most common cause is a collapsed or blocked airway. The goal of treatment is to restore normal breathing and to ease symptoms during sleep. This information is not intended to replace advice given to you by your health care provider. Make sure you discuss any questions you have with your healthcare provider. Document Revised: 04/05/2020 Document Reviewed: 04/05/2020 Elsevier Patient Education  2022 [employer].

## 2020-12-24 NOTE — Progress Notes (Signed)
CM sent to [facility] 

## 2020-12-27 DIAGNOSIS — G4733 Obstructive sleep apnea (adult) (pediatric): Secondary | ICD-10-CM | POA: Diagnosis not present

## 2021-01-03 ENCOUNTER — Ambulatory Visit: Payer: Medicare HMO | Admitting: Podiatry

## 2021-01-03 ENCOUNTER — Other Ambulatory Visit: Payer: Self-pay

## 2021-01-03 ENCOUNTER — Encounter: Payer: Self-pay | Admitting: Podiatry

## 2021-01-03 DIAGNOSIS — B351 Tinea unguium: Secondary | ICD-10-CM

## 2021-01-03 DIAGNOSIS — M79675 Pain in left toe(s): Secondary | ICD-10-CM

## 2021-01-03 DIAGNOSIS — Q828 Other specified congenital malformations of skin: Secondary | ICD-10-CM

## 2021-01-03 DIAGNOSIS — E1149 Type 2 diabetes mellitus with other diabetic neurological complication: Secondary | ICD-10-CM

## 2021-01-03 DIAGNOSIS — M79674 Pain in right toe(s): Secondary | ICD-10-CM

## 2021-01-03 NOTE — Progress Notes (Signed)
Subjective: 75 y.o. returns the office today for painful calluses to his feet that he will have trimmed the right side worse than left and also for elongated toenails are causing discomfort mostly with shoes.  He has not seen any redness or swelling. Denies any open sores.  Denies any systemic complaints such as fevers, chills, nausea, vomiting.   PCP: Shelda Pal, DO last seen 09/03/2020 A1c: 8.1 on 12/04/2020  Objective: AAO 3, NAD DP/PT pulses palpable, CRT less than 3 seconds Hyperkeratotic lesions present bilateral submetatarsal one and 5.  No underlying ulceration, drainage or any signs of infection.  Upon debridement of right submetatarsal 5 lesion some dried blood was present there is no ulcerations identified or signs of infection. Nails are minimally hypertrophic, dystrophic, brittle, discolored, elongated 10. No surrounding redness or drainage. Tenderness nails 1-5 bilaterally.  No pain otherwise the left foot in particular. No open lesions or pre-ulcerative lesions are identified. No pain with calf compression, swelling, warmth, erythema.  Assessment: Patient presents with symptomatic hyperkeratotic lesions; symptomatic onychomycosis;  type 2 diabetes with neuropathy  Plan: -Treatment options including alternatives, risks, complications were discussed -Hyperkeratotic lesion sharply debrided x4 without any complications or bleeding -Nails debrided [id number] with any complications or bleeding as a courtesy  -Discussed daily foot inspection. If there are any changes, to call the office immediately.  -Follow-up in 9 weeks or sooner if any problems are to arise. In the meantime, encouraged to call the office with any questions, concerns, changes symptoms.  Celesta Gentile, DPM

## 2021-01-10 ENCOUNTER — Other Ambulatory Visit: Payer: Self-pay | Admitting: Family Medicine

## 2021-01-10 DIAGNOSIS — E119 Type 2 diabetes mellitus without complications: Secondary | ICD-10-CM

## 2021-01-12 IMAGING — DX DG FOOT COMPLETE 3+V*L*
3 series · 3 of 3 positions shown · non-contrast
Comparison: None.

CLINICAL DATA: Left foot pain for 5 days, swelling and redness,
possible ulcerative class hilus

EXAM:
LEFT FOOT - COMPLETE 3+ VIEW

[foot ap]
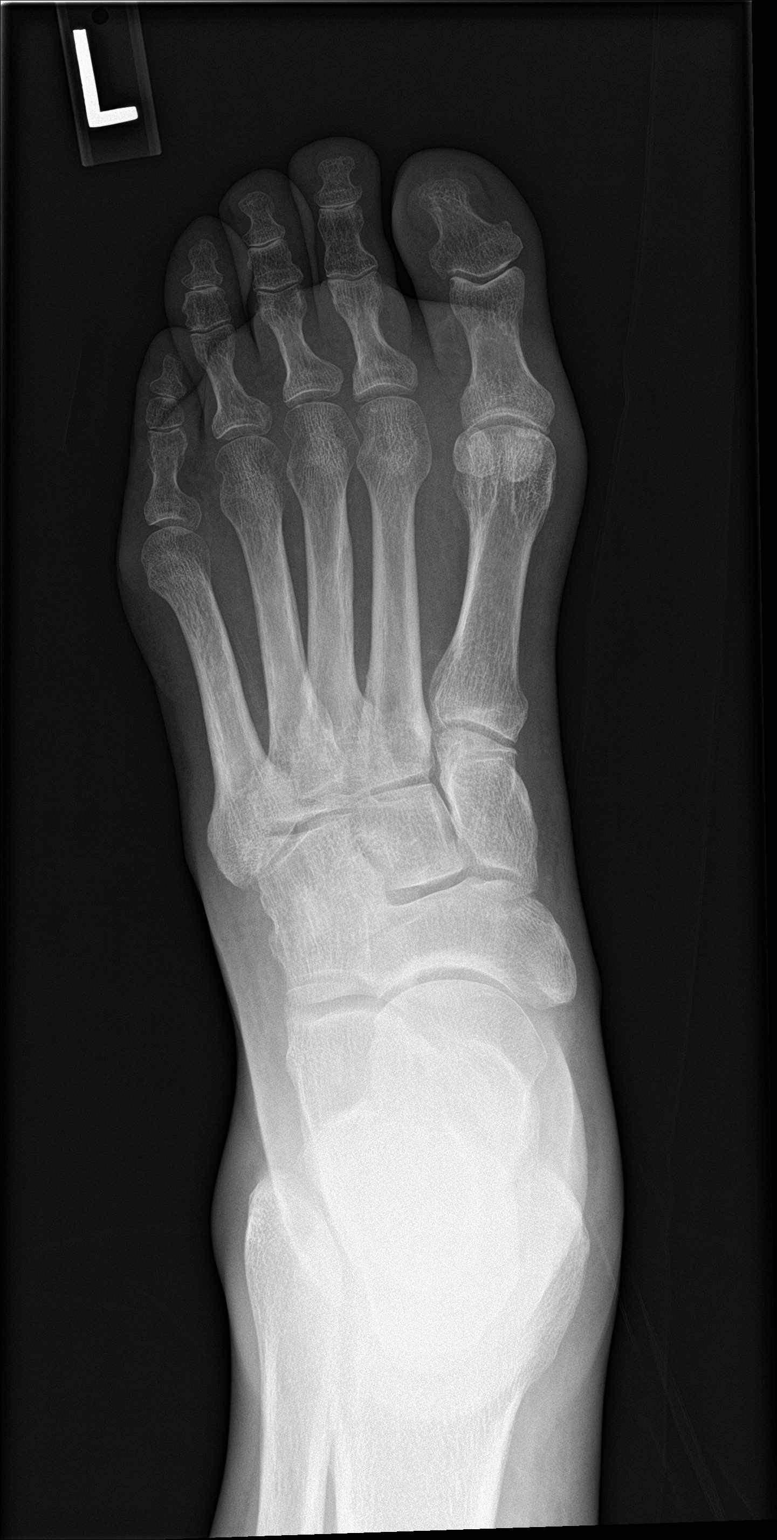

[foot obl]
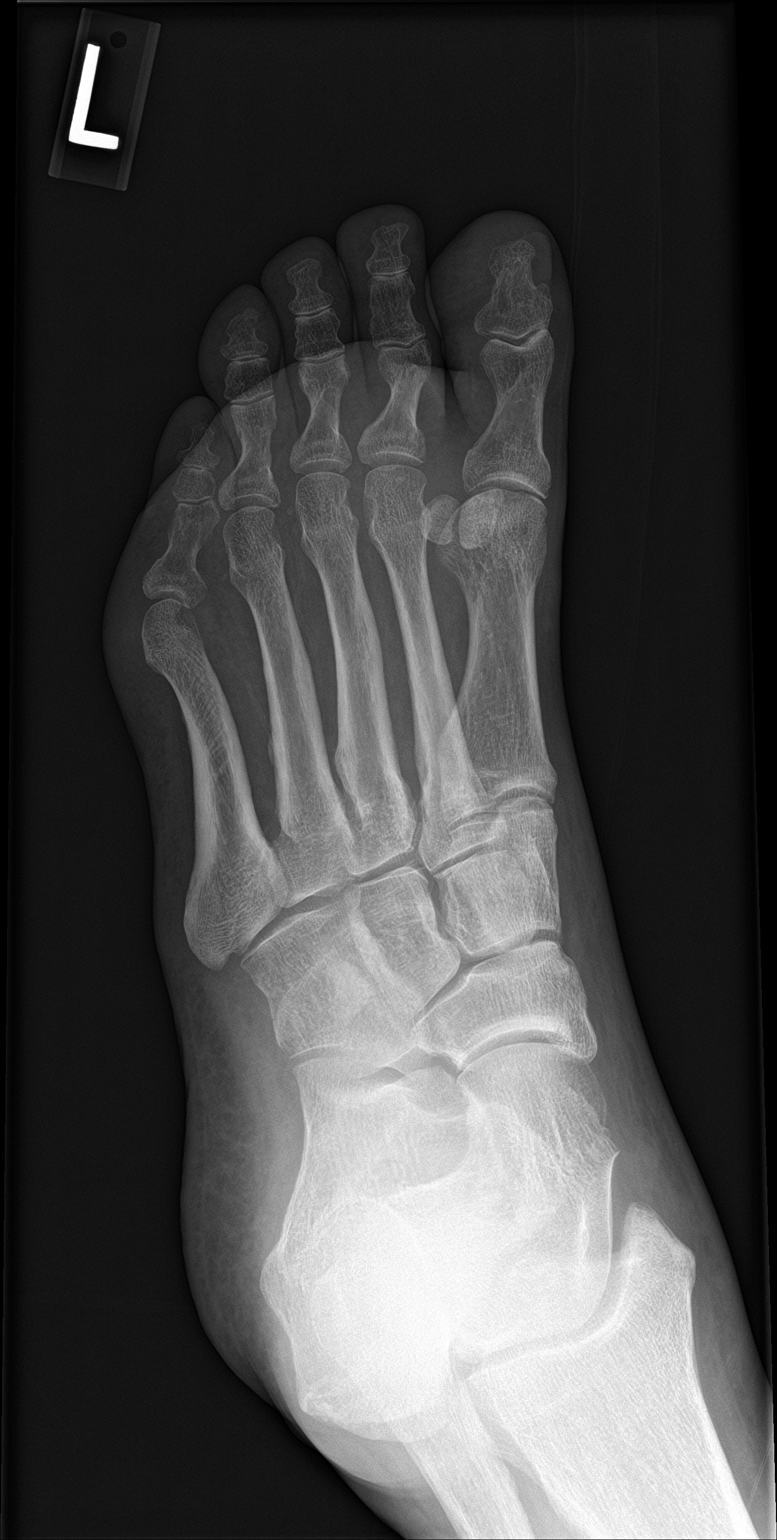

[foot lat]
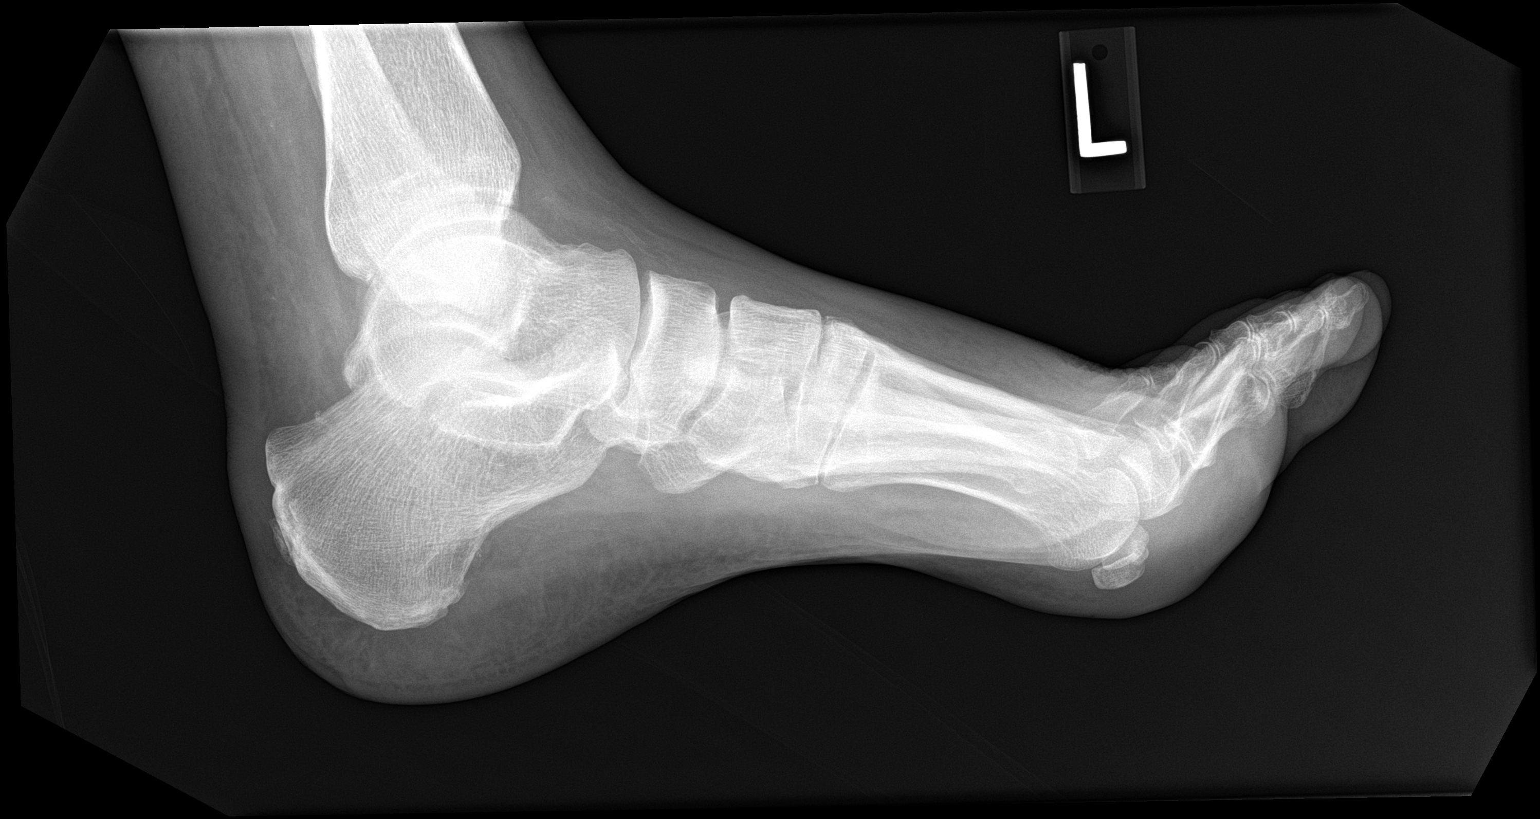

[3 of 3 positions shown; findings below may reference images not displayed]

FINDINGS: Mild soft tissue swelling noted about the forefoot from the ball of
the foot to the mid metatarsals. No radiographically evident focal
ulceration. No soft tissue gas. No osseous erosion, destructive
change or periostitis to suggest features of osteomyelitis. No acute
bony abnormality. Specifically, no fracture, subluxation, or
dislocation. The osseous structures appear diffusely demineralized
which may limit detection of small or nondisplaced fractures.
Vascular calcium noted in the soft tissues elsewhere. Mild
degenerative changes in the midfoot and interphalangeal joints.
IMPRESSION: Diffuse soft tissue swelling about the forefoot from the ball of the
foot to the mid metatarsals. No radiographically evident and
ulceration, soft tissue gas or features to suggest osteomyelitis.

## 2021-02-03 ENCOUNTER — Other Ambulatory Visit: Payer: Self-pay | Admitting: Family Medicine

## 2021-02-03 MED ORDER — FREESTYLE LIBRE 2 SENSOR MISC: 1 refills | Status: DC

## 2021-02-03 MED ORDER — FREESTYLE LIBRE 2 READER DEVI: 1 refills | Status: DC

## 2021-02-10 ENCOUNTER — Ambulatory Visit (INDEPENDENT_AMBULATORY_CARE_PROVIDER_SITE_OTHER): Payer: Medicare HMO | Admitting: Neurology

## 2021-02-10 DIAGNOSIS — G4733 Obstructive sleep apnea (adult) (pediatric): Secondary | ICD-10-CM | POA: Diagnosis not present

## 2021-02-10 DIAGNOSIS — G4730 Sleep apnea, unspecified: Secondary | ICD-10-CM

## 2021-02-10 DIAGNOSIS — M2619 Other specified anomalies of jaw-cranial base relationship: Secondary | ICD-10-CM

## 2021-02-20 NOTE — Progress Notes (Signed)
[facility] TEST REPORT ( by Watch PAT)   STUDY DATE:  02-10-2021 DOB:  06-04-1945    ORDERING CLINICIAN: Larey Seat, MD  REFERRING CLINICIANRedmond Barr, [name],MD   CLINICAL INFORMATION/HISTORY: Ronald Barr is a 75 year-old Caucasian male patient seen  on 12/24/2020 from ENT  to discuss Inspire  Chief concern according to patient :  I had my first sleep PSG in [place], [place] over 20 years ago- and after an hour I was "emergently " placed on CPAP. Nasal mask, last sleep study with [facility] lab was changed to a FFM. He is here to learn about INSPIRE. Uses BiPAP. Has retrognathia.  A HST was performed in January same year and  the results are not found in epic.  He has a past medical history of Cancer ([place]), Diabetes mellitus without complication ([place]), Essential hypertension (03/06/2016), GERD (gastroesophageal reflux disease), Hyperlipidemia, and continued Sleep apnea after he had undergone turbinate reduction and uvulectomy.    The last sleep study was a CPAP titration performed on 20 July 2018 under the guidance of Dr. Ander Barr critical care pulmonology physician. The overall AHI was not stated !.   The most appropriate setting for the patient was CPAP at 17 cmH2O according to this note.  Sleep efficiency however at that setting was only 42%.  There were also frequent leg movements but few of them leading to arousals from sleep. " The process is to refer him to Dr. Redmond Barr who does the inspire device for evaluation-he has to be evaluated to see if he qualifies. -Moderate to severe obstructive sleep apnea -Intolerance to CPAP/BiPAP   I am okay with if he wants a copy of his sleep study result I am also okay with if he wants to see another provider        Electronically signed by Laurin Coder, MD at 06/25/2020  7:56 AM     Epworth sleepiness score: 10/24. BMI: 33.4/kg/m  Neck Circumference: 18"   FINDINGS:   Sleep Summary:    This home sleep test  calculated a full recording time of 6 hours and 58 minutes of which 5 hours and 36 minutes were considered total sleep time, REM sleep proportion was 14.6%.                                       Respiratory Indices:   The overall calculated AHI or apnea hypopnea index was 88.3/h which is the severe apnea . AHI in REM sleep 103.3/h and in NREM sleep 85.8/h.                       Supine AHI: Positional apnea-hypopnea index for supine sleep was 18 1.2/h and in nonsupine sleep 109.1/h.  Snoring level reached a mean volume of 42 dB and was present for 20% of the total sleep time.                                                 Oxygen Saturation Statistics:   O2 Saturation Range (%): Oxygen saturation varied between a nadir of 79% and a maximum of 99% with a mean saturation at 92%.  O2 Saturation (minutes) <89%:   27.4 minutes, the equivalent of 8.2% of total sleep time.        Pulse Rate Statistics:               Pulse Range: Varied between 45 to 130 bpm with a mean heart rate of 67 bpm.               IMPRESSION:  This HST confirms the presence of very severe sleep apnea independent of REM versus non-REM sleep and independent of sleep position.  Hypoxia was noted but was not sustained.   RECOMMENDATION: Severe degree of apnea likely this is usually treated with positive airway pressure therapy.  This result exceeds the AHI range of 15-40 recommended for treatment by inspire device.  Inspire Apnea therapy reduces an AHI on average by 72% , which would leave this patient still with significant apnea.     INTERPRETING PHYSICIAN:   Ronald Seat, MD   Medical Director of [facility] at [facility].

## 2021-02-23 IMAGING — DX DG FOOT COMPLETE 3+V*L*
3 series · 3 of 3 positions shown · non-contrast
Comparison: None.

CLINICAL DATA: Left foot pain.

EXAM:
LEFT FOOT - COMPLETE 3+ VIEW

[foot ap]
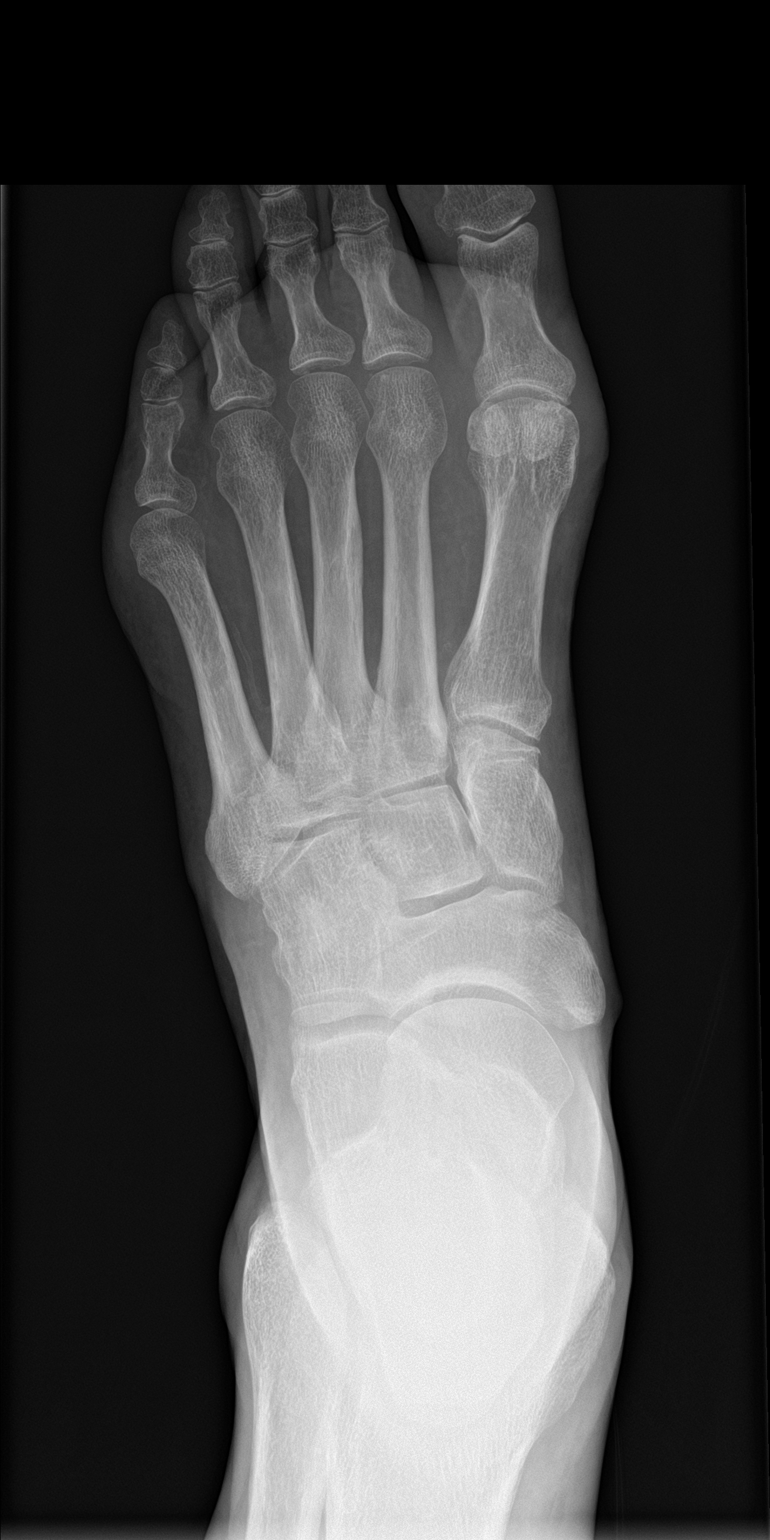

[foot obl]
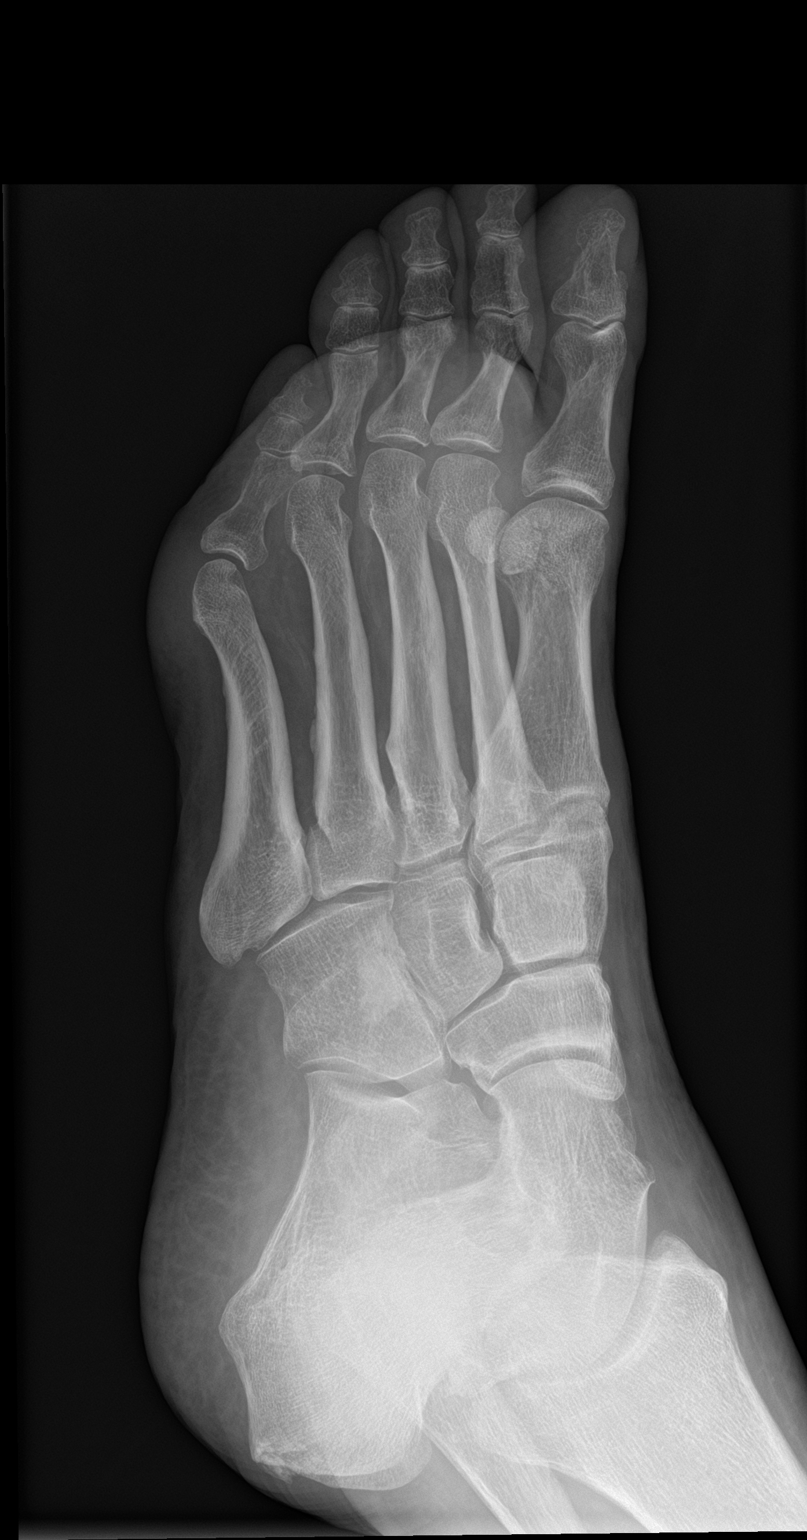

[foot lat]
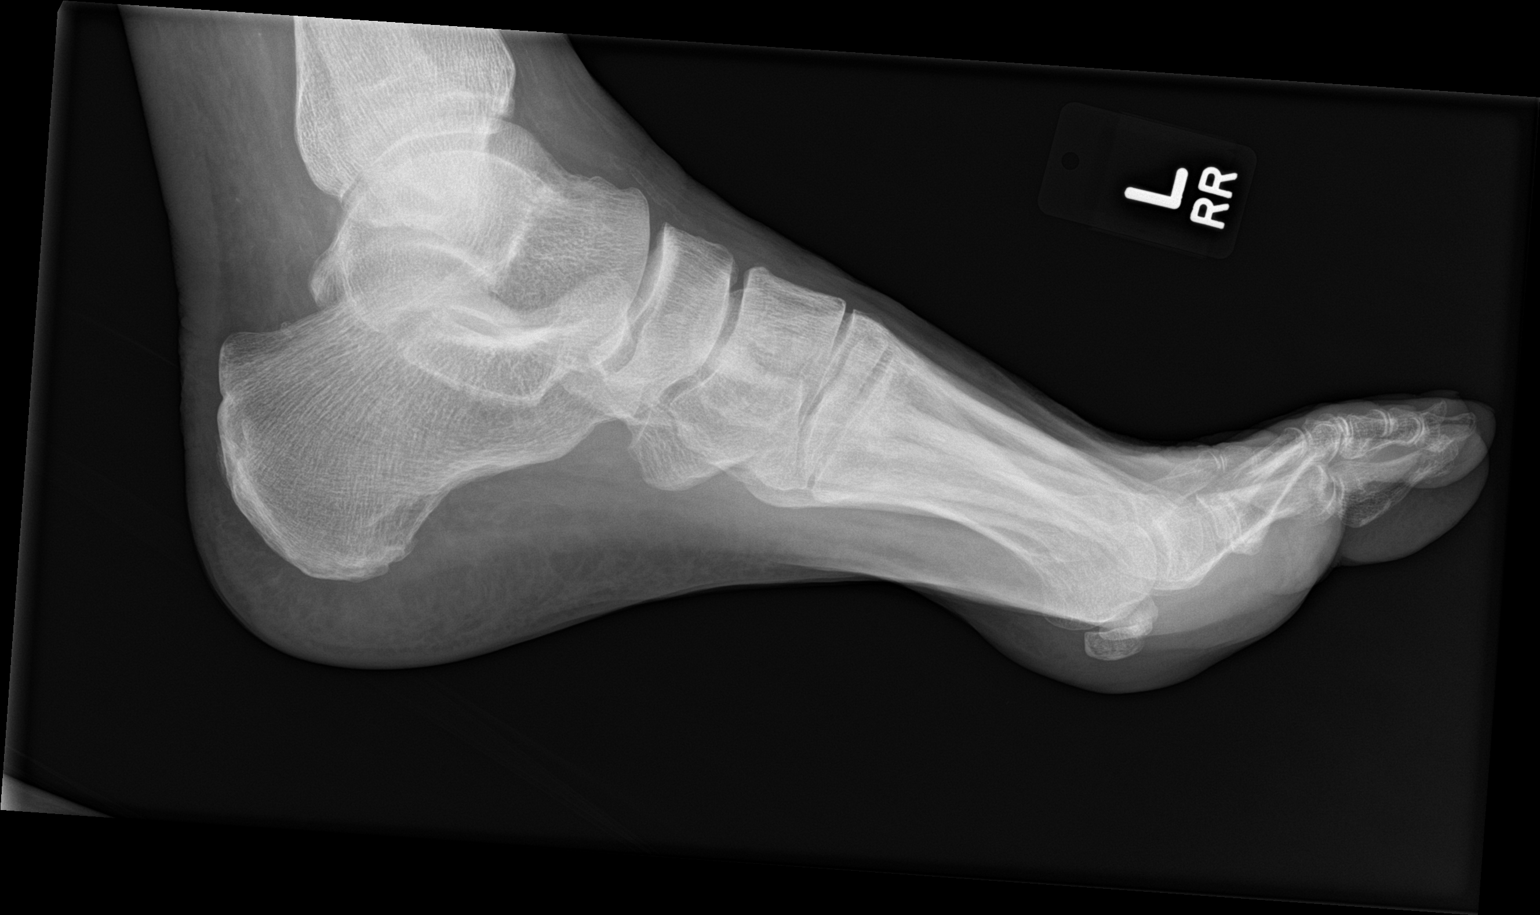

[3 of 3 positions shown; findings below may reference images not displayed]

FINDINGS: There is no evidence of fracture or dislocation. There is no
evidence of arthropathy or other focal bone abnormality. Soft
tissues are unremarkable.
IMPRESSION: No acute osseous injury of the left foot.

## 2021-02-26 ENCOUNTER — Encounter: Payer: Self-pay | Admitting: Neurology

## 2021-02-26 ENCOUNTER — Telehealth: Payer: Self-pay | Admitting: Neurology

## 2021-02-26 NOTE — Progress Notes (Signed)
Patient interested in [place]- Dr Redmond Baseman, MD referred.  The overall calculated AHI or apnea hypopnea index was 88.3/h which is the severe apnea . AHI in REM sleep 103.3/h and in NREM sleep 85.8/h.  Supine AHI: Positional apnea-hypopnea index for supine sleep was 18 1.2/h and in nonsupine sleep 109.1/h.  IMPRESSION:  This HST confirms the presence of very severe sleep apnea independent of REM versus non-REM sleep and independent of sleep position.  Hypoxia was noted but was not sustained.  RECOMMENDATION: Severe degree of apnea likely this is usually treated with positive airway pressure therapy.  This result exceeds the AHI range of 15-40 recommended for treatment by inspire device.  Inspire Apnea therapy reduces an AHI on average by 72% , which would leave this patient still with significant apnea.

## 2021-02-26 NOTE — Telephone Encounter (Signed)
-----   Message from Larey Seat, MD sent at 02/26/2021  8:41 AM EDT ----- Patient interested in [place]- Dr Redmond Baseman, MD referred.  The overall calculated AHI or apnea hypopnea index was 88.3/h which is the severe apnea . AHI in REM sleep 103.3/h and in NREM sleep 85.8/h.  Supine AHI: Positional apnea-hypopnea index for supine sleep was 18 1.2/h and in nonsupine sleep 109.1/h.  IMPRESSION:  This HST confirms the presence of very severe sleep apnea independent of REM versus non-REM sleep and independent of sleep position.  Hypoxia was noted but was not sustained.  RECOMMENDATION: Severe degree of apnea likely this is usually treated with positive airway pressure therapy.  This result exceeds the AHI range of 15-40 recommended for treatment by inspire device.  Inspire Apnea therapy reduces an AHI on average by 72% , which would leave this patient still with significant apnea.

## 2021-02-26 NOTE — Telephone Encounter (Signed)
Patient returned call.  I was able to review the sleep study results in detail with him.  Advised the patient that due to the results from the study it does not indicate him to be a candidate for inspire.  Patient verbalized understanding and will continue to use the BiPAP.  His BiPAP settings are currently treating his events.  Patient was advised to contact us if he has any problems. Advised the pt we would just need to make sure he makes a once a yr follow up for ordering supplies and checking DL. Pt verbalized understanding. Pt had no questions at this time but was encouraged to call back if questions arise.

## 2021-02-26 NOTE — Procedures (Signed)
[facility] TEST REPORT ( by Watch PAT)   STUDY DATE:  02-10-2021 DOB:  12-May-1945    ORDERING CLINICIAN: Larey Seat, MD  REFERRING CLINICIANRedmond Barr, [name],MD   CLINICAL INFORMATION/HISTORY: Ronald Barr is a 75 year-old Caucasian male patient seen  on 12/24/2020 from ENT  to discuss Inspire  Chief concern according to patient :  I had my first sleep PSG in [place], [place] over 20 years ago- and after an hour I was "emergently " placed on CPAP. Nasal mask, last sleep study with [facility] lab was changed to a FFM. He is here to learn about INSPIRE. Uses BiPAP. Has retrognathia.  A HST was performed in January same year and  the results are not found in epic.  He has a past medical history of Cancer ([place]), Diabetes mellitus without complication ([place]), Essential hypertension (03/06/2016), GERD (gastroesophageal reflux disease), Hyperlipidemia, and continued Sleep apnea after he had undergone turbinate reduction and uvulectomy.    The last sleep study was a CPAP titration performed on 20 July 2018 under the guidance of Dr. Ander Barr critical care pulmonology physician. The overall AHI was not stated !.   The most appropriate setting for the patient was CPAP at 17 cmH2O according to this note.  Sleep efficiency however at that setting was only 42%.  There were also frequent leg movements but few of them leading to arousals from sleep. " The process is to refer him to Dr. Redmond Barr who does the inspire device for evaluation-he has to be evaluated to see if he qualifies. -Moderate to severe obstructive sleep apnea -Intolerance to CPAP/BiPAP   I am okay with if he wants a copy of his sleep study result I am also okay with if he wants to see another provider        Electronically signed by Ronald Coder, MD at 06/25/2020  7:56 AM     Epworth sleepiness score: 10/24. BMI: 33.4/kg/m  Neck Circumference: 18"   FINDINGS:   Sleep Summary:    This home sleep test calculated a  full recording time of 6 hours and 58 minutes of which 5 hours and 36 minutes were considered total sleep time, REM sleep proportion was 14.6%.                                       Respiratory Indices:   The overall calculated AHI or apnea hypopnea index was 88.3/h which is the severe apnea . AHI in REM sleep 103.3/h and in NREM sleep 85.8/h.                       Supine AHI: Positional apnea-hypopnea index for supine sleep was 18 1.2/h and in nonsupine sleep 109.1/h.  Snoring level reached a mean volume of 42 dB and was present for 20% of the total sleep time.                                                 Oxygen Saturation Statistics:   O2 Saturation Range (%): Oxygen saturation varied between a nadir of 79% and a maximum of 99% with a mean saturation at 92%.  O2 Saturation (minutes) <89%:   27.4 minutes, the equivalent of 8.2% of total sleep time.        Pulse Rate Statistics:               Pulse Range: Varied between 45 to 130 bpm with a mean heart rate of 67 bpm.               IMPRESSION:  This HST confirms the presence of very severe sleep apnea independent of REM versus non-REM sleep and independent of sleep position.  Hypoxia was noted but was not sustained.   RECOMMENDATION: Severe degree of apnea likely this is usually treated with positive airway pressure therapy.  This result exceeds the AHI range of 15-40 recommended for treatment by inspire device.  Inspire Apnea therapy reduces an AHI on average by 72% , which would leave this patient still with significant apnea.     INTERPRETING PHYSICIAN:   Ronald Seat, MD   Medical Director of [facility] at [facility].

## 2021-02-26 NOTE — Telephone Encounter (Signed)
Called patient to discuss sleep study results. No answer at this time. LVM for the patient to call back.  Will also send a mychart message.  

## 2021-03-07 ENCOUNTER — Encounter: Payer: Self-pay | Admitting: Podiatry

## 2021-03-07 ENCOUNTER — Ambulatory Visit (INDEPENDENT_AMBULATORY_CARE_PROVIDER_SITE_OTHER): Payer: Medicare HMO | Admitting: Family Medicine

## 2021-03-07 ENCOUNTER — Encounter: Payer: Self-pay | Admitting: Family Medicine

## 2021-03-07 ENCOUNTER — Other Ambulatory Visit: Payer: Self-pay

## 2021-03-07 ENCOUNTER — Ambulatory Visit: Payer: Medicare HMO | Admitting: Podiatry

## 2021-03-07 VITALS — BP 138/72 | HR 84 | Temp 97.7°F | Ht >= 80 in | Wt 294.0 lb

## 2021-03-07 DIAGNOSIS — E1169 Type 2 diabetes mellitus with other specified complication: Secondary | ICD-10-CM

## 2021-03-07 DIAGNOSIS — I10 Essential (primary) hypertension: Secondary | ICD-10-CM | POA: Diagnosis not present

## 2021-03-07 DIAGNOSIS — E669 Obesity, unspecified: Secondary | ICD-10-CM | POA: Diagnosis not present

## 2021-03-07 DIAGNOSIS — Z23 Encounter for immunization: Secondary | ICD-10-CM | POA: Diagnosis not present

## 2021-03-07 DIAGNOSIS — M79675 Pain in left toe(s): Secondary | ICD-10-CM

## 2021-03-07 DIAGNOSIS — Q828 Other specified congenital malformations of skin: Secondary | ICD-10-CM | POA: Diagnosis not present

## 2021-03-07 DIAGNOSIS — M79674 Pain in right toe(s): Secondary | ICD-10-CM

## 2021-03-07 DIAGNOSIS — B351 Tinea unguium: Secondary | ICD-10-CM | POA: Diagnosis not present

## 2021-03-07 DIAGNOSIS — E1149 Type 2 diabetes mellitus with other diabetic neurological complication: Secondary | ICD-10-CM

## 2021-03-07 LAB — COMPREHENSIVE METABOLIC PANEL
ALT: 25 U/L (ref 0–53)
AST: 30 U/L (ref 0–37)
Albumin: 4.4 g/dL (ref 3.5–5.2)
Alkaline Phosphatase: 42 U/L (ref 39–117)
BUN: 20 mg/dL (ref 6–23)
CO2: 29 mEq/L (ref 19–32)
Calcium: 9.5 mg/dL (ref 8.4–10.5)
Chloride: 104 mEq/L (ref 96–112)
Creatinine, Ser: 1.39 mg/dL (ref 0.40–1.50)
GFR: 50.09 mL/min — ABNORMAL LOW (ref >60.00)
Glucose, Bld: 215 mg/dL — ABNORMAL HIGH (ref 70–99)
Potassium: 4.4 mEq/L (ref 3.5–5.1)
Sodium: 141 mEq/L (ref 135–145)
Total Bilirubin: 0.6 mg/dL (ref 0.2–1.2)
Total Protein: 7.1 g/dL (ref 6.0–8.3)

## 2021-03-07 LAB — LIPID PANEL
Cholesterol: 172 mg/dL (ref 0–200)
HDL: 40.5 mg/dL (ref >39.00)
LDL Cholesterol: 109 mg/dL — ABNORMAL HIGH (ref 0–99)
NonHDL: 131.75
Total CHOL/HDL Ratio: 4
Triglycerides: 115 mg/dL (ref 0.0–149.0)
VLDL: 23 mg/dL (ref 0.0–40.0)

## 2021-03-07 LAB — HEMOGLOBIN A1C: Hgb A1c MFr Bld: 7.9 % — ABNORMAL HIGH (ref 4.6–6.5)

## 2021-03-07 MED ORDER — DEXCOM G6 RECEIVER DEVI: 3 refills | Status: DC

## 2021-03-07 MED ORDER — DEXCOM G6 SENSOR MISC: 1 refills | Status: DC

## 2021-03-07 MED ORDER — DEXCOM G6 TRANSMITTER MISC
3 refills | Status: DC
Start: 2021-03-07 — End: 2021-03-12

## 2021-03-07 NOTE — Progress Notes (Signed)
Subjective: 75 y.o. returns the office today for painful calluses to his feet that he will have trimmed the right side worse than left and also for elongated toenails are causing discomfort mostly with shoes.  He has not seen any redness or swelling. Denies any open sores.  Denies any systemic complaints such as fevers, chills, nausea, vomiting.   PCP: Shelda Pal, DO last seen 09/03/2020 A1c: 8.1 on 12/04/2020  Objective: AAO 3, NAD DP/PT pulses palpable, CRT less than 3 seconds Hyperkeratotic lesions present bilateral submetatarsal one and 5.  No underlying ulceration, drainage or any signs of infection.  Upon debridement of right submetatarsal 5 lesion some dried blood was present there is no ulcerations identified or signs of infection. Nails are minimally hypertrophic, dystrophic, brittle, discolored, elongated 10. No surrounding redness or drainage. Tenderness nails 1-5 bilaterally.  No pain otherwise the left foot in particular. No open lesions or pre-ulcerative lesions are identified. No pain with calf compression, swelling, warmth, erythema.  Assessment: Patient presents with symptomatic hyperkeratotic lesions; symptomatic onychomycosis;  type 2 diabetes with neuropathy  Plan: -Treatment options including alternatives, risks, complications were discussed -Hyperkeratotic lesion sharply debrided x2 without any complications or bleeding -Nails debrided B63 with any complications or bleeding as a courtesy  -Discussed daily foot inspection. If there are any changes, to call the office immediately.  -Follow-up in 9 weeks or sooner if any problems are to arise. In the meantime, encouraged to call the office with any questions, concerns, changes symptoms.   Lorenda Peck, DPM

## 2021-03-07 NOTE — Addendum Note (Signed)
Addended by: Sharon Seller B on: 03/07/2021 01:19 PM   Modules accepted: Orders

## 2021-03-07 NOTE — Patient Instructions (Addendum)
Give [place] 2-3 business days to get the results of your labs back. Our follow up visit will be based on results.   If the glucose monitor is not covered, call your insurance company to see if there is any specific brand they do cover and let me know.   The new Shingrix vaccine (for shingles) is a 2 shot series. It can make people feel low energy, achy and almost like they have the flu for 48 hours after injection. Please plan accordingly when deciding on when to get this shot. Call your pharmacy to get this. The second shot of the series is less severe regarding the side effects, but it still lasts 48 hours.   Let us know if you need anything.

## 2021-03-07 NOTE — Progress Notes (Signed)
Subjective:   Chief Complaint  Patient presents with   Follow-up    3 month    Ronald Barr is a 75 y.o. male here for follow-up of diabetes.   Ronald Barr's self monitored glucose range is low 100's in AM, and mid 100's in evening.  Patient denies hypoglycemic reactions. He checks his glucose levels 2 time(s) per day. Patient does require insulin- Tresiba 41 u qhs, 17 u in AM.   Medications include: Glipizide 20 mg/d, metformin 850 mg TID, Actos 30 mg/d.  Diet is "terrible".   Exercise: none No CP or SOB.   Hypertension Patient presents for hypertension follow up. He does not monitor home blood pressures. He is compliant with medication- Norvasc 10 mg/d. Patient has these side effects of medication: none Diet/exercise as above.   Past Medical History:  Diagnosis Date   Cancer [facility])    prostate   Diabetes mellitus without complication ([place])    Essential hypertension 03/06/2016   GERD (gastroesophageal reflux disease)    Hyperlipidemia    Sleep apnea    sleeps with BPAP machine     Related testing: Retinal exam: Done Pneumovax: Due for PCV20  Objective:  BP 138/72   Pulse 84   Temp 97.7 F (36.5 C) (Oral)   Ht 6\' 8"  (2.032 m)   Wt 294 lb (133.4 kg)   SpO2 93%   BMI 32.30 kg/m  General:  Well developed, well nourished, in no apparent distress Skin:  Warm, no pallor or diaphoresis Lungs:  CTAB, no access msc use Cardio:  RRR, no bruits, 1+ pitting b/l LE edema tapering at mid tibia Psych: Age appropriate judgment and insight  Assessment:   Diabetes mellitus type 2 in obese ([place]) - Plan: Lipid panel, Hemoglobin A1c, Comprehensive metabolic panel  Need for influenza vaccination - Plan: Flu Vaccine QUAD High Dose(Fluad)   Plan:   Chronic, unstable. Last A1c was 8.1, goal is <8. Cont Tresiba 41 u qhs, 17 u in AM, Metformin 850 mg TID, Actos 30 mg/d, Glipizide 20 mg/d. Counseled on diet and exercise. PCV20 and flu shot today. Chronic, stable. Cont amlodipine 10  mg/d. Failed ACEi/ARB after renal failure.  Shingrix rec'd. F/u in 3-6 mo pending above. The patient voiced understanding and agreement to the plan.  [place], DO 03/07/21 10:15 AM

## 2021-03-07 NOTE — Addendum Note (Signed)
Addended by: Sharon Seller B on: 03/07/2021 10:27 AM   Modules accepted: Orders

## 2021-03-10 ENCOUNTER — Telehealth: Payer: Self-pay | Admitting: Family Medicine

## 2021-03-10 NOTE — Telephone Encounter (Signed)
Initiated PA today 03/10/2021 for Dexcom6 Sensor-KEY:[id number] Transmitter -- [id number] Receiver -- NTI:[id number] Waiting response from [employer].

## 2021-03-11 NOTE — Telephone Encounter (Signed)
Received denial for all request.

## 2021-03-11 NOTE — Telephone Encounter (Signed)
Received denial

## 2021-03-12 NOTE — Telephone Encounter (Signed)
Called the patient left message to call back 

## 2021-03-12 NOTE — Telephone Encounter (Signed)
Patient informed. 

## 2021-03-18 ENCOUNTER — Other Ambulatory Visit: Payer: Self-pay | Admitting: Family Medicine

## 2021-03-23 ENCOUNTER — Other Ambulatory Visit: Payer: Self-pay | Admitting: Family Medicine

## 2021-03-24 ENCOUNTER — Other Ambulatory Visit: Payer: Self-pay

## 2021-03-24 MED ORDER — DEXCOM G6 SENSOR MISC: 1 refills | Status: DC

## 2021-03-24 MED ORDER — DEXCOM G6 TRANSMITTER MISC: 1 refills | Status: DC

## 2021-03-24 MED ORDER — DEXCOM G6 RECEIVER DEVI: 3 refills | Status: DC

## 2021-04-01 ENCOUNTER — Ambulatory Visit (INDEPENDENT_AMBULATORY_CARE_PROVIDER_SITE_OTHER): Payer: Medicare HMO

## 2021-04-01 ENCOUNTER — Other Ambulatory Visit: Payer: Self-pay

## 2021-04-01 VITALS — BP 138/80 | HR 81 | Temp 98.0°F | Resp 16 | Ht >= 80 in | Wt 296.2 lb

## 2021-04-01 DIAGNOSIS — Z0000 Encounter for general adult medical examination without abnormal findings: Secondary | ICD-10-CM | POA: Diagnosis not present

## 2021-04-01 NOTE — Patient Instructions (Signed)
[name] , Thank you for taking time to come for your Medicare Wellness Visit. I appreciate your ongoing commitment to your health goals. Please review the following plan we discussed and let me know if I can assist you in the future.   Screening recommendations/referrals: Colonoscopy: Completed 05/2013-Due 05/2023 Recommended yearly ophthalmology/optometry visit for glaucoma screening and checkup Recommended yearly dental visit for hygiene and checkup  Vaccinations: Influenza vaccine: p to date Pneumococcal vaccine: Up to date Tdap vaccine: Up to date Shingles vaccine: Discuss with pharmacy   Covid-19: Booster available at the pharmacy  Advanced directives: Please bring a copy of Living Will and/or Healthcare Power of Attorney for your chart.   Conditions/risks identified: See problem list  Next appointment: Follow up in one year for your annual wellness visit. 04/06/2022 @ 9:40  Preventive Care 74 Years and Older, Male Preventive care refers to lifestyle choices and visits with your health care provider that can promote health and wellness. What does preventive care include? A yearly physical exam. This is also called an annual well check. Dental exams once or twice a year. Routine eye exams. Ask your health care provider how often you should have your eyes checked. Personal lifestyle choices, including: Daily care of your teeth and gums. Regular physical activity. Eating a healthy diet. Avoiding tobacco and drug use. Limiting alcohol use. Practicing safe sex. Taking low doses of aspirin every day. Taking vitamin and mineral supplements as recommended by your health care provider. What happens during an annual well check? The services and screenings done by your health care provider during your annual well check will depend on your age, overall health, lifestyle risk factors, and family history of disease. Counseling  Your health care provider may ask you questions about  your: Alcohol use. Tobacco use. Drug use. Emotional well-being. Home and relationship well-being. Sexual activity. Eating habits. History of falls. Memory and ability to understand (cognition). Work and work Statistician. Screening  You may have the following tests or measurements: Height, weight, and BMI. Blood pressure. Lipid and cholesterol levels. These may be checked every 5 years, or more frequently if you are over 66 years old. Skin check. Lung cancer screening. You may have this screening every year starting at age 38 if you have a 30-pack-year history of smoking and currently smoke or have quit within the past 15 years. Fecal occult blood test (FOBT) of the stool. You may have this test every year starting at age 74. Flexible sigmoidoscopy or colonoscopy. You may have a sigmoidoscopy every 5 years or a colonoscopy every 10 years starting at age 56. Prostate cancer screening. Recommendations will vary depending on your family history and other risks. Hepatitis C blood test. Hepatitis B blood test. Sexually transmitted disease (STD) testing. Diabetes screening. This is done by checking your blood sugar (glucose) after you have not eaten for a while (fasting). You may have this done every 1-3 years. Abdominal aortic aneurysm (AAA) screening. You may need this if you are a current or former smoker. Osteoporosis. You may be screened starting at age 74 if you are at high risk. Talk with your health care provider about your test results, treatment options, and if necessary, the need for more tests. Vaccines  Your health care provider may recommend certain vaccines, such as: Influenza vaccine. This is recommended every year. Tetanus, diphtheria, and acellular pertussis (Tdap, Td) vaccine. You may need a Td booster every 10 years. Zoster vaccine. You may need this after age 74. Pneumococcal 13-valent  conjugate (PCV13) vaccine. One dose is recommended after age 74. Pneumococcal  polysaccharide (PPSV23) vaccine. One dose is recommended after age 74. Talk to your health care provider about which screenings and vaccines you need and how often you need them. This information is not intended to replace advice given to you by your health care provider. Make sure you discuss any questions you have with your health care provider. Document Released: 05/24/2015 Document Revised: 01/15/2016 Document Reviewed: 02/26/2015 Elsevier Interactive Patient Education  2017 [place] Prevention in the Home Falls can cause injuries. They can happen to people of all ages. There are many things you can do to make your home safe and to help prevent falls. What can I do on the outside of my home? Regularly fix the edges of walkways and driveways and fix any cracks. Remove anything that might make you trip as you walk through a door, such as a raised step or threshold. Trim any bushes or trees on the path to your home. Use bright outdoor lighting. Clear any walking paths of anything that might make someone trip, such as rocks or tools. Regularly check to see if handrails are loose or broken. Make sure that both sides of any steps have handrails. Any raised decks and porches should have guardrails on the edges. Have any leaves, snow, or ice cleared regularly. Use sand or salt on walking paths during winter. Clean up any spills in your garage right away. This includes oil or grease spills. What can I do in the bathroom? Use night lights. Install grab bars by the toilet and in the tub and shower. Do not use towel bars as grab bars. Use non-skid mats or decals in the tub or shower. If you need to sit down in the shower, use a plastic, non-slip stool. Keep the floor dry. Clean up any water that spills on the floor as soon as it happens. Remove soap buildup in the tub or shower regularly. Attach bath mats securely with double-sided non-slip rug tape. Do not have throw rugs and other  things on the floor that can make you trip. What can I do in the bedroom? Use night lights. Make sure that you have a light by your bed that is easy to reach. Do not use any sheets or blankets that are too big for your bed. They should not hang down onto the floor. Have a firm chair that has side arms. You can use this for support while you get dressed. Do not have throw rugs and other things on the floor that can make you trip. What can I do in the kitchen? Clean up any spills right away. Avoid walking on wet floors. Keep items that you use a lot in easy-to-reach places. If you need to reach something above you, use a strong step stool that has a grab bar. Keep electrical cords out of the way. Do not use floor polish or wax that makes floors slippery. If you must use wax, use non-skid floor wax. Do not have throw rugs and other things on the floor that can make you trip. What can I do with my stairs? Do not leave any items on the stairs. Make sure that there are handrails on both sides of the stairs and use them. Fix handrails that are broken or loose. Make sure that handrails are as long as the stairways. Check any carpeting to make sure that it is firmly attached to the stairs. Fix any carpet that  is loose or worn. Avoid having throw rugs at the top or bottom of the stairs. If you do have throw rugs, attach them to the floor with carpet tape. Make sure that you have a light switch at the top of the stairs and the bottom of the stairs. If you do not have them, ask someone to add them for you. What else can I do to help prevent falls? Wear shoes that: Do not have high heels. Have rubber bottoms. Are comfortable and fit you well. Are closed at the toe. Do not wear sandals. If you use a stepladder: Make sure that it is fully opened. Do not climb a closed stepladder. Make sure that both sides of the stepladder are locked into place. Ask someone to hold it for you, if possible. Clearly  mark and make sure that you can see: Any grab bars or handrails. First and last steps. Where the edge of each step is. Use tools that help you move around (mobility aids) if they are needed. These include: Canes. Walkers. Scooters. Crutches. Turn on the lights when you go into a dark area. Replace any light bulbs as soon as they burn out. Set up your furniture so you have a clear path. Avoid moving your furniture around. If any of your floors are uneven, fix them. If there are any pets around you, be aware of where they are. Review your medicines with your doctor. Some medicines can make you feel dizzy. This can increase your chance of falling. Ask your doctor what other things that you can do to help prevent falls. This information is not intended to replace advice given to you by your health care provider. Make sure you discuss any questions you have with your health care provider. Document Released: 02/21/2009 Document Revised: 10/03/2015 Document Reviewed: 06/01/2014 Elsevier Interactive Patient Education  2017 [employer].

## 2021-04-01 NOTE — Progress Notes (Signed)
Subjective:   Ronald Barr is a 75 y.o. male who presents for an Initial Medicare Annual Wellness Visit.  Review of Systems     Cardiac Risk Factors include: advanced age (>67men, >35 women);male gender;hypertension;diabetes mellitus;dyslipidemia;obesity (BMI >30kg/m2);sedentary lifestyle     Objective:    Today's Vitals   04/01/21 0910  BP: 138/80  Pulse: 81  Resp: 16  Temp: 98 F (36.7 C)  TempSrc: Temporal  SpO2: 96%  Weight: 296 lb 3.2 oz (134.4 kg)  Height: 6\' 8"  (2.032 m)   Body mass index is 32.54 kg/m.  Advanced Directives 04/01/2021 07/20/2018 04/26/2018 03/10/2018 05/31/2014  Does Patient Have a Medical Advance Directive? Yes Yes Yes No Yes  Type of Advance Directive Living will Living will - - [place];Living will  Does patient want to make changes to medical advance directive? - No - Patient declined No - Patient declined - -  Copy of [place] in Chart? - - - - No - copy requested  Would patient like information on creating a medical advance directive? - - - No - Patient declined -    Current Medications (verified) Outpatient Encounter Medications as of 04/01/2021  Medication Sig   amLODipine (NORVASC) 10 MG tablet TAKE ONE TABLET BY MOUTH DAILY   cyclobenzaprine (FLEXERIL) 10 MG tablet TAKE 1 TABLET BY MOUTH AT BEDTIME   fenofibrate (TRICOR) 145 MG tablet TAKE ONE TABLET BY MOUTH DAILY   glipiZIDE (GLUCOTROL XL) 10 MG 24 hr tablet TAKE TWO TABLETS BY MOUTH DAILY   glucose blood (ACCU-CHEK GUIDE) test strip Use daily to check blood sugar.  DXE11.9   insulin degludec (TRESIBA FLEXTOUCH) 100 UNIT/ML FlexTouch Pen Inject 41 units at bedtime and 17 units in the morning.   MAGNESIUM PO Take 1 tablet by mouth 2 (two) times daily at 10 AM and 5 PM.   metFORMIN (GLUCOPHAGE) 850 MG tablet TAKE 1 TABLET BY MOUTH WITH BREAKFAST, WITH LUNCH AND WITH EVENING MEAL   Multiple Vitamins-Minerals (MULTIVITAMIN PO) Take 1 tablet by  mouth daily.   NOVOFINE PEN NEEDLE 32G X 6 MM MISC USE WITH TRESIBA 2 TIMES A DAY   omeprazole (PRILOSEC) 20 MG capsule Take 1 capsule (20 mg total) by mouth daily.   pioglitazone (ACTOS) 30 MG tablet TAKE ONE TABLET BY MOUTH DAILY   pravastatin (PRAVACHOL) 10 MG tablet TAKE ONE TABLET BY MOUTH DAILY   Continuous Blood Gluc Receiver (DEXCOM G6 RECEIVER) DEVI Use daily to check blood sugar.  DX E11.9 (Patient not taking: Reported on 04/01/2021)   Continuous Blood Gluc Sensor (DEXCOM G6 SENSOR) MISC Use daily to check blood sugar.  DX E11.9 (Patient not taking: Reported on 04/01/2021)   Continuous Blood Gluc Transmit (DEXCOM G6 TRANSMITTER) MISC Use daily to check blood sugar.  DXE11.9 (Patient not taking: Reported on 04/01/2021)   IRON PO Take 1 tablet by mouth daily. (Patient not taking: Reported on 04/01/2021)   No facility-administered encounter medications on file as of 04/01/2021.    Allergies (verified) Statins and Sulfa antibiotics   History: Past Medical History:  Diagnosis Date   Cancer ([place])    prostate   Diabetes mellitus without complication ([place])    Essential hypertension 03/06/2016   GERD (gastroesophageal reflux disease)    Hyperlipidemia    Sleep apnea    sleeps with BPAP machine   Past Surgical History:  Procedure Laterality Date   nose and throat surgery     sleep apnes   SHOULDER  SURGERY     TONSILLECTOMY     Family History  Problem Relation Age of Onset   Hypertension Mother    Hypertension Father    Social History   Socioeconomic History   Marital status: Married    Spouse name: Ronald Barr   Number of children: 0   Years of education: Not on file   Highest education level: Master's degree (e.g., MA, MS, MEng, MEd, MSW, MBA)  Occupational History   Not on file  Tobacco Use   Smoking status: Former    Types: Cigarettes    Quit date: 06/01/1979    Years since quitting: 41.8   Smokeless tobacco: Never  Substance and Sexual Activity   Alcohol use:  Not Currently   Drug use: No   Sexual activity: Not on file  Other Topics Concern   Not on file  Social History Narrative   Lives with wife   Right handed   Caffeine: 1 cup of coffee and I soda can a day   Social Determinants of Health   Financial Resource Strain: Low Risk    Difficulty of Paying Living Expenses: Not hard at all  Food Insecurity: No Food Insecurity   Worried About Charity fundraiser in the Last Year: Never true   Ran Out of Food in the Last Year: Never true  Transportation Needs: No Transportation Needs   Lack of Transportation (Medical): No   Lack of Transportation (Non-Medical): No  Physical Activity: Inactive   Days of Exercise per Week: 0 days   Minutes of Exercise per Session: 0 min  Stress: No Stress Concern Present   Feeling of Stress : Not at all  Social Connections: Moderately Isolated   Frequency of Communication with Friends and Family: More than three times a week   Frequency of Social Gatherings with Friends and Family: More than three times a week   Attends Religious Services: Never   Marine scientist or Organizations: No   Attends Music therapist: Never   Marital Status: Married    Tobacco Counseling Counseling given: Not Answered   Clinical Intake:  Pre-visit preparation completed: Yes  Pain : No/denies pain     BMI - recorded: 32.54 Nutritional Status: BMI > 30  Obese Nutritional Risks: None Diabetes: Yes CBG done?: No Did pt. bring in CBG monitor from home?: No  How often do you need to have someone help you when you read instructions, pamphlets, or other written materials from your doctor or pharmacy?: 1 - Never  Diabetes:  Is the patient diabetic?  Yes  If diabetic, was a CBG obtained today?  No  Did the patient bring in their glucometer from home?  No  How often do you monitor your CBG's? Twice daily.   Financial Strains and Diabetes Management:  Are you having any financial strains with the  device, your supplies or your medication? No .  Does the patient want to be seen by Chronic Care Management for management of their diabetes?  No  Would the patient like to be referred to a Nutritionist or for Diabetic Management?  No   Diabetic Exams:  Diabetic Eye Exam: Completed 04/03/2020.   Diabetic Foot Exam: Completed 08/14/2020.   Interpreter Needed?: No  Information entered by :: Caroleen Hamman LPn   Activities of Daily Living In your present state of health, do you have any difficulty performing the following activities: 04/01/2021 09/03/2020  Hearing? N N  Vision? N N  Difficulty concentrating  or making decisions? N N  Walking or climbing stairs? N N  Dressing or bathing? N N  Doing errands, shopping? N N  Preparing Food and eating ? N -  Using the Toilet? N -  In the past six months, have you accidently leaked urine? N -  Do you have problems with loss of bowel control? N -  Managing your Medications? N -  Managing your Finances? N -  Housekeeping or managing your Housekeeping? N -  Some recent data might be hidden    Patient Care Team: Shelda Pal, DO as PCP - General (Family Medicine)  Indicate any recent Medical Services you may have received from other than [facility] providers in the past year (date may be approximate).     Assessment:   This is a routine wellness examination for Ronald Barr.  Hearing/Vision screen Hearing Screening - Comments:: No issues Vision Screening - Comments:: Last eye exam-03/2020-[facility]  Dietary issues and exercise activities discussed: Current Exercise Habits: The patient does not participate in regular exercise at present, Exercise limited by: orthopedic condition(s)   Goals Addressed             This Visit's Progress    Patient Stated       Maintain current health       Depression Screen PHQ 2/9 Scores 04/01/2021 09/03/2020 03/06/2016  PHQ - 2 Score 0 0 0    Fall Risk Fall Risk  04/01/2021 03/06/2016   Falls in the past year? 0 No  Number falls in past yr: 0 -  Injury with Fall? 0 -  Follow up Falls prevention discussed -    FALL RISK PREVENTION PERTAINING TO THE HOME:  Any stairs in or around the home? No  Home free of loose throw rugs in walkways, pet beds, electrical cords, etc? Yes  Adequate lighting in your home to reduce risk of falls? Yes   ASSISTIVE DEVICES UTILIZED TO PREVENT FALLS:  Life alert? No  Use of a cane, walker or w/c? No  Grab bars in the bathroom? Yes  Shower chair or bench in shower? Yes  Elevated toilet seat or a handicapped toilet? No   TIMED UP AND GO:  Was the test performed? Yes .  Length of time to ambulate 10 feet: 10 sec.   Gait steady and fast without use of assistive device  Cognitive Function:Normal cognitive status assessed by direct observation by this Nurse Health Advisor. No abnormalities found.     Montreal Cognitive Assessment  03/27/2020  Visuospatial/ Executive (0/5) 5  Naming (0/3) 3  Attention: Read list of digits (0/2) 2  Attention: Read list of letters (0/1) 1  Attention: Serial 7 subtraction starting at 100 (0/3) 3  Language: Repeat phrase (0/2) 1  Language : Fluency (0/1) 1  Abstraction (0/2) 2  Delayed Recall (0/5) 0  Orientation (0/6) 6  Total 24  Adjusted Score (based on education) 24      Immunizations Immunization History  Administered Date(s) Administered   Fluad Quad(high Dose 65+) 02/02/2019, 03/05/2020, 03/07/2021   Influenza, High Dose Seasonal PF 03/06/2016, 03/11/2017, 02/24/2018   PFIZER(Purple Top)SARS-COV-2 Vaccination 06/24/2019, 07/17/2019, 03/09/2020   PNEUMOCOCCAL CONJUGATE-20 03/07/2021   Pneumococcal Conjugate-13 02/08/2014   Pneumococcal Polysaccharide-23 03/07/2015   Pneumococcal-Unspecified 03/07/2015   Td 03/06/2016    TDAP status: Up to date  Flu Vaccine status: Up to date  Pneumococcal vaccine status: Up to date  Covid-19 vaccine status: Information provided on how to obtain  vaccines.  Qualifies for Shingles Vaccine? Yes   Zostavax completed No   Shingrix Completed?: No.    Education has been provided regarding the importance of this vaccine. Patient has been advised to call insurance company to determine out of pocket expense if they have not yet received this vaccine. Advised may also receive vaccine at local pharmacy or Health Dept. Verbalized acceptance and understanding.  Screening Tests Health Maintenance  Topic Date Due   Zoster Vaccines- Shingrix (1 of 2) Never done   COVID-19 Vaccine (4 - Booster for [employer] series) 04/06/2021 (Originally 05/04/2020)   OPHTHALMOLOGY EXAM  04/03/2021   FOOT EXAM  09/03/2021   URINE MICROALBUMIN  09/03/2021   HEMOGLOBIN A1C  09/05/2021   COLONOSCOPY (Pts 45-74yrs Insurance coverage will need to be confirmed)  05/12/2023   TETANUS/TDAP  03/06/2026   Pneumonia Vaccine 58+ Years old  Completed   INFLUENZA VACCINE  Completed   Hepatitis C Screening  Completed   HPV VACCINES  Aged Out    Health Maintenance  Health Maintenance Due  Topic Date Due   Zoster Vaccines- Shingrix (1 of 2) Never done    Colorectal cancer screening: Type of screening: Colonoscopy. Completed 05/11/2013. Repeat every 10 years  Lung Cancer Screening: (Low Dose CT Chest recommended if Age 74-80 years, 30 pack-year currently smoking OR have quit w/in 15years.) does not qualify.     Additional Screening:  Hepatitis C Screening: Completed 03/06/2016  Vision Screening: Recommended annual ophthalmology exams for early detection of glaucoma and other disorders of the eye. Is the patient up to date with their annual eye exam?  Yes  Who is the provider or what is the name of the office in which the patient attends annual eye exams? [facility]   Dental Screening: Recommended annual dental exams for proper oral hygiene  Community Resource Referral / Chronic Care Management: CRR required this visit?  No   CCM required this visit?  No       Plan:     I have personally reviewed and noted the following in the patient's chart:   Medical and social history Use of alcohol, tobacco or illicit drugs  Current medications and supplements including opioid prescriptions. Patient is not currently taking opioid prescriptions. Functional ability and status Nutritional status Physical activity Advanced directives List of other physicians Hospitalizations, surgeries, and ER visits in previous 12 months Vitals Screenings to include cognitive, depression, and falls Referrals and appointments  In addition, I have reviewed and discussed with patient certain preventive protocols, quality metrics, and best practice recommendations. A written personalized care plan for preventive services as well as general preventive health recommendations were provided to patient.   Patient to access avs on mychart  Marta Antu, LPN   09/38/1829  Nurse health Advisor  Nurse Notes: None

## 2021-04-15 ENCOUNTER — Other Ambulatory Visit: Payer: Self-pay | Admitting: Family Medicine

## 2021-04-15 DIAGNOSIS — E119 Type 2 diabetes mellitus without complications: Secondary | ICD-10-CM

## 2021-04-16 ENCOUNTER — Encounter: Payer: Self-pay | Admitting: Family Medicine

## 2021-04-16 MED ORDER — CYCLOBENZAPRINE HCL 10 MG PO TABS: 10.0000 mg | ORAL_TABLET | Freq: Every day | ORAL | 2 refills | Status: DC

## 2021-04-17 ENCOUNTER — Encounter: Payer: Self-pay | Admitting: Family Medicine

## 2021-05-09 ENCOUNTER — Other Ambulatory Visit: Payer: Self-pay

## 2021-05-09 ENCOUNTER — Ambulatory Visit: Payer: Medicare HMO | Admitting: Podiatry

## 2021-05-09 ENCOUNTER — Encounter: Payer: Self-pay | Admitting: Podiatry

## 2021-05-09 DIAGNOSIS — Q828 Other specified congenital malformations of skin: Secondary | ICD-10-CM | POA: Diagnosis not present

## 2021-05-09 DIAGNOSIS — E1149 Type 2 diabetes mellitus with other diabetic neurological complication: Secondary | ICD-10-CM | POA: Diagnosis not present

## 2021-05-09 DIAGNOSIS — B351 Tinea unguium: Secondary | ICD-10-CM

## 2021-05-09 NOTE — Progress Notes (Signed)
Subjective: 75 y.o. returns the office today for painful calluses to his feet that he will have trimmed the right side worse than left.  He has not seen any redness or swelling. Denies any open sores.  Denies any systemic complaints such as fevers, chills, nausea, vomiting.   PCP: Shelda Pal, DO last seen 03/07/21 A1c: 7.9 on 03/07/21  Objective: AAO 3, NAD DP/PT pulses palpable, CRT less than 3 seconds Hyperkeratotic lesions present bilateral submetatarsal one and 5.  No underlying ulceration, drainage or any signs of infection.  Upon debridement of right submetatarsal 5 lesion some dried blood was present there is no ulcerations identified or signs of infection. Nails are minimally hypertrophic, dystrophic, brittle, discolored, elongated 10. No surrounding redness or drainage. Tenderness nails 1-5 bilaterally.  No pain otherwise the left foot in particular. No open lesions or pre-ulcerative lesions are identified. No pain with calf compression, swelling, warmth, erythema.  Assessment: Patient presents with symptomatic hyperkeratotic lesions;  type 2 diabetes with neuropathy  Plan: -Treatment options including alternatives, risks, complications were discussed -Hyperkeratotic lesion sharply debrided x2 without any complications or bleeding -Discussed daily foot inspection. If there are any changes, to call the office immediately.  -Follow-up in 9 weeks or sooner if any problems are to arise. In the meantime, encouraged to call the office with any questions, concerns, changes symptoms.   Lorenda Peck, DPM

## 2021-05-10 ENCOUNTER — Other Ambulatory Visit: Payer: Self-pay | Admitting: Family Medicine

## 2021-05-11 ENCOUNTER — Other Ambulatory Visit: Payer: Self-pay | Admitting: Family Medicine

## 2021-05-27 ENCOUNTER — Encounter: Payer: Self-pay | Admitting: Family Medicine

## 2021-05-27 ENCOUNTER — Ambulatory Visit (INDEPENDENT_AMBULATORY_CARE_PROVIDER_SITE_OTHER): Payer: Medicare HMO | Admitting: Family Medicine

## 2021-05-27 ENCOUNTER — Other Ambulatory Visit: Payer: Self-pay | Admitting: Family Medicine

## 2021-05-27 VITALS — BP 142/82 | HR 91 | Temp 97.8°F | Ht >= 80 in | Wt 291.4 lb

## 2021-05-27 DIAGNOSIS — J189 Pneumonia, unspecified organism: Secondary | ICD-10-CM | POA: Diagnosis not present

## 2021-05-27 DIAGNOSIS — E1149 Type 2 diabetes mellitus with other diabetic neurological complication: Secondary | ICD-10-CM | POA: Diagnosis not present

## 2021-05-27 DIAGNOSIS — E119 Type 2 diabetes mellitus without complications: Secondary | ICD-10-CM

## 2021-05-27 DIAGNOSIS — K219 Gastro-esophageal reflux disease without esophagitis: Secondary | ICD-10-CM

## 2021-05-27 MED ORDER — BENZONATATE 100 MG PO CAPS: 100.0000 mg | ORAL_CAPSULE | Freq: Three times a day (TID) | ORAL | 0 refills | Status: DC | PRN

## 2021-05-27 MED ORDER — AZITHROMYCIN 250 MG PO TABS: ORAL_TABLET | ORAL | 0 refills | Status: DC

## 2021-05-27 MED ORDER — PROMETHAZINE-DM 6.25-15 MG/5ML PO SYRP: 5.0000 mL | ORAL_SOLUTION | Freq: Every evening | ORAL | 0 refills | Status: DC | PRN

## 2021-05-27 NOTE — Patient Instructions (Addendum)
Continue to push fluids, practice good hand hygiene, and cover your mouth if you cough.  If you start having fevers, shaking or shortness of breath, seek immediate care.  The syrup can make you drowsy, please only use at night.   Let us know if you need anything.

## 2021-05-27 NOTE — Progress Notes (Signed)
Chief Complaint  Patient presents with   Cough    2 weeks    Su Ley here for URI complaints.  Duration: 2 weeks  Associated symptoms: wheezing, shortness of breath, and coughing Denies: sinus congestion, sinus pain, rhinorrhea, itchy watery eyes, ear pain, ear drainage, sore throat, myalgia, and fevers, N/D/V, loss of taste/smell Treatment to date: Robitussin DM Sick contacts: No Tested neg for covid.   Past Medical History:  Diagnosis Date   Cancer [facility])    prostate   Diabetes mellitus without complication ([place])    Essential hypertension 03/06/2016   GERD (gastroesophageal reflux disease)    Hyperlipidemia    Sleep apnea    sleeps with BPAP machine    Objective BP (!) 142/82    Pulse 91    Temp 97.8 F (36.6 C) (Oral)    Ht 6\' 8"  (2.032 m)    Wt 291 lb 6 oz (132.2 kg)    SpO2 95%    BMI 32.01 kg/m  General: Awake, alert, appears stated age HEENT: AT, [place], ears patent b/l and TM's neg, nares patent w/o discharge, pharynx pink and without exudates, MMM Neck: No masses or asymmetry Heart: RRR Lungs: Crackles heard at both bases, worse on the right, no wheezing, no accessory muscle use Psych: Age appropriate judgment and insight, normal mood and affect  Walking pneumonia - Plan: azithromycin (ZITHROMAX) 250 MG tablet, promethazine-dextromethorphan (PROMETHAZINE-DM) 6.25-15 MG/5ML syrup, DISCONTINUED: benzonatate (TESSALON) 100 MG capsule  Type II diabetes mellitus with neurological manifestations ([place]), Chronic  Z-Pak, nighttime syrup, Tessalon Perles as needed.  Warnings about syrup verbalized. Continue to push fluids, practice good hand hygiene, cover mouth when coughing. F/u prn. If starting to experience worsening symptoms, shaking, or shortness of breath, seek immediate care. Pt voiced understanding and agreement to the plan.  [place], DO 05/27/21 11:47 AM

## 2021-05-29 ENCOUNTER — Encounter: Payer: Self-pay | Admitting: Family Medicine

## 2021-05-30 ENCOUNTER — Other Ambulatory Visit: Payer: Self-pay | Admitting: Internal Medicine

## 2021-05-30 MED ORDER — HYDROCODONE BIT-HOMATROP MBR 5-1.5 MG/5ML PO SOLN: 5.0000 mL | Freq: Two times a day (BID) | ORAL | 0 refills | Status: DC | PRN

## 2021-06-05 ENCOUNTER — Encounter: Payer: Self-pay | Admitting: Family Medicine

## 2021-06-05 MED ORDER — TRESIBA FLEXTOUCH 100 UNIT/ML ~~LOC~~ SOPN: PEN_INJECTOR | SUBCUTANEOUS | 3 refills | Status: DC

## 2021-06-23 ENCOUNTER — Other Ambulatory Visit: Payer: Self-pay | Admitting: Family Medicine

## 2021-06-26 ENCOUNTER — Other Ambulatory Visit: Payer: Self-pay | Admitting: Family Medicine

## 2021-06-27 MED ORDER — DROPLET PEN NEEDLES 32G X 6 MM MISC: 0 refills | Status: DC

## 2021-07-11 ENCOUNTER — Encounter: Payer: Self-pay | Admitting: Podiatry

## 2021-07-11 ENCOUNTER — Other Ambulatory Visit: Payer: Self-pay

## 2021-07-11 ENCOUNTER — Ambulatory Visit: Payer: Medicare HMO | Admitting: Podiatry

## 2021-07-11 DIAGNOSIS — E1149 Type 2 diabetes mellitus with other diabetic neurological complication: Secondary | ICD-10-CM | POA: Diagnosis not present

## 2021-07-11 DIAGNOSIS — L84 Corns and callosities: Secondary | ICD-10-CM

## 2021-07-11 NOTE — Progress Notes (Signed)
Subjective: ?76 y.o. returns the office today for painful calluses to his feet that he will have trimmed the right side worse than left.  He has not seen any redness or swelling. Denies any open sores.  Denies any systemic complaints such as fevers, chills, nausea, vomiting.  ? ?PCP: Shelda Pal, DO last seen 05/27/21 ?A1c: 7.9 on 03/07/21 ? ?Objective: ?AAO ?3, NAD ?DP/PT pulses palpable, CRT less than 3 seconds ?Hyperkeratotic lesions present bilateral submetatarsal one and 5.  No underlying ulceration, drainage or any signs of infection.  Upon debridement of right submetatarsal 5 lesion some dried blood was present there is no ulcerations identified or signs of infection. ?Nails are minimally hypertrophic, dystrophic, brittle, discolored, elongated ?10. No surrounding redness or drainage. Tenderness nails 1-5 bilaterally.  ?No pain otherwise the left foot in particular. ?No open lesions or pre-ulcerative lesions are identified. ?No pain with calf compression, swelling, warmth, erythema. ? ?Assessment: ?Patient presents with symptomatic hyperkeratotic lesions;  type 2 diabetes with neuropathy ? ?Plan: ?-Treatment options including alternatives, risks, complications were discussed ?-Hyperkeratotic lesion sharply debrided x2 without any complications or bleeding ?-Discussed daily foot inspection. If there are any changes, to call the office immediately.  ?-Follow-up in 9 weeks or sooner if any problems are to arise. In the meantime, encouraged to call the office with any questions, concerns, changes symptoms. ? ? ?Lorenda Peck, DPM ? ?

## 2021-07-17 DIAGNOSIS — E119 Type 2 diabetes mellitus without complications: Secondary | ICD-10-CM | POA: Diagnosis not present

## 2021-07-17 LAB — HM DIABETES EYE EXAM

## 2021-07-23 ENCOUNTER — Encounter: Payer: Self-pay | Admitting: Family Medicine

## 2021-07-23 ENCOUNTER — Other Ambulatory Visit: Payer: Self-pay | Admitting: Family Medicine

## 2021-07-23 MED ORDER — ACCU-CHEK SOFTCLIX LANCETS MISC: 12 refills | Status: DC

## 2021-07-28 ENCOUNTER — Encounter: Payer: Self-pay | Admitting: Family Medicine

## 2021-07-28 DIAGNOSIS — H25043 Posterior subcapsular polar age-related cataract, bilateral: Secondary | ICD-10-CM | POA: Diagnosis not present

## 2021-07-28 DIAGNOSIS — H25013 Cortical age-related cataract, bilateral: Secondary | ICD-10-CM | POA: Diagnosis not present

## 2021-07-28 DIAGNOSIS — H2512 Age-related nuclear cataract, left eye: Secondary | ICD-10-CM | POA: Diagnosis not present

## 2021-07-28 DIAGNOSIS — H2513 Age-related nuclear cataract, bilateral: Secondary | ICD-10-CM | POA: Diagnosis not present

## 2021-07-28 DIAGNOSIS — I10 Essential (primary) hypertension: Secondary | ICD-10-CM | POA: Diagnosis not present

## 2021-07-31 ENCOUNTER — Other Ambulatory Visit: Payer: Self-pay | Admitting: Family Medicine

## 2021-08-12 ENCOUNTER — Other Ambulatory Visit: Payer: Self-pay | Admitting: Family Medicine

## 2021-09-07 ENCOUNTER — Other Ambulatory Visit: Payer: Self-pay | Admitting: Family Medicine

## 2021-09-08 ENCOUNTER — Ambulatory Visit (INDEPENDENT_AMBULATORY_CARE_PROVIDER_SITE_OTHER): Payer: Medicare HMO | Admitting: Family Medicine

## 2021-09-08 ENCOUNTER — Encounter: Payer: Self-pay | Admitting: Family Medicine

## 2021-09-08 VITALS — BP 138/80 | HR 74 | Temp 97.5°F | Ht >= 80 in | Wt 293.1 lb

## 2021-09-08 DIAGNOSIS — Z0000 Encounter for general adult medical examination without abnormal findings: Secondary | ICD-10-CM

## 2021-09-08 DIAGNOSIS — E669 Obesity, unspecified: Secondary | ICD-10-CM

## 2021-09-08 DIAGNOSIS — E1169 Type 2 diabetes mellitus with other specified complication: Secondary | ICD-10-CM

## 2021-09-08 LAB — CBC
HCT: 43 % (ref 39.0–52.0)
Hemoglobin: 14.1 g/dL (ref 13.0–17.0)
MCHC: 32.8 g/dL (ref 30.0–36.0)
MCV: 90.5 fl (ref 78.0–100.0)
Platelets: 202 10*3/uL (ref 150.0–400.0)
RBC: 4.75 Mil/uL (ref 4.22–5.81)
RDW: 15.4 % (ref 11.5–15.5)
WBC: 5.1 10*3/uL (ref 4.0–10.5)

## 2021-09-08 LAB — LIPID PANEL
Cholesterol: 171 mg/dL (ref 0–200)
HDL: 40.6 mg/dL (ref >39.00)
LDL Cholesterol: 107 mg/dL — ABNORMAL HIGH (ref 0–99)
NonHDL: 130.61
Total CHOL/HDL Ratio: 4
Triglycerides: 116 mg/dL (ref 0.0–149.0)
VLDL: 23.2 mg/dL (ref 0.0–40.0)

## 2021-09-08 LAB — COMPREHENSIVE METABOLIC PANEL
ALT: 21 U/L (ref 0–53)
AST: 26 U/L (ref 0–37)
Albumin: 4.4 g/dL (ref 3.5–5.2)
Alkaline Phosphatase: 44 U/L (ref 39–117)
BUN: 18 mg/dL (ref 6–23)
CO2: 28 mEq/L (ref 19–32)
Calcium: 9.7 mg/dL (ref 8.4–10.5)
Chloride: 105 mEq/L (ref 96–112)
Creatinine, Ser: 1.22 mg/dL (ref 0.40–1.50)
GFR: 58.37 mL/min — ABNORMAL LOW (ref >60.00)
Glucose, Bld: 116 mg/dL — ABNORMAL HIGH (ref 70–99)
Potassium: 4.2 mEq/L (ref 3.5–5.1)
Sodium: 141 mEq/L (ref 135–145)
Total Bilirubin: 0.5 mg/dL (ref 0.2–1.2)
Total Protein: 7.1 g/dL (ref 6.0–8.3)

## 2021-09-08 LAB — MICROALBUMIN / CREATININE URINE RATIO
Creatinine,U: 91.5 mg/dL
Microalb Creat Ratio: 13.4 mg/g (ref 0.0–30.0)
Microalb, Ur: 12.2 mg/dL — ABNORMAL HIGH (ref 0.0–1.9)

## 2021-09-08 LAB — HEMOGLOBIN A1C: Hgb A1c MFr Bld: 7.8 % — ABNORMAL HIGH (ref 4.6–6.5)

## 2021-09-08 MED ORDER — ACCU-CHEK GUIDE VI STRP: ORAL_STRIP | 0 refills | Status: DC

## 2021-09-08 NOTE — Progress Notes (Signed)
Chief Complaint  ?Patient presents with  ? Annual Exam  ? ? ?Well Male ?Ronald Barr is here for a complete physical.   ?His last physical was >1 year ago.  ?Current diet: in general, a "terrible" diet.   ?Current exercise: none ?Weight trend: stable ?Fatigue out of ordinary? No. ?Seat belt? Yes.   ?Advanced directive? No ? ?Health maintenance ?Shingrix- No ?Colonoscopy- Yes ?Tetanus- Yes ?Hep C- Yes ?Pneumonia vaccine- Yes ?AAA screening- Yes ? ?Past Medical History:  ?Diagnosis Date  ? Cancer [facility])   ? prostate  ? Diabetes mellitus without complication ([place])   ? Essential hypertension 03/06/2016  ? GERD (gastroesophageal reflux disease)   ? Hyperlipidemia   ? Sleep apnea   ? sleeps with BPAP machine  ?  ? ?Past Surgical History:  ?Procedure Laterality Date  ? nose and throat surgery    ? sleep apnes  ? SHOULDER SURGERY    ? TONSILLECTOMY    ? ? ?Medications  ?Current Outpatient Medications on File Prior to Visit  ?Medication Sig Dispense Refill  ? Accu-Chek Softclix Lancets lancets Check blood sugars once daily 200 each 12  ? amLODipine (NORVASC) 10 MG tablet TAKE ONE TABLET BY MOUTH DAILY 90 tablet 1  ? cyclobenzaprine (FLEXERIL) 10 MG tablet Take 1 tablet (10 mg total) by mouth at bedtime. 90 tablet 2  ? fenofibrate (TRICOR) 145 MG tablet TAKE ONE TABLET BY MOUTH DAILY 90 tablet 1  ? glipiZIDE (GLUCOTROL XL) 10 MG 24 hr tablet TAKE TWO TABLETS BY MOUTH DAILY 180 tablet 1  ? glucose blood (ACCU-CHEK GUIDE) test strip USE DAILY TO CHECK BLOOD SUGAR 50 strip 0  ? HYDROcodone bit-homatropine (HYCODAN) 5-1.5 MG/5ML syrup Take 5 mLs by mouth 2 (two) times daily as needed for cough. 120 mL 0  ? insulin degludec (TRESIBA FLEXTOUCH) 100 UNIT/ML FlexTouch Pen Inject 41 units at bedtime and 17 units in the morning. 6 mL 3  ? Insulin Pen Needle (DROPLET PEN NEEDLES) 32G X 6 MM MISC USE 1 NEEDLE TWO TIMES A DAY 100 each 0  ? IRON PO Take 1 tablet by mouth daily.    ? MAGNESIUM PO Take 1 tablet by mouth 2 (two) times daily  at 10 AM and 5 PM.    ? metFORMIN (GLUCOPHAGE) 850 MG tablet TAKE ONE TABLET BY MOUTH THREE TIMES A DAY WITH BREAKFAST, LUNCH AND EVENING MEAL 270 tablet 1  ? Multiple Vitamins-Minerals (MULTIVITAMIN PO) Take 1 tablet by mouth daily.    ? omeprazole (PRILOSEC) 20 MG capsule TAKE ONE CAPSULE BY MOUTH DAILY 90 capsule 3  ? pioglitazone (ACTOS) 30 MG tablet TAKE ONE TABLET BY MOUTH DAILY 90 tablet 1  ? pravastatin (PRAVACHOL) 10 MG tablet TAKE ONE TABLET BY MOUTH DAILY 90 tablet 1  ? ? ?Allergies ?Allergies  ?Allergen Reactions  ? Statins   ?  Cramping, intolerant of Crestor and Pravastatin  ? Sulfa Antibiotics   ? ? ?Family History ?Family History  ?Problem Relation Age of Onset  ? Hypertension Mother   ? Hypertension Father   ? ? ?Review of Systems: ?Constitutional:  no fevers ?Eye:  + recent significant loss in vision; having cataract surgery ?Ears:  No changes in hearing ?Nose/Mouth/Throat:  no complaints of nasal congestion, no sore throat ?Cardiovascular: no chest pain ?Respiratory:  No shortness of breath ?Gastrointestinal:  No change in bowel habits ?GU:  No frequency ?Integumentary:  no abnormal skin lesions reported ?Neurologic:  no headaches ?Endocrine:  denies unexplained weight changes ? ?  Exam ?BP 138/80   Pulse 74   Temp (!) 97.5 ?F (36.4 ?C) (Oral)   Ht '6\' 8"'$  (2.032 m)   Wt 293 lb 2 oz (133 kg)   SpO2 97%   BMI 32.20 kg/m?  ?General:  well developed, well nourished, in no apparent distress ?Skin:  no significant moles, warts, or growths ?Head:  no masses, lesions, or tenderness ?Eyes:  pupils equal and round, sclera anicteric without injection ?Ears:  canals without lesions, TMs shiny without retraction, no obvious effusion, no erythema ?Nose:  nares patent, septum midline, mucosa normal ?Throat/Pharynx:  lips and gingiva without lesion; tongue and uvula midline; non-inflamed pharynx; no exudates or postnasal drainage ?Lungs:  clear to auscultation, breath sounds equal bilaterally, no respiratory  distress ?Cardio:  regular rate and rhythm, no LE edema or bruits ?Rectal: Deferred ?GI: BS+, S, NT, ND, no masses or organomegaly ?Musculoskeletal:  symmetrical muscle groups noted without atrophy or deformity ?Neuro:  gait normal; deep tendon reflexes normal and symmetric; decreased sensation to pinprick over b/l feet over medial forefoot ?Psych: well oriented with normal range of affect and appropriate judgment/insight ? ?Assessment and Plan ? ?Well adult exam ? ?Diabetes mellitus type 2 in obese ([name]) - Plan: CBC, Comprehensive metabolic panel, Lipid panel, Hemoglobin A1c, Microalbumin / creatinine urine ratio  ? ?Well 76 y.o. male. ?Counseled on diet and exercise. ?Other orders as above. ?Advanced directive form requested today.  ?Follow up in 6 mo or prn.  ?The patient voiced understanding and agreement to the plan. ? ?Shelda Pal, DO ?09/08/21 ?9:46 AM ? ?

## 2021-09-08 NOTE — Patient Instructions (Addendum)
Give [place] 2-3 business days to get the results of your labs back.  ? ?Keep the diet clean and stay active. ? ?Please get me a copy of your advanced directive form at your convenience.  ? ?The Shingrix vaccine (for shingles) is a 2 shot series spaced 2-6 months apart. It can make people feel low energy, achy and almost like they have the flu for 48 hours after injection. 1/5 people can have nausea and/or vomiting. Please plan accordingly when deciding on when to get this shot. Call your pharmacy to get this. The second shot of the series is less severe regarding the side effects, but it still lasts 48 hours.  ? ?Take Metamucil or Benefiber daily. ? ?Let us know if you need anything. ?

## 2021-09-12 ENCOUNTER — Encounter: Payer: Self-pay | Admitting: Podiatry

## 2021-09-12 ENCOUNTER — Ambulatory Visit (INDEPENDENT_AMBULATORY_CARE_PROVIDER_SITE_OTHER): Payer: Medicare HMO | Admitting: Podiatry

## 2021-09-12 DIAGNOSIS — E1149 Type 2 diabetes mellitus with other diabetic neurological complication: Secondary | ICD-10-CM

## 2021-09-12 DIAGNOSIS — L84 Corns and callosities: Secondary | ICD-10-CM

## 2021-09-12 NOTE — Progress Notes (Signed)
Subjective: ?76 y.o. returns the office today for painful calluses to his feet that he will have trimmed the right side worse than left.  He has not seen any redness or swelling. Denies any open sores.  Denies any systemic complaints such as fevers, chills, nausea, vomiting.  ? ?PCP: Shelda Pal, DO last seen 05/27/21 ?A1c: 7.9 on 03/07/21 ? ?Objective: ?AAO ?3, NAD ?DP/PT pulses palpable, CRT less than 3 seconds ?Hyperkeratotic lesions present bilateral submetatarsal one and 5.  No underlying ulceration, drainage or any signs of infection.  Upon debridement of right submetatarsal 5 lesion some dried blood was present there is no ulcerations identified or signs of infection. ?Nails are minimally hypertrophic, dystrophic, brittle, discolored, elongated ?10. No surrounding redness or drainage. Tenderness nails 1-5 bilaterally.  ?No pain otherwise the left foot in particular. ?No open lesions or pre-ulcerative lesions are identified. ?No pain with calf compression, swelling, warmth, erythema. ? ?Assessment: ?Patient presents with symptomatic hyperkeratotic lesions;  type 2 diabetes with neuropathy ? ?Plan: ?-Treatment options including alternatives, risks, complications were discussed ?-Hyperkeratotic lesion sharply debrided x2 without any complications or bleeding ?-Discussed daily foot inspection. If there are any changes, to call the office immediately.  ?-Follow-up in 9 weeks or sooner if any problems are to arise. In the meantime, encouraged to call the office with any questions, concerns, changes symptoms. ? ? ?Lorenda Peck, DPM ? ?

## 2021-09-13 ENCOUNTER — Other Ambulatory Visit: Payer: Self-pay | Admitting: Family Medicine

## 2021-09-18 DIAGNOSIS — H2512 Age-related nuclear cataract, left eye: Secondary | ICD-10-CM | POA: Diagnosis not present

## 2021-09-18 DIAGNOSIS — H2513 Age-related nuclear cataract, bilateral: Secondary | ICD-10-CM | POA: Diagnosis not present

## 2021-09-19 DIAGNOSIS — H2511 Age-related nuclear cataract, right eye: Secondary | ICD-10-CM | POA: Diagnosis not present

## 2021-10-02 DIAGNOSIS — H2511 Age-related nuclear cataract, right eye: Secondary | ICD-10-CM | POA: Diagnosis not present

## 2021-10-02 DIAGNOSIS — H25041 Posterior subcapsular polar age-related cataract, right eye: Secondary | ICD-10-CM | POA: Diagnosis not present

## 2021-10-02 DIAGNOSIS — H2513 Age-related nuclear cataract, bilateral: Secondary | ICD-10-CM | POA: Diagnosis not present

## 2021-10-02 DIAGNOSIS — H25011 Cortical age-related cataract, right eye: Secondary | ICD-10-CM | POA: Diagnosis not present

## 2021-10-07 ENCOUNTER — Telehealth: Payer: Self-pay | Admitting: Family Medicine

## 2021-10-07 NOTE — Telephone Encounter (Signed)
Have not seen this paperwork yet and will do once receive it.

## 2021-10-07 NOTE — Telephone Encounter (Signed)
Tollie Eth.) called following up on document that was faxed over concerning quantity confirmation on a Rx that was written for [place] on 2.10.2022. Fax was sent over on 5.17.2023.

## 2021-10-14 ENCOUNTER — Other Ambulatory Visit: Payer: Self-pay | Admitting: Family Medicine

## 2021-10-14 DIAGNOSIS — E119 Type 2 diabetes mellitus without complications: Secondary | ICD-10-CM

## 2021-10-27 DIAGNOSIS — L820 Inflamed seborrheic keratosis: Secondary | ICD-10-CM | POA: Diagnosis not present

## 2021-10-27 DIAGNOSIS — L308 Other specified dermatitis: Secondary | ICD-10-CM | POA: Diagnosis not present

## 2021-10-27 DIAGNOSIS — I872 Venous insufficiency (chronic) (peripheral): Secondary | ICD-10-CM | POA: Diagnosis not present

## 2021-11-15 ENCOUNTER — Other Ambulatory Visit: Payer: Self-pay | Admitting: Family Medicine

## 2021-11-17 MED ORDER — DROPLET PEN NEEDLES 32G X 6 MM MISC: 0 refills | Status: DC

## 2021-11-19 ENCOUNTER — Other Ambulatory Visit: Payer: Self-pay | Admitting: Family Medicine

## 2021-11-19 DIAGNOSIS — E119 Type 2 diabetes mellitus without complications: Secondary | ICD-10-CM

## 2021-11-20 ENCOUNTER — Encounter: Payer: Self-pay | Admitting: Podiatry

## 2021-11-20 ENCOUNTER — Ambulatory Visit: Payer: Medicare HMO | Admitting: Podiatry

## 2021-11-20 DIAGNOSIS — B351 Tinea unguium: Secondary | ICD-10-CM | POA: Diagnosis not present

## 2021-11-20 DIAGNOSIS — E1149 Type 2 diabetes mellitus with other diabetic neurological complication: Secondary | ICD-10-CM | POA: Diagnosis not present

## 2021-11-20 DIAGNOSIS — L84 Corns and callosities: Secondary | ICD-10-CM | POA: Diagnosis not present

## 2021-11-20 DIAGNOSIS — M79675 Pain in left toe(s): Secondary | ICD-10-CM

## 2021-11-20 DIAGNOSIS — M79674 Pain in right toe(s): Secondary | ICD-10-CM | POA: Diagnosis not present

## 2021-11-20 NOTE — Progress Notes (Signed)
Subjective: 76 y.o. returns the office today for painful calluses to his feet that he will have trimmed the right side worse than left.  He has not seen any redness or swelling. Denies any open sores.  Denies any systemic complaints such as fevers, chills, nausea, vomiting.   PCP: Wendling, Nicholas Paul, DO last seen 05/27/21 A1c: 7.9 on 03/07/21  Objective: AAO 3, NAD DP/PT pulses palpable, CRT less than 3 seconds Hyperkeratotic lesions present bilateral submetatarsal one and 5.  No underlying ulceration, drainage or any signs of infection.  Upon debridement of right submetatarsal 5 lesion some dried blood was present there is no ulcerations identified or signs of infection. Nails are minimally hypertrophic, dystrophic, brittle, discolored, elongated 10. No surrounding redness or drainage. Tenderness nails 1-5 bilaterally.  No pain otherwise the left foot in particular. No open lesions or pre-ulcerative lesions are identified. No pain with calf compression, swelling, warmth, erythema.  Assessment: Patient presents with symptomatic hyperkeratotic lesions;  type 2 diabetes with neuropathy, pain due to onychomycosis   Plan: -Treatment options including alternatives, risks, complications were discussed -Hyperkeratotic lesion sharply debrided x2 without any complications or bleeding -Nails debrided without incident 1-5.  -Discussed daily foot inspection. If there are any changes, to call the office immediately.  -Follow-up in 9 weeks or sooner if any problems are to arise. In the meantime, encouraged to call the office with any questions, concerns, changes symptoms.   Ronald Barr, DPM  

## 2021-12-08 ENCOUNTER — Other Ambulatory Visit: Payer: Self-pay | Admitting: Family Medicine

## 2021-12-31 ENCOUNTER — Other Ambulatory Visit: Payer: Self-pay | Admitting: Family Medicine

## 2022-01-02 ENCOUNTER — Other Ambulatory Visit: Payer: Self-pay | Admitting: Family Medicine

## 2022-01-05 MED ORDER — DROPLET PEN NEEDLES 32G X 6 MM MISC: 0 refills | Status: DC

## 2022-01-11 ENCOUNTER — Other Ambulatory Visit: Payer: Self-pay | Admitting: Family Medicine

## 2022-01-22 ENCOUNTER — Ambulatory Visit: Payer: Medicare HMO | Admitting: Podiatry

## 2022-01-22 ENCOUNTER — Encounter: Payer: Self-pay | Admitting: Podiatry

## 2022-01-22 DIAGNOSIS — B351 Tinea unguium: Secondary | ICD-10-CM | POA: Diagnosis not present

## 2022-01-22 DIAGNOSIS — L84 Corns and callosities: Secondary | ICD-10-CM

## 2022-01-22 DIAGNOSIS — E1149 Type 2 diabetes mellitus with other diabetic neurological complication: Secondary | ICD-10-CM | POA: Diagnosis not present

## 2022-01-22 NOTE — Progress Notes (Signed)
Subjective: 76 y.o. returns the office today for painful calluses to his feet that he will have trimmed the right side worse than left.  He has not seen any redness or swelling. Denies any open sores.  Denies any systemic complaints such as fevers, chills, nausea, vomiting.   PCP: Wendling, Nicholas Paul, DO last seen 05/27/21 A1c: 7.9 on 03/07/21  Objective: AAO 3, NAD DP/PT pulses palpable, CRT less than 3 seconds Hyperkeratotic lesions present bilateral submetatarsal one and 5.  No underlying ulceration, drainage or any signs of infection.  Upon debridement of right submetatarsal 5 lesion some dried blood was present there is no ulcerations identified or signs of infection. Nails are minimally hypertrophic, dystrophic, brittle, discolored, elongated 10. No surrounding redness or drainage. Tenderness nails 1-5 bilaterally.  No pain otherwise the left foot in particular. No open lesions or pre-ulcerative lesions are identified. No pain with calf compression, swelling, warmth, erythema.  Assessment: Patient presents with symptomatic hyperkeratotic lesions;  type 2 diabetes with neuropathy, pain due to onychomycosis   Plan: -Treatment options including alternatives, risks, complications were discussed -Hyperkeratotic lesion sharply debrided x2 without any complications or bleeding -Nails debrided without incident 1-5.  -Discussed daily foot inspection. If there are any changes, to call the office immediately.  -Follow-up in 9 weeks or sooner if any problems are to arise. In the meantime, encouraged to call the office with any questions, concerns, changes symptoms.   Markiah Janeway, DPM  

## 2022-01-26 ENCOUNTER — Encounter: Payer: Self-pay | Admitting: Family Medicine

## 2022-02-05 ENCOUNTER — Encounter: Payer: Self-pay | Admitting: Family Medicine

## 2022-02-06 MED ORDER — TRESIBA FLEXTOUCH 100 UNIT/ML ~~LOC~~ SOPN: PEN_INJECTOR | SUBCUTANEOUS | 1 refills | Status: DC

## 2022-02-22 ENCOUNTER — Other Ambulatory Visit: Payer: Self-pay | Admitting: Family Medicine

## 2022-02-23 ENCOUNTER — Other Ambulatory Visit: Payer: Self-pay | Admitting: Family Medicine

## 2022-02-23 MED ORDER — DROPLET PEN NEEDLES 32G X 6 MM MISC: 0 refills | Status: DC

## 2022-02-23 MED ORDER — ACCU-CHEK GUIDE VI STRP: ORAL_STRIP | 1 refills | Status: DC

## 2022-02-23 MED ORDER — ACCU-CHEK GUIDE VI STRP: ORAL_STRIP | 0 refills | Status: DC

## 2022-02-27 ENCOUNTER — Encounter: Payer: Self-pay | Admitting: Family Medicine

## 2022-02-27 ENCOUNTER — Ambulatory Visit (INDEPENDENT_AMBULATORY_CARE_PROVIDER_SITE_OTHER): Payer: Medicare HMO | Admitting: Family Medicine

## 2022-02-27 VITALS — BP 135/83 | HR 82 | Temp 98.0°F | Ht >= 80 in | Wt 297.1 lb

## 2022-02-27 DIAGNOSIS — E669 Obesity, unspecified: Secondary | ICD-10-CM

## 2022-02-27 DIAGNOSIS — E1169 Type 2 diabetes mellitus with other specified complication: Secondary | ICD-10-CM

## 2022-02-27 DIAGNOSIS — Z23 Encounter for immunization: Secondary | ICD-10-CM

## 2022-02-27 MED ORDER — TIRZEPATIDE 7.5 MG/0.5ML ~~LOC~~ SOAJ: 7.5000 mg | SUBCUTANEOUS | 1 refills | Status: DC

## 2022-02-27 MED ORDER — TIRZEPATIDE 5 MG/0.5ML ~~LOC~~ SOAJ: 5.0000 mg | SUBCUTANEOUS | 0 refills | Status: DC

## 2022-02-27 MED ORDER — TIRZEPATIDE 2.5 MG/0.5ML ~~LOC~~ SOAJ: 2.5000 mg | SUBCUTANEOUS | 0 refills | Status: DC

## 2022-02-27 NOTE — Progress Notes (Signed)
Subjective:   Chief Complaint  Patient presents with   Weight Loss    Discuss Discuss if to be taking Actos or not    Ronald Barr is a 76 y.o. male here for follow-up of diabetes.   Ronald Barr's self monitored glucose range is low 100s's.  Patient denies hypoglycemic reactions. He checks his glucose levels 2 time(s) per day. Patient does require insulin.  Tresiba 41 units at bedtime, 17 units in the morning. Medications include: Metformin 850 mg 3 times daily, glipizide 10 mg twice daily, insulin as above. Diet could be better.  Exercise: None No chest pain or shortness of breath. Struggling w losing weight. Chronic pain prevents routine exercise, not interested in seeing ortho for this at the time. Diet could be better. Feels his gut is growing.   Past Medical History:  Diagnosis Date   Cancer [facility])    prostate   Diabetes mellitus without complication ([place])    Essential hypertension 03/06/2016   GERD (gastroesophageal reflux disease)    Hyperlipidemia    Sleep apnea    sleeps with BPAP machine     Related testing: Retinal exam: Done Pneumovax: done  Objective:  BP 135/83 (BP Location: Left Arm, Patient Position: Sitting, Cuff Size: Normal)   Pulse 82   Temp 98 F (36.7 C) (Oral)   Ht '6\' 8"'$  (2.032 m)   Wt 297 lb 2 oz (134.8 kg)   SpO2 93%   BMI 32.64 kg/m  General:  Well developed, well nourished, in no apparent distress Skin:  Warm, no pallor or diaphoresis Lungs:  CTAB, no access msc use Cardio:  RRR, no bruits, no LE edema Psych: Age appropriate judgment and insight  Assessment:   Diabetes mellitus type 2 in obese ([facility])   Plan:   Chronic, obesity is uncontrolled. Will have him continue metformin 850 mg TID. Start Moujaro 2.5 mg/week and escalate. He will let me know if there are cost issues. Counseled on diet and exercise. F/u as originally scheduled.  The patient voiced understanding and agreement to the plan.  [place], DO 02/27/22 9:02  AM

## 2022-02-27 NOTE — Patient Instructions (Addendum)
Don't fill the medication if more than $50 per month and let me know.   Keep the diet clean and stay active.  Let us know if you need anything.

## 2022-03-13 ENCOUNTER — Ambulatory Visit: Payer: Medicare HMO | Admitting: Family Medicine

## 2022-03-16 ENCOUNTER — Other Ambulatory Visit: Payer: Self-pay | Admitting: Family Medicine

## 2022-03-16 ENCOUNTER — Encounter: Payer: Self-pay | Admitting: Family Medicine

## 2022-03-16 ENCOUNTER — Ambulatory Visit (INDEPENDENT_AMBULATORY_CARE_PROVIDER_SITE_OTHER): Payer: Medicare HMO | Admitting: Family Medicine

## 2022-03-16 VITALS — BP 132/78 | HR 84 | Temp 97.7°F | Ht >= 80 in | Wt 289.5 lb

## 2022-03-16 DIAGNOSIS — I10 Essential (primary) hypertension: Secondary | ICD-10-CM | POA: Diagnosis not present

## 2022-03-16 DIAGNOSIS — E1169 Type 2 diabetes mellitus with other specified complication: Secondary | ICD-10-CM | POA: Diagnosis not present

## 2022-03-16 DIAGNOSIS — E669 Obesity, unspecified: Secondary | ICD-10-CM | POA: Diagnosis not present

## 2022-03-16 MED ORDER — ENALAPRIL MALEATE 10 MG PO TABS: 10.0000 mg | ORAL_TABLET | Freq: Every day | ORAL | 2 refills | Status: DC

## 2022-03-16 MED ORDER — TRESIBA FLEXTOUCH 100 UNIT/ML ~~LOC~~ SOPN: PEN_INJECTOR | SUBCUTANEOUS | 1 refills | Status: DC

## 2022-03-16 NOTE — Progress Notes (Signed)
Subjective:   Chief Complaint  Patient presents with   Follow-up    Ronald Barr is a 76 y.o. male here for follow-up of diabetes.   Cosme's self monitored glucose range is low 100's.  Patient denies hypoglycemic reactions. He checks his glucose levels 2 time(s) per day. Patient does require insulin.  Tresiba 41 u at night, 17 in AM Medications include: Mounjaro 2.5 mg/week, Metformin 850 mg TID, Actos 30 mg/d, Glipizide XL 10 mg bid Diet is healthy.  Exercise: none  Hypertension Patient presents for hypertension follow up. He does monitor home blood pressures. He is compliant with medications- Norvasc 10 mg/d. Patient has these side effects of medication: none Diet/exercise as above.  No Cp or SOB.   Past Medical History:  Diagnosis Date   Cancer [facility])    prostate   Diabetes mellitus without complication ([place])    Essential hypertension 03/06/2016   GERD (gastroesophageal reflux disease)    Hyperlipidemia    Sleep apnea    sleeps with BPAP machine     Related testing: Retinal exam: Done Pneumovax: done  Objective:  BP 132/78 (BP Location: Left Arm, Patient Position: Sitting, Cuff Size: Normal)   Pulse 84   Temp 97.7 F (36.5 C) (Oral)   Ht '6\' 8"'$  (2.032 m)   Wt 289 lb 8 oz (131.3 kg)   SpO2 95%   BMI 31.80 kg/m  General:  Well developed, well nourished, in no apparent distress Skin:  Warm, no pallor or diaphoresis Lungs:  CTAB, no access msc use Cardio:  RRR, no bruits Psych: Age appropriate judgment and insight  Assessment:   Diabetes mellitus type 2 in obese ([place]) - Plan: Comprehensive metabolic panel, Lipid panel, Hemoglobin A1c  Essential hypertension   Plan:   Chronic, stable. Cont Mounjaro escalation. He will send me readings 1-2 weeks into each new strength as we will decrease his Tresiba dosage, we will drop it from 41 and 17 to 35 and 14. Cont metformin 850 mg TID, Actos 30 mg/d, glipizide XR 10 mg bid. Counseled on diet and  exercise. Chronic, stable. Change Norvasc back to ACEi enalapril 20 mg/d. F/u in 6 mo. The patient voiced understanding and agreement to the plan.  [place], DO 03/16/22 3:32 PM

## 2022-03-16 NOTE — Patient Instructions (Signed)
Give [place] 2-3 business days to get the results of your labs back.   Keep the diet clean and stay active.  Continue increasing the Mounjaro as tolerated. 2 weeks after you increase dosages, send me a message with some readings. Send it sooner if you are having low sugar readings as we will need to adjust your insulin accordingly.   Let us know if you need anything.

## 2022-03-17 LAB — COMPREHENSIVE METABOLIC PANEL
ALT: 19 U/L (ref 0–53)
AST: 26 U/L (ref 0–37)
Albumin: 4.3 g/dL (ref 3.5–5.2)
Alkaline Phosphatase: 41 U/L (ref 39–117)
BUN: 23 mg/dL (ref 6–23)
CO2: 29 mEq/L (ref 19–32)
Calcium: 9.6 mg/dL (ref 8.4–10.5)
Chloride: 104 mEq/L (ref 96–112)
Creatinine, Ser: 1.3 mg/dL (ref 0.40–1.50)
GFR: 53.89 mL/min — ABNORMAL LOW (ref >60.00)
Glucose, Bld: 127 mg/dL — ABNORMAL HIGH (ref 70–99)
Potassium: 4.3 mEq/L (ref 3.5–5.1)
Sodium: 140 mEq/L (ref 135–145)
Total Bilirubin: 0.5 mg/dL (ref 0.2–1.2)
Total Protein: 6.8 g/dL (ref 6.0–8.3)

## 2022-03-17 LAB — LIPID PANEL
Cholesterol: 138 mg/dL (ref 0–200)
HDL: 40.5 mg/dL (ref >39.00)
LDL Cholesterol: 75 mg/dL (ref 0–99)
NonHDL: 97.05
Total CHOL/HDL Ratio: 3
Triglycerides: 108 mg/dL (ref 0.0–149.0)
VLDL: 21.6 mg/dL (ref 0.0–40.0)

## 2022-03-17 LAB — HEMOGLOBIN A1C: Hgb A1c MFr Bld: 8.2 % — ABNORMAL HIGH (ref 4.6–6.5)

## 2022-03-18 ENCOUNTER — Encounter: Payer: Self-pay | Admitting: Family Medicine

## 2022-03-24 ENCOUNTER — Other Ambulatory Visit: Payer: Self-pay | Admitting: Family Medicine

## 2022-03-26 ENCOUNTER — Ambulatory Visit: Payer: Medicare HMO | Admitting: Podiatrist

## 2022-03-27 ENCOUNTER — Ambulatory Visit: Payer: Medicare HMO | Admitting: Podiatrist

## 2022-03-27 DIAGNOSIS — E1149 Type 2 diabetes mellitus with other diabetic neurological complication: Secondary | ICD-10-CM | POA: Diagnosis not present

## 2022-03-27 DIAGNOSIS — L84 Corns and callosities: Secondary | ICD-10-CM

## 2022-03-28 ENCOUNTER — Encounter: Payer: Self-pay | Admitting: Podiatrist

## 2022-03-28 NOTE — Progress Notes (Signed)
Subjective: Ronald Barr is a 76 y.o. male patient who presents to office today with concern of hyperkeratotic calluses on the bottom of both feet.  He has them trimmed periodically to help reduce the discomfort of the lesions.  He denies any drainage or signs of infection.   Patient Active Problem List   Diagnosis Date Noted   Sleep apnea treated with nocturnal BiPAP 12/24/2020   Severe sleep apnea 12/24/2020   Retrognathia 12/24/2020   Mild cognitive impairment 04/26/2020   Greater trochanteric bursitis of left hip 12/01/2018   OSA treated with BiPAP 02/24/2018   Calcific tendinitis of right shoulder 01/16/2018   Medial epicondylitis of elbow, left 09/20/2017   Diabetes mellitus type 2 in obese ([place]) 09/16/2017   Hyperlipidemia 03/11/2017   Dupuytrens contracture 01/07/2017   Trigger thumb, right thumb 12/17/2016   Essential hypertension 03/06/2016   Primary osteoarthritis of left knee 07/10/2014   Trochanteric bursitis of left hip 07/10/2014   Chronic venous insufficiency 05/31/2014   Current Outpatient Medications on File Prior to Visit  Medication Sig Dispense Refill   Accu-Chek Softclix Lancets lancets Check blood sugars once daily 200 each 12   cyclobenzaprine (FLEXERIL) 10 MG tablet TAKE 1 TABLET BY MOUTH AT BEDTIME 90 tablet 2   enalapril (VASOTEC) 10 MG tablet Take 1 tablet (10 mg total) by mouth daily. 90 tablet 2   fenofibrate (TRICOR) 145 MG tablet TAKE ONE TABLET BY MOUTH DAILY 90 tablet 1   glipiZIDE (GLUCOTROL XL) 10 MG 24 hr tablet TAKE TWO TABLETS BY MOUTH DAILY 180 tablet 1   glucose blood (ACCU-CHEK GUIDE) test strip Use twice daily to check blood sugar.  DX E11.69 200 strip 1   insulin degludec (TRESIBA FLEXTOUCH) 100 UNIT/ML FlexTouch Pen INJECT 41 UNITS AT BEDTIME AND INJECT 17 UNITS IN THE MORNING 15 mL 1   Insulin Pen Needle (DROPLET PEN NEEDLES) 32G X 6 MM MISC USE 1 NEEDLE TWO TIMES A DAY 100 each 0   IRON PO Take 1 tablet by mouth daily.     MAGNESIUM PO  Take 1 tablet by mouth 2 (two) times daily at 10 AM and 5 PM.     metFORMIN (GLUCOPHAGE) 850 MG tablet TAKE ONE TABLET BY MOUTH THREE TIMES A DAY WITH BREAKFAST, LUNCH AND EVENING MEAL 270 tablet 1   Multiple Vitamins-Minerals (MULTIVITAMIN PO) Take 1 tablet by mouth daily.     omeprazole (PRILOSEC) 20 MG capsule TAKE ONE CAPSULE BY MOUTH DAILY 90 capsule 3   pioglitazone (ACTOS) 30 MG tablet TAKE ONE TABLET BY MOUTH DAILY 90 tablet 1   pravastatin (PRAVACHOL) 10 MG tablet TAKE ONE TABLET BY MOUTH DAILY 90 tablet 1   tirzepatide (MOUNJARO) 2.5 MG/0.5ML Pen Inject 2.5 mg into the skin once a week. 2 mL 0   tirzepatide (MOUNJARO) 5 MG/0.5ML Pen Inject 5 mg into the skin once a week. 2 mL 0   tirzepatide (MOUNJARO) 7.5 MG/0.5ML Pen Inject 7.5 mg into the skin once a week. 2 mL 1   No current facility-administered medications on file prior to visit.   Allergies  Allergen Reactions   Statins     Cramping, intolerant of Crestor and Pravastatin   Sulfa Antibiotics      Objective: General: Patient is awake, alert, and oriented x 3 and in no acute distress.  Integument: Skin is warm, dry and supple bilateral.  Hyperkeratotic lesions present submetatarsal 1 and 5 bilateral. With right being more symptomatic than left.  Upon debridement, intact  integument is present with no bleeding noted. No intractable porokeratotic lesion is noted.    Vasculature:  Dorsalis Pedis pulse 2/4 bilateral. Posterior Tibial pulse  1/4 bilateral.  Capillary fill time <3 sec 1-5 bilateral. Positive hair growth to the level of the digits. Temperature gradient within normal limits. No varicosities present bilateral. No edema present bilateral.   Neurology: The patient has decreased sensation measured with a 5.07/10g Semmes Weinstein Monofilament 2/5 sites bilateral . Vibratory sensation diminished bilateral with tuning fork. No Babinski sign present bilateral.   Musculoskeletal: No symptomatic pedal deformities noted  bilateral. Muscular strength 5/5 in all lower extremity muscular groups bilateral without pain on range of motion . No tenderness with calf compression bilateral.  Assessment and Plan:    ICD-10-CM   1. Callus under metatarsal head  L84     2. Pre-ulcerative corn or callous  L84     3. Type II diabetes mellitus with neurological manifestations ([place])  E11.49       -Examined patient. - pared the calluses today with a 15 blade to a safe level.   -Answered all patient questions -Patient to return  in 3 months for continued foot care.  -Patient advised to call the office if any problems or questions arise in the meantime.  Bronson Ing, DPM

## 2022-04-06 ENCOUNTER — Ambulatory Visit (INDEPENDENT_AMBULATORY_CARE_PROVIDER_SITE_OTHER): Payer: Medicare HMO | Admitting: *Deleted

## 2022-04-06 VITALS — BP 118/72 | HR 90 | Ht >= 80 in | Wt 282.8 lb

## 2022-04-06 DIAGNOSIS — Z0000 Encounter for general adult medical examination without abnormal findings: Secondary | ICD-10-CM

## 2022-04-06 NOTE — Progress Notes (Signed)
Subjective:   Ronald Barr is a 76 y.o. male who presents for Medicare Annual/Subsequent preventive examination.  Review of Systems    Defer to PCP Cardiac Risk Factors include: diabetes mellitus;advanced age (>21mn, >>80women);male gender;dyslipidemia;hypertension;obesity (BMI >30kg/m2)     Objective:    Today's Vitals   04/06/22 0944  BP: 118/72  Pulse: 90  Weight: 282 lb 12.8 oz (128.3 kg)  Height: '6\' 8"'$  (2.032 m)   Body mass index is 31.07 kg/m.     04/06/2022    9:47 AM 04/01/2021    9:15 AM 07/20/2018    9:05 PM 04/26/2018    4:52 PM 03/10/2018    5:03 AM 05/31/2014    1:36 PM  Advanced Directives  Does Patient Have a Medical Advance Directive? Yes Yes Yes Yes No Yes  Type of Advance Directive Living will Living will Living will   HEchelonLiving will  Does patient want to make changes to medical advance directive? No - Patient declined  No - Patient declined No - Patient declined    Copy of HFerrumin Chart?      No - copy requested  Would patient like information on creating a medical advance directive?     No - Patient declined     Current Medications (verified) Outpatient Encounter Medications as of 04/06/2022  Medication Sig   Accu-Chek Softclix Lancets lancets Check blood sugars once daily   cyclobenzaprine (FLEXERIL) 10 MG tablet TAKE 1 TABLET BY MOUTH AT BEDTIME   enalapril (VASOTEC) 10 MG tablet Take 1 tablet (10 mg total) by mouth daily.   fenofibrate (TRICOR) 145 MG tablet TAKE ONE TABLET BY MOUTH DAILY   glipiZIDE (GLUCOTROL XL) 10 MG 24 hr tablet TAKE TWO TABLETS BY MOUTH DAILY   glucose blood (ACCU-CHEK GUIDE) test strip Use twice daily to check blood sugar.  DX E11.69   insulin degludec (TRESIBA FLEXTOUCH) 100 UNIT/ML FlexTouch Pen INJECT 41 UNITS AT BEDTIME AND INJECT 17 UNITS IN THE MORNING   Insulin Pen Needle (DROPLET PEN NEEDLES) 32G X 6 MM MISC USE 1 NEEDLE TWO TIMES A DAY   IRON PO Take 1 tablet  by mouth daily.   MAGNESIUM PO Take 1 tablet by mouth 2 (two) times daily at 10 AM and 5 PM.   metFORMIN (GLUCOPHAGE) 850 MG tablet TAKE ONE TABLET BY MOUTH THREE TIMES A DAY WITH BREAKFAST, LUNCH AND EVENING MEAL   Multiple Vitamins-Minerals (MULTIVITAMIN PO) Take 1 tablet by mouth daily.   omeprazole (PRILOSEC) 20 MG capsule TAKE ONE CAPSULE BY MOUTH DAILY   pioglitazone (ACTOS) 30 MG tablet TAKE ONE TABLET BY MOUTH DAILY   pravastatin (PRAVACHOL) 10 MG tablet TAKE ONE TABLET BY MOUTH DAILY   tirzepatide (MOUNJARO) 2.5 MG/0.5ML Pen Inject 2.5 mg into the skin once a week.   tirzepatide ([facility] 5 MG/0.5ML Pen Inject 5 mg into the skin once a week.   tirzepatide (MOUNJARO) 7.5 MG/0.5ML Pen Inject 7.5 mg into the skin once a week.   No facility-administered encounter medications on file as of 04/06/2022.    Allergies (verified) Statins and Sulfa antibiotics   History: Past Medical History:  Diagnosis Date   [place]    [place]   [place]    Essential hypertension 03/06/2016   GERD (gastroesophageal reflux disease)    Hyperlipidemia    Sleep apnea    sleeps with BPAP machine   Past Surgical History:  Procedure Laterality Date  nose and throat surgery     sleep apnes   SHOULDER SURGERY     TONSILLECTOMY     Family History  Problem Relation Age of Onset   Hypertension Mother    Hypertension Father    Social History   Socioeconomic History   Marital status: Married    Spouse name: Tharon Aquas   Number of children: 0   Years of education: Not on file   Highest education level: Master's degree (e.g., MA, MS, MEng, MEd, MSW, MBA)  Occupational History   Not on file  Tobacco Use   Smoking status: Former    Types: Cigarettes    Quit date: 06/01/1979    Years since quitting: 42.8   Smokeless tobacco: Never  Substance and Sexual Activity   Alcohol use: Not Currently   Drug use: No   Sexual activity: Not on file  Other Topics  Concern   Not on file  Social History Narrative   Lives with wife   Right handed   Caffeine: 1 cup of coffee and I soda can a day   Social Determinants of Health   Financial Resource Strain: Low Risk  (04/01/2021)   Overall Financial Resource Strain (CARDIA)    Difficulty of Paying Living Expenses: Not hard at all  Food Insecurity: No Food Insecurity (04/06/2022)   Hunger Vital Sign    Worried About Running Out of Food in the Last Year: Never true    Ran Out of Food in the Last Year: Never true  Transportation Needs: No Transportation Needs (04/06/2022)   PRAPARE - Hydrologist (Medical): No    Lack of Transportation (Non-Medical): No  Physical Activity: Inactive (04/01/2021)   Exercise Vital Sign    Days of Exercise per Week: 0 days    Minutes of Exercise per Session: 0 min  Stress: No Stress Concern Present (04/01/2021)   [place]    Feeling of Stress : Not at all  Social Connections: Moderately Isolated (04/01/2021)   Social Connection and Isolation Panel [NHANES]    Frequency of Communication with Friends and Family: More than three times a week    Frequency of Social Gatherings with Friends and Family: More than three times a week    Attends Religious Services: Never    Marine scientist or Organizations: No    Attends Music therapist: Never    Marital Status: Married    Tobacco Counseling Counseling given: Not Answered   Clinical Intake:  Pre-visit preparation completed: Yes  Pain : No/denies pain  Nutritional Risks: None Diabetes: Yes CBG done?: No Did pt. bring in CBG monitor from home?: No  How often do you need to have someone help you when you read instructions, pamphlets, or other written materials from your doctor or pharmacy?: 1 - Never       Financial Strains and Diabetes Management:  Are you having any financial strains with the  device, your supplies or your medication? No .  Does the patient want to be seen by Chronic Care Management for management of their diabetes?  No  Would the patient like to be referred to a Nutritionist or for Diabetic Management?  No   Diabetic Exams:  Diabetic Eye Exam: Completed 07/17/21 Diabetic Foot Exam: Completed 09/08/21   Interpreter Needed?: No  Information entered by :: Beatris Ship, [place]   Activities of Daily Living    04/06/2022  9:49 AM  In your present state of health, do you have any difficulty performing the following activities:  Hearing? 0  Vision? 0  Difficulty concentrating or making decisions? 0  Walking or climbing stairs? 0  Dressing or bathing? 0  Doing errands, shopping? 0  Preparing Food and eating ? N  Using the Toilet? N  In the past six months, have you accidently leaked urine? Y  Do you have problems with loss of bowel control? N  Managing your Medications? N  Managing your Finances? N  Housekeeping or managing your Housekeeping? N    Patient Care Team: Shelda Pal, DO as PCP - General (Family Medicine)  Indicate any recent Medical Services you may have received from other than [facility] providers in the past year (date may be approximate).     Assessment:   This is a routine wellness examination for Mikael.  Hearing/Vision screen No results found.  Dietary issues and exercise activities discussed: Current Exercise Habits: The patient does not participate in regular exercise at present, Exercise limited by: None identified   Goals Addressed             This Visit's Progress    Patient Stated   On track    Maintain current health       Depression Screen    04/06/2022    9:48 AM 03/16/2022    3:20 PM 04/01/2021    9:17 AM 09/03/2020    9:12 AM 03/06/2016    8:40 AM  PHQ 2/9 Scores  PHQ - 2 Score 0 0 0 0 0  PHQ- 9 Score  0       Fall Risk    04/06/2022    9:47 AM 03/16/2022    3:20 PM 04/01/2021    9:16 AM  03/06/2016    8:40 AM  Fall Risk   Falls in the past year? 0 0 0 No  Number falls in past yr: 0 0 0   Injury with Fall? 0 0 0   Risk for fall due to : No Fall Risks No Fall Risks    Follow up Falls evaluation completed Falls evaluation completed Falls prevention discussed     FALL RISK PREVENTION PERTAINING TO THE HOME:  Any stairs in or around the home? No  If so, are there any without handrails? No  Home free of loose throw rugs in walkways, pet beds, electrical cords, etc? Yes  Adequate lighting in your home to reduce risk of falls? Yes   ASSISTIVE DEVICES UTILIZED TO PREVENT FALLS:  Life alert? No  Use of a cane, walker or w/c? No  Grab bars in the bathroom? Yes  Shower chair or bench in shower? Yes  Elevated toilet seat or a handicapped toilet? Yes   TIMED UP AND GO:  Was the test performed? Yes .  Length of time to ambulate 10 feet: 5 sec.   Gait steady and fast without use of assistive device  Cognitive Function:      03/27/2020    9:00 AM  Montreal Cognitive Assessment   Visuospatial/ Executive (0/5) 5  Naming (0/3) 3  Attention: Read list of digits (0/2) 2  Attention: Read list of letters (0/1) 1  Attention: Serial 7 subtraction starting at 100 (0/3) 3  Language: Repeat phrase (0/2) 1  Language : Fluency (0/1) 1  Abstraction (0/2) 2  Delayed Recall (0/5) 0  Orientation (0/6) 6  Total 24  Adjusted Score (based on education) 24  04/06/2022    9:52 AM  6CIT Screen  What Year? 0 points  What month? 0 points  What time? 0 points  Count back from 20 0 points  Months in reverse 0 points  Repeat phrase 2 points  Total Score 2 points    Immunizations Immunization History  Administered Date(s) Administered   Fluad Quad(high Dose 65+) 02/02/2019, 03/05/2020, 03/07/2021, 02/27/2022   Influenza, High Dose Seasonal PF 03/06/2016, 03/11/2017, 02/24/2018   PFIZER(Purple Top)SARS-COV-2 Vaccination 06/24/2019, 07/17/2019, 03/09/2020   PNEUMOCOCCAL  CONJUGATE-20 03/07/2021   Pneumococcal Conjugate-13 02/08/2014   Pneumococcal Polysaccharide-23 03/07/2015   Pneumococcal-Unspecified 03/07/2015   Td 03/06/2016    TDAP status: Up to date  Flu Vaccine status: Up to date  Pneumococcal vaccine status: Up to date  Covid-19 vaccine status: Information provided on how to obtain vaccines.   Qualifies for Shingles Vaccine? Yes   Zostavax completed No   Shingrix Completed?: No.    Education has been provided regarding the importance of this vaccine. Patient has been advised to call insurance company to determine out of pocket expense if they have not yet received this vaccine. Advised may also receive vaccine at local pharmacy or Health Dept. Verbalized acceptance and understanding.  Screening Tests Health Maintenance  Topic Date Due   Medicare Annual Wellness (AWV)  04/01/2022   COVID-19 Vaccine (4 - 2023-24 season) 09/14/2022 (Originally 01/09/2022)   Zoster Vaccines- Shingrix (1 of 2) 09/14/2022 (Originally 02/08/1997)   OPHTHALMOLOGY EXAM  07/18/2022   Diabetic kidney evaluation - Urine ACR  09/09/2022   FOOT EXAM  09/09/2022   HEMOGLOBIN A1C  09/14/2022   Diabetic kidney evaluation - GFR measurement  03/17/2023   COLONOSCOPY (Pts 45-57yr Insurance coverage will need to be confirmed)  05/12/2023   Pneumonia Vaccine 75 Years old  Completed   INFLUENZA VACCINE  Completed   Hepatitis C Screening  Completed   HPV VACCINES  Aged Out    Health Maintenance  Health Maintenance Due  Topic Date Due   Medicare Annual Wellness (AWV)  04/01/2022    Colorectal cancer screening: Type of screening: Colonoscopy. Completed 05/11/13. Repeat every 10 years  Lung Cancer Screening: (Low Dose CT Chest recommended if Age 75-80years, 30 pack-year currently smoking OR have quit w/in 15years.) does not qualify.   Additional Screening:  Hepatitis C Screening: does qualify; Completed 03/06/16  Vision Screening: Recommended annual ophthalmology exams  for early detection of glaucoma and other disorders of the eye. Is the patient up to date with their annual eye exam?  Yes  Who is the provider or what is the name of the office in which the patient attends annual eye exams? [facility] not [facility] with a provider, would they like to be referred to a provider to establish care? No .   Dental Screening: Recommended annual dental exams for proper oral hygiene  Community Resource Referral / Chronic Care Management: CRR required this visit?  No   CCM required this visit?  No      Plan:     I have personally reviewed and noted the following in the patient's chart:   Medical and social history Use of alcohol, tobacco or illicit drugs  Current medications and supplements including opioid prescriptions. Patient is not currently taking opioid prescriptions. Functional ability and status Nutritional status Physical activity Advanced directives List of other physicians Hospitalizations, surgeries, and ER visits in previous 12 months Vitals Screenings to include cognitive, depression, and falls Referrals and appointments  In addition, I have reviewed and discussed with patient certain preventive protocols, quality metrics, and best practice recommendations. A written personalized care plan for preventive services as well as general preventive health recommendations were provided to patient.     Beatris Ship, [place]   04/06/2022   Nurse Notes: None

## 2022-04-06 NOTE — Patient Instructions (Signed)
Ronald Barr , Thank you for taking time to come for your Medicare Wellness Visit. I appreciate your ongoing commitment to your health goals. Please review the following plan we discussed and let me know if I can assist you in the future.   These are the goals we discussed:  Goals      Patient Stated     Maintain current health        This is a list of the screening recommended for you and due dates:  Health Maintenance  Topic Date Due   COVID-19 Vaccine (4 - 2023-24 season) 09/14/2022*   Zoster (Shingles) Vaccine (1 of 2) 09/14/2022*   Eye exam for diabetics  07/18/2022   Yearly kidney health urinalysis for diabetes  09/09/2022   Complete foot exam   09/09/2022   Hemoglobin A1C  09/14/2022   Yearly kidney function blood test for diabetes  03/17/2023   Medicare Annual Wellness Visit  04/07/2023   Colon Cancer Screening  05/12/2023   Pneumonia Vaccine  Completed   Flu Shot  Completed   Hepatitis C Screening: USPSTF Recommendation to screen - Ages 18-79 yo.  Completed   HPV Vaccine  Aged Out  *Topic was postponed. The date shown is not the original due date.     Next appointment: Follow up in one year for your annual wellness visit.   Preventive Care 75 Years and Older, Male Preventive care refers to lifestyle choices and visits with your health care provider that can promote health and wellness. What does preventive care include? A yearly physical exam. This is also called an annual well check. Dental exams once or twice a year. Routine eye exams. Ask your health care provider how often you should have your eyes checked. Personal lifestyle choices, including: Daily care of your teeth and gums. Regular physical activity. Eating a healthy diet. Avoiding tobacco and drug use. Limiting alcohol use. Practicing safe sex. Taking low doses of aspirin every day. Taking vitamin and mineral supplements as recommended by your health care provider. What happens during an annual well  check? The services and screenings done by your health care provider during your annual well check will depend on your age, overall health, lifestyle risk factors, and family history of disease. Counseling  Your health care provider may ask you questions about your: Alcohol use. Tobacco use. Drug use. Emotional well-being. Home and relationship well-being. Sexual activity. Eating habits. History of falls. Memory and ability to understand (cognition). Work and work Statistician. Screening  You may have the following tests or measurements: Height, weight, and BMI. Blood pressure. Lipid and cholesterol levels. These may be checked every 5 years, or more frequently if you are over 60 years old. Skin check. Lung cancer screening. You may have this screening every year starting at age 75 if you have a 30-pack-year history of smoking and currently smoke or have quit within the past 15 years. Fecal occult blood test (FOBT) of the stool. You may have this test every year starting at age 75. Flexible sigmoidoscopy or colonoscopy. You may have a sigmoidoscopy every 5 years or a colonoscopy every 10 years starting at age 75. Prostate cancer screening. Recommendations will vary depending on your family history and other risks. Hepatitis C blood test. Hepatitis B blood test. Sexually transmitted disease (STD) testing. Diabetes screening. This is done by checking your blood sugar (glucose) after you have not eaten for a while (fasting). You may have this done every 1-3 years. Abdominal aortic aneurysm (AAA)  screening. You may need this if you are a current or former smoker. Osteoporosis. You may be screened starting at age 75 if you are at high risk. Talk with your health care provider about your test results, treatment options, and if necessary, the need for more tests. Vaccines  Your health care provider may recommend certain vaccines, such as: Influenza vaccine. This is recommended every  year. Tetanus, diphtheria, and acellular pertussis (Tdap, Td) vaccine. You may need a Td booster every 10 years. Zoster vaccine. You may need this after age 75. Pneumococcal 13-valent conjugate (PCV13) vaccine. One dose is recommended after age 75. Pneumococcal polysaccharide (PPSV23) vaccine. One dose is recommended after age 75. Talk to your health care provider about which screenings and vaccines you need and how often you need them. This information is not intended to replace advice given to you by your health care provider. Make sure you discuss any questions you have with your health care provider. Document Released: 05/24/2015 Document Revised: 01/15/2016 Document Reviewed: 02/26/2015 Elsevier Interactive Patient Education  2017 [place] Prevention in the Home Falls can cause injuries. They can happen to people of all ages. There are many things you can do to make your home safe and to help prevent falls. What can I do on the outside of my home? Regularly fix the edges of walkways and driveways and fix any cracks. Remove anything that might make you trip as you walk through a door, such as a raised step or threshold. Trim any bushes or trees on the path to your home. Use bright outdoor lighting. Clear any walking paths of anything that might make someone trip, such as rocks or tools. Regularly check to see if handrails are loose or broken. Make sure that both sides of any steps have handrails. Any raised decks and porches should have guardrails on the edges. Have any leaves, snow, or ice cleared regularly. Use sand or salt on walking paths during winter. Clean up any spills in your garage right away. This includes oil or grease spills. What can I do in the bathroom? Use night lights. Install grab bars by the toilet and in the tub and shower. Do not use towel bars as grab bars. Use non-skid mats or decals in the tub or shower. If you need to sit down in the shower, use a  plastic, non-slip stool. Keep the floor dry. Clean up any water that spills on the floor as soon as it happens. Remove soap buildup in the tub or shower regularly. Attach bath mats securely with double-sided non-slip rug tape. Do not have throw rugs and other things on the floor that can make you trip. What can I do in the bedroom? Use night lights. Make sure that you have a light by your bed that is easy to reach. Do not use any sheets or blankets that are too big for your bed. They should not hang down onto the floor. Have a firm chair that has side arms. You can use this for support while you get dressed. Do not have throw rugs and other things on the floor that can make you trip. What can I do in the kitchen? Clean up any spills right away. Avoid walking on wet floors. Keep items that you use a lot in easy-to-reach places. If you need to reach something above you, use a strong step stool that has a grab bar. Keep electrical cords out of the way. Do not use floor polish or  wax that makes floors slippery. If you must use wax, use non-skid floor wax. Do not have throw rugs and other things on the floor that can make you trip. What can I do with my stairs? Do not leave any items on the stairs. Make sure that there are handrails on both sides of the stairs and use them. Fix handrails that are broken or loose. Make sure that handrails are as long as the stairways. Check any carpeting to make sure that it is firmly attached to the stairs. Fix any carpet that is loose or worn. Avoid having throw rugs at the top or bottom of the stairs. If you do have throw rugs, attach them to the floor with carpet tape. Make sure that you have a light switch at the top of the stairs and the bottom of the stairs. If you do not have them, ask someone to add them for you. What else can I do to help prevent falls? Wear shoes that: Do not have high heels. Have rubber bottoms. Are comfortable and fit you  well. Are closed at the toe. Do not wear sandals. If you use a stepladder: Make sure that it is fully opened. Do not climb a closed stepladder. Make sure that both sides of the stepladder are locked into place. Ask someone to hold it for you, if possible. Clearly mark and make sure that you can see: Any grab bars or handrails. First and last steps. Where the edge of each step is. Use tools that help you move around (mobility aids) if they are needed. These include: Canes. Walkers. Scooters. Crutches. Turn on the lights when you go into a dark area. Replace any light bulbs as soon as they burn out. Set up your furniture so you have a clear path. Avoid moving your furniture around. If any of your floors are uneven, fix them. If there are any pets around you, be aware of where they are. Review your medicines with your doctor. Some medicines can make you feel dizzy. This can increase your chance of falling. Ask your doctor what other things that you can do to help prevent falls. This information is not intended to replace advice given to you by your health care provider. Make sure you discuss any questions you have with your health care provider. Document Released: 02/21/2009 Document Revised: 10/03/2015 Document Reviewed: 06/01/2014 Elsevier Interactive Patient Education  2017 [employer].

## 2022-04-08 ENCOUNTER — Other Ambulatory Visit: Payer: Self-pay | Admitting: Family Medicine

## 2022-04-09 ENCOUNTER — Encounter: Payer: Self-pay | Admitting: Family Medicine

## 2022-04-09 ENCOUNTER — Other Ambulatory Visit: Payer: Self-pay | Admitting: Family Medicine

## 2022-04-09 MED ORDER — DROPLET PEN NEEDLES 32G X 6 MM MISC: 0 refills | Status: DC

## 2022-04-09 MED ORDER — TRESIBA FLEXTOUCH 100 UNIT/ML ~~LOC~~ SOPN: PEN_INJECTOR | SUBCUTANEOUS | 1 refills | Status: DC

## 2022-04-15 ENCOUNTER — Other Ambulatory Visit: Payer: Self-pay | Admitting: Family Medicine

## 2022-04-15 DIAGNOSIS — E119 Type 2 diabetes mellitus without complications: Secondary | ICD-10-CM

## 2022-05-05 ENCOUNTER — Other Ambulatory Visit: Payer: Self-pay | Admitting: Family Medicine

## 2022-05-05 DIAGNOSIS — E1169 Type 2 diabetes mellitus with other specified complication: Secondary | ICD-10-CM

## 2022-05-09 ENCOUNTER — Other Ambulatory Visit: Payer: Self-pay | Admitting: Family Medicine

## 2022-05-21 ENCOUNTER — Other Ambulatory Visit: Payer: Self-pay | Admitting: Family Medicine

## 2022-05-21 DIAGNOSIS — E119 Type 2 diabetes mellitus without complications: Secondary | ICD-10-CM

## 2022-05-21 DIAGNOSIS — K219 Gastro-esophageal reflux disease without esophagitis: Secondary | ICD-10-CM

## 2022-05-25 ENCOUNTER — Other Ambulatory Visit: Payer: Self-pay | Admitting: Family Medicine

## 2022-05-26 DIAGNOSIS — L308 Other specified dermatitis: Secondary | ICD-10-CM | POA: Diagnosis not present

## 2022-05-26 DIAGNOSIS — L820 Inflamed seborrheic keratosis: Secondary | ICD-10-CM | POA: Diagnosis not present

## 2022-05-26 DIAGNOSIS — L720 Epidermal cyst: Secondary | ICD-10-CM | POA: Diagnosis not present

## 2022-05-27 ENCOUNTER — Other Ambulatory Visit: Payer: Self-pay | Admitting: Family Medicine

## 2022-05-27 ENCOUNTER — Encounter: Payer: Self-pay | Admitting: Family Medicine

## 2022-05-27 MED ORDER — DROPLET PEN NEEDLES 32G X 6 MM MISC: 0 refills | Status: DC

## 2022-05-28 ENCOUNTER — Ambulatory Visit: Payer: Medicare HMO | Admitting: Podiatry

## 2022-05-29 ENCOUNTER — Encounter: Payer: Self-pay | Admitting: Podiatry

## 2022-05-29 ENCOUNTER — Ambulatory Visit: Payer: Medicare HMO | Admitting: Podiatry

## 2022-05-29 DIAGNOSIS — E1149 Type 2 diabetes mellitus with other diabetic neurological complication: Secondary | ICD-10-CM

## 2022-05-29 DIAGNOSIS — L84 Corns and callosities: Secondary | ICD-10-CM

## 2022-05-29 NOTE — Progress Notes (Signed)
Subjective: 77 y.o. returns the office today for painful calluses to his feet that he will have trimmed the right side worse than left.  He has not seen any redness or swelling. Denies any open sores.  Denies any systemic complaints such as fevers, chills, nausea, vomiting.   PCP: Shelda Pal, DO last seen 05/27/21 A1c: 7.9 on 03/07/21  Objective: AAO 3, NAD DP/PT pulses palpable, CRT less than 3 seconds Hyperkeratotic lesions present bilateral submetatarsal one and 5.  No underlying ulceration, drainage or any signs of infection.  Upon debridement of right submetatarsal 5 lesion some dried blood was present there is no ulcerations identified or signs of infection. Nails are minimally hypertrophic, dystrophic, brittle, discolored, elongated 10. No surrounding redness or drainage. Tenderness nails 1-5 bilaterally.  No pain otherwise the left foot in particular. No open lesions or pre-ulcerative lesions are identified. No pain with calf compression, swelling, warmth, erythema.  Assessment: Patient presents with symptomatic hyperkeratotic lesions;  type 2 diabetes with neuropathy, pain due to onychomycosis   Plan: -Treatment options including alternatives, risks, complications were discussed -Hyperkeratotic lesion sharply debrided x2 without any complications or bleeding -Nails debrided without incident 1-5.  -Discussed daily foot inspection. If there are any changes, to call the office immediately.  -Follow-up in 9 weeks or sooner if any problems are to arise. In the meantime, encouraged to call the office with any questions, concerns, changes symptoms.   Lorenda Peck, DPM

## 2022-06-05 ENCOUNTER — Encounter: Payer: Self-pay | Admitting: Family Medicine

## 2022-06-15 ENCOUNTER — Encounter: Payer: Self-pay | Admitting: Family Medicine

## 2022-06-17 ENCOUNTER — Encounter: Payer: Self-pay | Admitting: Family Medicine

## 2022-06-17 ENCOUNTER — Ambulatory Visit (INDEPENDENT_AMBULATORY_CARE_PROVIDER_SITE_OTHER): Payer: Medicare HMO | Admitting: Family Medicine

## 2022-06-17 VITALS — BP 118/71 | HR 90 | Temp 97.7°F | Ht >= 80 in | Wt 286.4 lb

## 2022-06-17 DIAGNOSIS — H6991 Unspecified Eustachian tube disorder, right ear: Secondary | ICD-10-CM | POA: Diagnosis not present

## 2022-06-17 DIAGNOSIS — E669 Obesity, unspecified: Secondary | ICD-10-CM | POA: Diagnosis not present

## 2022-06-17 DIAGNOSIS — E1169 Type 2 diabetes mellitus with other specified complication: Secondary | ICD-10-CM

## 2022-06-17 LAB — HEMOGLOBIN A1C: Hgb A1c MFr Bld: 7 % — ABNORMAL HIGH (ref 4.6–6.5)

## 2022-06-17 MED ORDER — FLUTICASONE PROPIONATE 50 MCG/ACT NA SUSP: 2.0000 | Freq: Every day | NASAL | 6 refills | Status: DC

## 2022-06-17 MED ORDER — VALACYCLOVIR HCL 1 G PO TABS: ORAL_TABLET | ORAL | 1 refills | Status: AC

## 2022-06-17 NOTE — Progress Notes (Signed)
Subjective:   Chief Complaint  Patient presents with   Follow-up    Right ear pain Cold sore on lip    Ronald Barr is a 77 y.o. male here for follow-up of diabetes.   [facility] self monitored glucose range is mid 100's.  Patient denies hypoglycemic reactions. He checks his glucose levels 1-2  time(s) per day. Patient does require insulin.  Tresiba 17 units in the morning, 41 units in the evening. Medications include: Metformin 850 mg 3 times daily, glipizide XR 20 mg daily, Actos 30 mg daily. Diet is OK.  Exercise: None No Cp or SOB.   URI Duration: 2 weeks  Associated symptoms: sinus congestion and ear pain Denies: sinus pain, rhinorrhea, itchy watery eyes, ear drainage, sore throat, wheezing, shortness of breath, myalgia, and fevers Treatment to date: Advil Cold and Flu Sick contacts: No  Past Medical History:  Diagnosis Date   Cancer ([place])    prostate   Diabetes mellitus without complication ([place])    Essential hypertension 03/06/2016   GERD (gastroesophageal reflux disease)    Hyperlipidemia    Sleep apnea    sleeps with BPAP machine     Related testing: Retinal exam: Done Pneumovax: done  Objective:  BP 118/71 (BP Location: Left Arm, Patient Position: Sitting, Cuff Size: Normal)   Pulse 90   Temp 97.7 F (36.5 C) (Oral)   Ht '6\' 8"'$  (2.032 m)   Wt 286 lb 6 oz (129.9 kg)   SpO2 94%   BMI 31.46 kg/m  General:  Well developed, well nourished, in no apparent distress HEENT: PERRLA, nares patent w/o rhinorrhea, canals patent, TM's neg b/l, MMM, no pharyngeal exudate/erythema.  Lungs:  CTAB, no access msc use Cardio:  RRR, no bruits, no LE edema Psych: Age appropriate judgment and insight  Assessment:   Diabetes mellitus type 2 in obese ([place]) - Plan: Hemoglobin A1c  Dysfunction of right eustachian tube - Plan: fluticasone (FLONASE) 50 MCG/ACT nasal spray   Plan:   Chronic, unstable.  Continue glipizide 20 mg daily.  Continue Tresiba 41 units at night, 17  units in the morning, metformin 850 mg 3 times daily, Actos 30 mg daily.  Counseled on diet and exercise. If still not controlled, will either adjust insulin or increase Actos to 45 mg/d. INCS sent in for ETD.  F/u in 3-6 mo pending the above. The patient voiced understanding and agreement to the plan.  [place], DO 06/17/22 8:24 AM

## 2022-06-17 NOTE — Patient Instructions (Addendum)
Give [place] 2-3 business days to get the results of your labs back.   Keep the diet clean and stay active.  Flonase (fluticasone); nasal spray that is over the counter. 2 sprays each nostril, once daily. Aim towards the same side eye when you spray.  There is available OTC. Show this to a pharmacist if you have trouble finding any of these items.  Let us know if you need anything.

## 2022-06-18 ENCOUNTER — Encounter: Payer: Self-pay | Admitting: Podiatry

## 2022-07-31 ENCOUNTER — Ambulatory Visit: Payer: Medicare HMO | Admitting: Podiatry

## 2022-08-07 ENCOUNTER — Other Ambulatory Visit: Payer: Self-pay | Admitting: Family Medicine

## 2022-08-07 ENCOUNTER — Encounter: Payer: Self-pay | Admitting: Family Medicine

## 2022-08-10 ENCOUNTER — Other Ambulatory Visit: Payer: Self-pay | Admitting: Family Medicine

## 2022-08-10 MED ORDER — OZEMPIC (0.25 OR 0.5 MG/DOSE) 2 MG/1.5ML ~~LOC~~ SOPN: PEN_INJECTOR | SUBCUTANEOUS | 2 refills | Status: DC

## 2022-08-20 ENCOUNTER — Ambulatory Visit: Payer: Medicare HMO | Admitting: Podiatry

## 2022-08-20 DIAGNOSIS — M79675 Pain in left toe(s): Secondary | ICD-10-CM

## 2022-08-20 DIAGNOSIS — E1149 Type 2 diabetes mellitus with other diabetic neurological complication: Secondary | ICD-10-CM

## 2022-08-20 DIAGNOSIS — B351 Tinea unguium: Secondary | ICD-10-CM

## 2022-08-20 DIAGNOSIS — L84 Corns and callosities: Secondary | ICD-10-CM | POA: Diagnosis not present

## 2022-08-20 DIAGNOSIS — M79674 Pain in right toe(s): Secondary | ICD-10-CM

## 2022-08-20 NOTE — Progress Notes (Signed)
Subjective: 77 y.o. returns the office today for painful calluses to his feet that he will have trimmed the right side worse than left.  He has not seen any redness or swelling. Denies any open sores.  Denies any systemic complaints such as fevers, chills, nausea, vomiting.   PCP: Sharlene Dory, DO last seen 06/17/22 A1c: 7.0 on 06/17/22  Objective: AAO 3, NAD DP/PT pulses palpable, CRT less than 3 seconds Hyperkeratotic lesions present bilateral submetatarsal one and 5.  No underlying ulceration, drainage or any signs of infection.  Upon debridement of right submetatarsal 5 lesion some dried blood was present there is no ulcerations identified or signs of infection. Nails are minimally hypertrophic, dystrophic, brittle, discolored, elongated 10. No surrounding redness or drainage. Tenderness nails 1-5 bilaterally.  No pain otherwise the left foot in particular. No open lesions or pre-ulcerative lesions are identified. No pain with calf compression, swelling, warmth, erythema.  Assessment: Patient presents with symptomatic hyperkeratotic lesions;  type 2 diabetes with neuropathy, pain due to onychomycosis   Plan: -Treatment options including alternatives, risks, complications were discussed -Hyperkeratotic lesion sharply debrided x2 without any complications or bleeding -Discussed daily foot inspection. If there are any changes, to call the office immediately.  -Follow-up in 9 weeks or sooner if any problems are to arise. In the meantime, encouraged to call the office with any questions, concerns, changes symptoms.   Louann Sjogren, DPM

## 2022-08-31 ENCOUNTER — Other Ambulatory Visit: Payer: Self-pay | Admitting: Family Medicine

## 2022-09-11 ENCOUNTER — Other Ambulatory Visit: Payer: Self-pay | Admitting: Family Medicine

## 2022-09-14 ENCOUNTER — Telehealth: Payer: Self-pay

## 2022-09-14 ENCOUNTER — Encounter: Payer: Self-pay | Admitting: Family Medicine

## 2022-09-14 ENCOUNTER — Ambulatory Visit (INDEPENDENT_AMBULATORY_CARE_PROVIDER_SITE_OTHER): Payer: Medicare HMO | Admitting: Family Medicine

## 2022-09-14 VITALS — BP 120/78 | HR 90 | Temp 97.5°F | Ht >= 80 in | Wt 281.5 lb

## 2022-09-14 DIAGNOSIS — Z0000 Encounter for general adult medical examination without abnormal findings: Secondary | ICD-10-CM | POA: Diagnosis not present

## 2022-09-14 DIAGNOSIS — Z7985 Long-term (current) use of injectable non-insulin antidiabetic drugs: Secondary | ICD-10-CM

## 2022-09-14 DIAGNOSIS — Z13220 Encounter for screening for lipoid disorders: Secondary | ICD-10-CM

## 2022-09-14 DIAGNOSIS — E1149 Type 2 diabetes mellitus with other diabetic neurological complication: Secondary | ICD-10-CM

## 2022-09-14 LAB — COMPREHENSIVE METABOLIC PANEL
ALT: 19 U/L (ref 0–53)
AST: 24 U/L (ref 0–37)
Albumin: 4.4 g/dL (ref 3.5–5.2)
Alkaline Phosphatase: 39 U/L (ref 39–117)
BUN: 22 mg/dL (ref 6–23)
CO2: 27 mEq/L (ref 19–32)
Calcium: 9.8 mg/dL (ref 8.4–10.5)
Chloride: 103 mEq/L (ref 96–112)
Creatinine, Ser: 1.46 mg/dL (ref 0.40–1.50)
GFR: 46.72 mL/min — ABNORMAL LOW (ref >60.00)
Glucose, Bld: 78 mg/dL (ref 70–99)
Potassium: 4.9 mEq/L (ref 3.5–5.1)
Sodium: 141 mEq/L (ref 135–145)
Total Bilirubin: 0.4 mg/dL (ref 0.2–1.2)
Total Protein: 7.1 g/dL (ref 6.0–8.3)

## 2022-09-14 LAB — LIPID PANEL
Cholesterol: 150 mg/dL (ref 0–200)
HDL: 40.1 mg/dL (ref >39.00)
LDL Cholesterol: 89 mg/dL (ref 0–99)
NonHDL: 110.22
Total CHOL/HDL Ratio: 4
Triglycerides: 107 mg/dL (ref 0.0–149.0)
VLDL: 21.4 mg/dL (ref 0.0–40.0)

## 2022-09-14 LAB — CBC
HCT: 44.5 % (ref 39.0–52.0)
Hemoglobin: 14.6 g/dL (ref 13.0–17.0)
MCHC: 32.9 g/dL (ref 30.0–36.0)
MCV: 90.8 fl (ref 78.0–100.0)
Platelets: 235 10*3/uL (ref 150.0–400.0)
RBC: 4.9 Mil/uL (ref 4.22–5.81)
RDW: 14.7 % (ref 11.5–15.5)
WBC: 6.9 10*3/uL (ref 4.0–10.5)

## 2022-09-14 LAB — MICROALBUMIN / CREATININE URINE RATIO
Creatinine,U: 108.9 mg/dL
Microalb Creat Ratio: 3.2 mg/g (ref 0.0–30.0)
Microalb, Ur: 3.5 mg/dL — ABNORMAL HIGH (ref 0.0–1.9)

## 2022-09-14 LAB — HEMOGLOBIN A1C: Hgb A1c MFr Bld: 7.6 % — ABNORMAL HIGH (ref 4.6–6.5)

## 2022-09-14 MED ORDER — TIRZEPATIDE 10 MG/0.5ML ~~LOC~~ SOPN
10.0000 mg | PEN_INJECTOR | SUBCUTANEOUS | 0 refills | Status: AC
Start: 2022-10-19 — End: 2022-11-16

## 2022-09-14 MED ORDER — TIRZEPATIDE 12.5 MG/0.5ML ~~LOC~~ SOPN
12.5000 mg | PEN_INJECTOR | SUBCUTANEOUS | 0 refills | Status: AC
Start: 2022-11-16 — End: 2022-12-14

## 2022-09-14 MED ORDER — TIRZEPATIDE 15 MG/0.5ML ~~LOC~~ SOAJ
15.0000 mg | SUBCUTANEOUS | 1 refills | Status: DC
Start: 2022-12-14 — End: 2023-03-17

## 2022-09-14 MED ORDER — TIRZEPATIDE 7.5 MG/0.5ML ~~LOC~~ SOPN
7.5000 mg | PEN_INJECTOR | SUBCUTANEOUS | 0 refills | Status: AC
Start: 2022-09-21 — End: 2022-10-19

## 2022-09-14 NOTE — Telephone Encounter (Signed)
PA initiated via Covermymeds; KEY: [id number]. Awaiting determination.

## 2022-09-14 NOTE — Telephone Encounter (Signed)
PA approved.   PA Case: [id number], Status: Approved, Coverage Starts on: 05/11/2022 12:00:00 AM, Coverage Ends on: 05/11/2023 12:00:00 AM. Questions? Contact [phone]. Authorization Expiration Date: 05/10/2023

## 2022-09-14 NOTE — Patient Instructions (Signed)
Give us 2-3 business days to get the results of your labs back.   Keep the diet clean and stay active.  Please get me a copy of your advanced directive form at your convenience.   Let us know if you need anything.  

## 2022-09-14 NOTE — Telephone Encounter (Signed)
Called left message of PA approval.

## 2022-09-14 NOTE — Progress Notes (Signed)
Chief Complaint  Patient presents with   Annual Exam    Well Male Ronald Barr is here for a complete physical.   His last physical was >1 year ago.  Current diet: in general, a "healthy" diet.   Current exercise: active in yard Weight trend: stable Fatigue out of ordinary? No. Seat belt? Yes.   Advanced directive? Yes  Health maintenance Shingrix- No Colonoscopy- Yes Tetanus- Yes Hep C- Yes Pneumonia vaccine- Yes   Past Medical History:  Diagnosis Date   Cancer (HCC)    prostate   Diabetes mellitus without complication (HCC)    Essential hypertension 03/06/2016   GERD (gastroesophageal reflux disease)    Hyperlipidemia    Sleep apnea    sleeps with BPAP machine     Past Surgical History:  Procedure Laterality Date   nose and throat surgery     sleep apnes   SHOULDER SURGERY     TONSILLECTOMY      Medications  Current Outpatient Medications on File Prior to Visit  Medication Sig Dispense Refill   Accu-Chek Softclix Lancets lancets Check blood sugars once daily 200 each 12   amLODipine (NORVASC) 10 MG tablet TAKE 1 TABLET BY MOUTH DAILY 90 tablet 1   cyclobenzaprine (FLEXERIL) 10 MG tablet TAKE 1 TABLET BY MOUTH AT BEDTIME 90 tablet 2   DROPLET PEN NEEDLES 32G X 6 MM MISC USE 1 NEEDLE 2 TIMES A DAY 100 each 0   enalapril (VASOTEC) 10 MG tablet Take 1 tablet (10 mg total) by mouth daily. 90 tablet 2   fenofibrate (TRICOR) 145 MG tablet TAKE 1 TABLET BY MOUTH DAILY 90 tablet 1   glipiZIDE (GLUCOTROL XL) 10 MG 24 hr tablet TAKE 2 TABLETS BY MOUTH DAILY 180 tablet 1   glucose blood (ACCU-CHEK GUIDE) test strip Use twice daily to check blood sugar.  DX E11.69 200 strip 1   Insulin Degludec FlexTouch 100 UNIT/ML SOPN INJECT 41 UNITS AT BEDTIME AND INJECT 17 UNITS IN THE MORNING 15 mL 1   IRON PO Take 1 tablet by mouth daily.     MAGNESIUM PO Take 1 tablet by mouth 2 (two) times daily at 10 AM and 5 PM.     metFORMIN (GLUCOPHAGE) 850 MG tablet TAKE 1 TABLET BY MOUTH  3 TIMES A DAY WITH BREAKFAST, LUNCH AND EVENING MEAL 270 tablet 1   Multiple Vitamins-Minerals (MULTIVITAMIN PO) Take 1 tablet by mouth daily.     omeprazole (PRILOSEC) 20 MG capsule TAKE ONE CAPSULE BY MOUTH DAILY 90 capsule 3   pioglitazone (ACTOS) 30 MG tablet TAKE 1 TABLET BY MOUTH DAILY 90 tablet 1   pravastatin (PRAVACHOL) 10 MG tablet TAKE 1 TABLET BY MOUTH DAILY 90 tablet 1   valACYclovir (VALTREX) 1000 MG tablet Take 2 tabs and repeat in 12 hours in event of an outbreak. 30 tablet 1    Allergies Allergies  Allergen Reactions   Statins     Cramping, intolerant of Crestor and Pravastatin   Sulfa Antibiotics     Family History Family History  Problem Relation Age of Onset   Hypertension Mother    Hypertension Father     Review of Systems: Constitutional:  no fevers Eye:  no recent significant change in vision Ears:  No changes in hearing Nose/Mouth/Throat:  no complaints of nasal congestion, no sore throat Cardiovascular: no chest pain Respiratory:  No shortness of breath Gastrointestinal:  No change in bowel habits GU:  No frequency Integumentary:  no abnormal skin  lesions reported Neurologic:  no headaches Endocrine:  denies unexplained weight changes  Exam BP 120/78 (BP Location: Left Arm, Patient Position: Sitting, Cuff Size: Large)   Pulse 90   Temp (!) 97.5 F (36.4 C) (Oral)   Ht 6\' 8"  (2.032 m)   Wt 281 lb 8 oz (127.7 kg)   SpO2 95%   BMI 30.92 kg/m  General:  well developed, well nourished, in no apparent distress Skin:  no significant moles, warts, or growths Head:  no masses, lesions, or tenderness Eyes:  pupils equal and round, sclera anicteric without injection Ears:  canals without lesions, TMs shiny without retraction, no obvious effusion, no erythema Nose:  nares patent, mucosa normal Throat/Pharynx:  lips and gingiva without lesion; tongue and uvula midline; non-inflamed pharynx; no exudates or postnasal drainage Lungs:  clear to  auscultation, breath sounds equal bilaterally, no respiratory distress Cardio:  regular rate and rhythm, no LE edema or bruits Rectal: Deferred GI: BS+, S, NT, ND, no masses or organomegaly Musculoskeletal:  symmetrical muscle groups noted without atrophy or deformity Neuro:  gait normal; deep tendon reflexes normal and symmetric; decreased sensation to pinprick over bilateral feet Psych: well oriented with normal range of affect and appropriate judgment/insight  Assessment and Plan  Well adult exam  Type II diabetes mellitus with neurological manifestations (HCC) - Plan: Comprehensive metabolic panel, Lipid panel, Hemoglobin A1c, CBC, Microalbumin / creatinine urine ratio, tirzepatide (MOUNJARO) 7.5 MG/0.5ML Pen, tirzepatide (MOUNJARO) 10 MG/0.5ML Pen, tirzepatide (MOUNJARO) 12.5 MG/0.5ML Pen, tirzepatide (MOUNJARO) 15 MG/0.5ML Pen   Well 77 y.o. male. Counseled on diet and exercise. Other orders as above. Advanced directive form requested today.  Shingrix recommended. Follow up in 6 months for a DM visit.   The patient voiced understanding and agreement to the plan.  Jilda Roche [place], DO 09/14/22 1:19 PM

## 2022-09-16 ENCOUNTER — Encounter: Payer: Self-pay | Admitting: Family Medicine

## 2022-09-16 ENCOUNTER — Other Ambulatory Visit: Payer: Self-pay | Admitting: Family Medicine

## 2022-09-16 DIAGNOSIS — E1149 Type 2 diabetes mellitus with other diabetic neurological complication: Secondary | ICD-10-CM

## 2022-10-04 ENCOUNTER — Other Ambulatory Visit: Payer: Self-pay | Admitting: Family Medicine

## 2022-10-04 DIAGNOSIS — E119 Type 2 diabetes mellitus without complications: Secondary | ICD-10-CM

## 2022-10-19 ENCOUNTER — Other Ambulatory Visit: Payer: Self-pay | Admitting: Family Medicine

## 2022-10-20 ENCOUNTER — Other Ambulatory Visit: Payer: Self-pay | Admitting: Family Medicine

## 2022-10-20 MED ORDER — PIOGLITAZONE HCL 30 MG PO TABS: 30.0000 mg | ORAL_TABLET | Freq: Every day | ORAL | 1 refills | Status: DC

## 2022-10-21 ENCOUNTER — Other Ambulatory Visit: Payer: Self-pay | Admitting: Family Medicine

## 2022-10-22 ENCOUNTER — Encounter: Payer: Self-pay | Admitting: Podiatry

## 2022-10-22 ENCOUNTER — Ambulatory Visit: Payer: Medicare HMO | Admitting: Podiatry

## 2022-10-22 DIAGNOSIS — L84 Corns and callosities: Secondary | ICD-10-CM

## 2022-10-22 DIAGNOSIS — E1149 Type 2 diabetes mellitus with other diabetic neurological complication: Secondary | ICD-10-CM

## 2022-10-22 NOTE — Progress Notes (Signed)
Subjective: 77 y.o. returns the office today for painful calluses to his feet that he will have trimmed the right side worse than left.  He has not seen any redness or swelling. Denies any open sores.  Denies any systemic complaints such as fevers, chills, nausea, vomiting.   PCP: Sharlene Dory, DO last seen 06/17/22 A1c: 7.0 on 06/17/22  Objective: AAO 3, NAD DP/PT pulses palpable, CRT less than 3 seconds Hyperkeratotic lesions present bilateral submetatarsal one and 5.  No underlying ulceration, drainage or any signs of infection.  Upon debridement of right submetatarsal 5 lesion some dried blood was present there is no ulcerations identified or signs of infection. Nails are minimally hypertrophic, dystrophic, brittle, discolored, elongated 10. No surrounding redness or drainage. Tenderness nails 1-5 bilaterally.  No pain otherwise the left foot in particular. No open lesions or pre-ulcerative lesions are identified. No pain with calf compression, swelling, warmth, erythema.  Assessment: Patient presents with symptomatic hyperkeratotic lesions;  type 2 diabetes with neuropathy, pain due to onychomycosis   Plan: -Treatment options including alternatives, risks, complications were discussed -Hyperkeratotic lesion sharply debrided x4 without any complications or bleeding -Discussed daily foot inspection. If there are any changes, to call the office immediately.  -Follow-up in 9 weeks or sooner if any problems are to arise. In the meantime, encouraged to call the office with any questions, concerns, changes symptoms.   Louann Sjogren, DPM

## 2022-10-31 ENCOUNTER — Other Ambulatory Visit: Payer: Self-pay | Admitting: Family Medicine

## 2022-11-15 ENCOUNTER — Other Ambulatory Visit: Payer: Self-pay | Admitting: Family Medicine

## 2022-11-15 DIAGNOSIS — E119 Type 2 diabetes mellitus without complications: Secondary | ICD-10-CM

## 2022-12-08 ENCOUNTER — Other Ambulatory Visit: Payer: Self-pay | Admitting: Family Medicine

## 2022-12-08 MED ORDER — DROPLET PEN NEEDLES 32G X 6 MM MISC: 99 refills | Status: DC

## 2022-12-10 DIAGNOSIS — Z961 Presence of intraocular lens: Secondary | ICD-10-CM | POA: Diagnosis not present

## 2022-12-10 DIAGNOSIS — H33002 Unspecified retinal detachment with retinal break, left eye: Secondary | ICD-10-CM | POA: Diagnosis not present

## 2022-12-10 DIAGNOSIS — H35033 Hypertensive retinopathy, bilateral: Secondary | ICD-10-CM | POA: Diagnosis not present

## 2022-12-10 LAB — HM DIABETES EYE EXAM

## 2022-12-11 DIAGNOSIS — H33312 Horseshoe tear of retina without detachment, left eye: Secondary | ICD-10-CM | POA: Diagnosis not present

## 2022-12-11 DIAGNOSIS — H35371 Puckering of macula, right eye: Secondary | ICD-10-CM | POA: Diagnosis not present

## 2022-12-11 DIAGNOSIS — H33012 Retinal detachment with single break, left eye: Secondary | ICD-10-CM | POA: Diagnosis not present

## 2022-12-11 DIAGNOSIS — H53132 Sudden visual loss, left eye: Secondary | ICD-10-CM | POA: Diagnosis not present

## 2022-12-11 DIAGNOSIS — H43392 Other vitreous opacities, left eye: Secondary | ICD-10-CM | POA: Diagnosis not present

## 2022-12-11 DIAGNOSIS — H33022 Retinal detachment with multiple breaks, left eye: Secondary | ICD-10-CM | POA: Diagnosis not present

## 2022-12-11 DIAGNOSIS — H53452 Other localized visual field defect, left eye: Secondary | ICD-10-CM | POA: Diagnosis not present

## 2022-12-15 ENCOUNTER — Encounter: Payer: Self-pay | Admitting: Family Medicine

## 2022-12-15 MED ORDER — PRAVASTATIN SODIUM 10 MG PO TABS: 10.0000 mg | ORAL_TABLET | Freq: Every day | ORAL | 1 refills | Status: DC

## 2022-12-18 ENCOUNTER — Other Ambulatory Visit: Payer: Self-pay | Admitting: Family Medicine

## 2022-12-24 ENCOUNTER — Ambulatory Visit (INDEPENDENT_AMBULATORY_CARE_PROVIDER_SITE_OTHER): Payer: Medicare HMO | Admitting: Podiatry

## 2022-12-24 DIAGNOSIS — Z91199 Patient's noncompliance with other medical treatment and regimen due to unspecified reason: Secondary | ICD-10-CM

## 2022-12-24 NOTE — Progress Notes (Signed)
No show

## 2022-12-26 ENCOUNTER — Other Ambulatory Visit: Payer: Self-pay | Admitting: Family Medicine

## 2023-01-07 ENCOUNTER — Encounter: Payer: Self-pay | Admitting: Podiatry

## 2023-01-07 ENCOUNTER — Ambulatory Visit: Payer: Medicare HMO | Admitting: Podiatry

## 2023-01-07 DIAGNOSIS — E1149 Type 2 diabetes mellitus with other diabetic neurological complication: Secondary | ICD-10-CM | POA: Diagnosis not present

## 2023-01-07 DIAGNOSIS — L84 Corns and callosities: Secondary | ICD-10-CM

## 2023-01-07 NOTE — Progress Notes (Signed)
Subjective: 77 y.o. returns the office today for painful calluses to his feet that he will have trimmed the right side worse than left.  He has not seen any redness or swelling. Denies any open sores.  Denies any systemic complaints such as fevers, chills, nausea, vomiting.   PCP: Sharlene Dory, DO last seen 09/14/22 A1c: 7.6 on 09/14/22  Objective: AAO 3, NAD DP/PT pulses palpable, CRT less than 3 seconds Hyperkeratotic lesions present bilateral submetatarsal one and 5.  No underlying ulceration, drainage or any signs of infection.  Upon debridement of right submetatarsal 5 lesion some dried blood was present there is no ulcerations identified or signs of infection. Nails are minimally hypertrophic, dystrophic, brittle, discolored, elongated 10. No surrounding redness or drainage. Tenderness nails 1-5 bilaterally.  No pain otherwise the left foot in particular. No open lesions or pre-ulcerative lesions are identified. No pain with calf compression, swelling, warmth, erythema.  Assessment: Patient presents with symptomatic hyperkeratotic lesions;  type 2 diabetes with neuropathy, pain due to onychomycosis   Plan: -Treatment options including alternatives, risks, complications were discussed -Hyperkeratotic lesion sharply debrided x4 without any complications or bleeding -Discussed daily foot inspection. If there are any changes, to call the office immediately.  -Follow-up in 9 weeks or sooner if any problems are to arise. In the meantime, encouraged to call the office with any questions, concerns, changes symptoms.   Louann Sjogren, DPM

## 2023-01-19 ENCOUNTER — Other Ambulatory Visit: Payer: Self-pay | Admitting: Family Medicine

## 2023-01-27 DIAGNOSIS — H3589 Other specified retinal disorders: Secondary | ICD-10-CM | POA: Diagnosis not present

## 2023-01-27 DIAGNOSIS — H33022 Retinal detachment with multiple breaks, left eye: Secondary | ICD-10-CM | POA: Diagnosis not present

## 2023-01-29 ENCOUNTER — Ambulatory Visit: Payer: Medicare HMO | Admitting: Podiatry

## 2023-02-06 ENCOUNTER — Other Ambulatory Visit: Payer: Self-pay | Admitting: Family Medicine

## 2023-03-11 ENCOUNTER — Ambulatory Visit: Payer: Medicare HMO | Admitting: Podiatry

## 2023-03-12 ENCOUNTER — Ambulatory Visit: Payer: Medicare HMO | Admitting: Podiatry

## 2023-03-17 ENCOUNTER — Encounter: Payer: Self-pay | Admitting: Family Medicine

## 2023-03-17 ENCOUNTER — Ambulatory Visit (INDEPENDENT_AMBULATORY_CARE_PROVIDER_SITE_OTHER): Payer: Medicare HMO | Admitting: Family Medicine

## 2023-03-17 VITALS — BP 122/64 | HR 84 | Temp 97.8°F | Resp 16 | Ht >= 80 in | Wt 294.4 lb

## 2023-03-17 DIAGNOSIS — Z23 Encounter for immunization: Secondary | ICD-10-CM | POA: Diagnosis not present

## 2023-03-17 DIAGNOSIS — E1169 Type 2 diabetes mellitus with other specified complication: Secondary | ICD-10-CM

## 2023-03-17 DIAGNOSIS — E669 Obesity, unspecified: Secondary | ICD-10-CM | POA: Diagnosis not present

## 2023-03-17 DIAGNOSIS — Z7984 Long term (current) use of oral hypoglycemic drugs: Secondary | ICD-10-CM | POA: Diagnosis not present

## 2023-03-17 DIAGNOSIS — E782 Mixed hyperlipidemia: Secondary | ICD-10-CM

## 2023-03-17 DIAGNOSIS — I10 Essential (primary) hypertension: Secondary | ICD-10-CM

## 2023-03-17 LAB — COMPREHENSIVE METABOLIC PANEL
ALT: 19 U/L (ref 0–53)
AST: 23 U/L (ref 0–37)
Albumin: 4.4 g/dL (ref 3.5–5.2)
Alkaline Phosphatase: 37 U/L — ABNORMAL LOW (ref 39–117)
BUN: 21 mg/dL (ref 6–23)
CO2: 31 meq/L (ref 19–32)
Calcium: 9.9 mg/dL (ref 8.4–10.5)
Chloride: 103 meq/L (ref 96–112)
Creatinine, Ser: 1.37 mg/dL (ref 0.40–1.50)
GFR: 50.25 mL/min — ABNORMAL LOW (ref >60.00)
Glucose, Bld: 80 mg/dL (ref 70–99)
Potassium: 4.6 meq/L (ref 3.5–5.1)
Sodium: 141 meq/L (ref 135–145)
Total Bilirubin: 0.5 mg/dL (ref 0.2–1.2)
Total Protein: 7 g/dL (ref 6.0–8.3)

## 2023-03-17 LAB — LIPID PANEL
Cholesterol: 154 mg/dL (ref 0–200)
HDL: 44 mg/dL (ref >39.00)
LDL Cholesterol: 96 mg/dL (ref 0–99)
NonHDL: 109.73
Total CHOL/HDL Ratio: 3
Triglycerides: 69 mg/dL (ref 0.0–149.0)
VLDL: 13.8 mg/dL (ref 0.0–40.0)

## 2023-03-17 LAB — HEMOGLOBIN A1C: Hgb A1c MFr Bld: 7.5 % — ABNORMAL HIGH (ref 4.6–6.5)

## 2023-03-17 NOTE — Progress Notes (Signed)
Subjective:   Chief Complaint  Patient presents with   Follow-up    Follow up    Ronald Barr is a 77 y.o. male here for follow-up of diabetes.   Ronald Barr's self monitored glucose range is 80-90's fasting, mid 100's non fasting.  Patient denies hypoglycemic reactions. He checks his glucose levels 1 time(s) per day. Patient does require insulin.  Tresiba 41 u qhs, 17 u in AM Medications include: Metformin 850 mg TID, Glipizide XL 20 mg/d, Actos 30 mg/d Diet is OK.  Exercise: none  Hypertension Patient presents for hypertension follow up. He does not monitor home blood pressures. He is compliant with medications- enalapril 10 mg/d, Norvasc 10 mg/d. Patient has these side effects of medication: none Diet/exercise as above.  No CP or SOB.   Mixed Hyperlipidemia Patient presents for mixed hyperlipidemia follow up. Currently being treated with pravastatin 10 mg/d and compliance with treatment thus far has been good. He denies myalgias. Diet/exercise as above.  The patient is not known to have coexisting coronary artery disease.  Past Medical History:  Diagnosis Date   Cancer [facility])    prostate   Diabetes mellitus without complication (HCC)    Essential hypertension 03/06/2016   GERD (gastroesophageal reflux disease)    Hyperlipidemia    Sleep apnea    sleeps with BPAP machine     Related testing: Retinal exam: Done Pneumovax: done  Objective:  BP 122/64 (BP Location: Left Arm, Patient Position: Sitting, Cuff Size: Normal)   Pulse 84   Temp 97.8 F (36.6 C) (Oral)   Resp 16   Ht 6\' 8"  (2.032 m)   Wt 294 lb 6.4 oz (133.5 kg)   SpO2 96%   BMI 32.34 kg/m  General:  Well developed, well nourished, in no apparent distress Skin:  Warm, no pallor or diaphoresis Lungs:  CTAB, no access msc use Cardio:  RRR, no bruits, no LE edema Psych: Age appropriate judgment and insight  Assessment:   Type 2 diabetes mellitus with obesity (HCC) - Plan: Comprehensive metabolic  panel, Lipid panel, Hemoglobin A1c  Essential hypertension  Mixed hyperlipidemia   Plan:   Chronic, stable.  Continue Actos 30 mg daily, metformin 850 mg 3 times daily, glipizide XL 20 mg daily, Tresiba 17 units in the morning, 41 units at bedtime.  Counseled on diet and exercise. Chronic, stable.  Continue Norvasc 10 mg daily, enalapril 20 mg daily. Chronic, stable.  Continue fenofibrate 145 mg daily, pravastatin 10 mg daily. Shingrix recommended.  Flu shot today. F/u in 6 mo. The patient voiced understanding and agreement to the plan.  Jilda Roche [place], DO 03/17/23 9:24 AM

## 2023-03-17 NOTE — Addendum Note (Signed)
Addended by: Kathi Ludwig on: 03/17/2023 09:37 AM   Modules accepted: Orders

## 2023-03-17 NOTE — Patient Instructions (Addendum)
Give us 2-3 business days to get the results of your labs back.  ? ?Keep the diet clean and stay active. ? ?The Shingrix vaccine (for shingles) is a 2 shot series spaced 2-6 months apart. It can make people feel low energy, achy and almost like they have the flu for 48 hours after injection. 1/5 people can have nausea and/or vomiting. Please plan accordingly when deciding on when to get this shot. Call your pharmacy to get this. The second shot of the series is less severe regarding the side effects, but it still lasts 48 hours.  ? ?Let us know if you need anything. ?

## 2023-03-19 ENCOUNTER — Encounter: Payer: Self-pay | Admitting: Podiatry

## 2023-03-19 ENCOUNTER — Ambulatory Visit: Payer: Medicare HMO | Admitting: Podiatry

## 2023-03-19 DIAGNOSIS — M79675 Pain in left toe(s): Secondary | ICD-10-CM

## 2023-03-19 DIAGNOSIS — L84 Corns and callosities: Secondary | ICD-10-CM

## 2023-03-19 DIAGNOSIS — M79674 Pain in right toe(s): Secondary | ICD-10-CM | POA: Diagnosis not present

## 2023-03-19 DIAGNOSIS — B351 Tinea unguium: Secondary | ICD-10-CM | POA: Diagnosis not present

## 2023-03-19 DIAGNOSIS — E1149 Type 2 diabetes mellitus with other diabetic neurological complication: Secondary | ICD-10-CM

## 2023-03-19 NOTE — Progress Notes (Signed)
Subjective: 77 y.o. returns the office today for painful calluses to his feet that he will have trimmed the right side worse than left.  He has not seen any redness or swelling. Denies any open sores.  Denies any systemic complaints such as fevers, chills, nausea, vomiting.   PCP: Sharlene Dory, DO last seen 09/14/22 A1c: 7.5. on 03/17/23  Objective: AAO 3, NAD DP/PT pulses palpable, CRT less than 3 seconds Hyperkeratotic lesions present bilateral submetatarsal one and 5.  No underlying ulceration, drainage or any signs of infection.  Upon debridement of right submetatarsal 5 lesion some dried blood was present there is no ulcerations identified or signs of infection. Nails are minimally hypertrophic, dystrophic, brittle, discolored, elongated 10. No surrounding redness or drainage. Tenderness nails 1-5 bilaterally.  No pain otherwise the left foot in particular. No open lesions or pre-ulcerative lesions are identified. No pain with calf compression, swelling, warmth, erythema.  Assessment: Patient presents with symptomatic hyperkeratotic lesions;  type 2 diabetes with neuropathy, pain due to onychomycosis   Plan: -Treatment options including alternatives, risks, complications were discussed -Hyperkeratotic lesion sharply debrided x4 without any complications or bleeding -Discussed daily foot inspection. If there are any changes, to call the office immediately.  -Follow-up in 9 weeks or sooner if any problems are to arise. In the meantime, encouraged to call the office with any questions, concerns, changes symptoms.   Louann Sjogren, DPM

## 2023-03-28 ENCOUNTER — Other Ambulatory Visit: Payer: Self-pay | Admitting: Family Medicine

## 2023-04-01 ENCOUNTER — Other Ambulatory Visit: Payer: Self-pay | Admitting: Family Medicine

## 2023-04-01 DIAGNOSIS — E119 Type 2 diabetes mellitus without complications: Secondary | ICD-10-CM

## 2023-05-07 ENCOUNTER — Ambulatory Visit: Payer: Medicare HMO | Admitting: Podiatry

## 2023-05-07 DIAGNOSIS — L84 Corns and callosities: Secondary | ICD-10-CM

## 2023-05-07 DIAGNOSIS — E1149 Type 2 diabetes mellitus with other diabetic neurological complication: Secondary | ICD-10-CM

## 2023-05-07 NOTE — Progress Notes (Signed)
 Subjective: 77 y.o. returns the office today for painful calluses to his feet that he will have trimmed the right side worse than left.  He has not seen any redness or swelling. Denies any open sores.  Denies any systemic complaints such as fevers, chills, nausea, vomiting.   PCP: Sharlene Dory, DO last seen 09/14/22 A1c: 7.5. on 03/17/23  Objective: AAO 3, NAD DP/PT pulses palpable, CRT less than 3 seconds Hyperkeratotic lesions present bilateral submetatarsal one and 5.  No underlying ulceration, drainage or any signs of infection.  Upon debridement of right submetatarsal 5 lesion some dried blood was present there is no ulcerations identified or signs of infection. Nails are minimally hypertrophic, dystrophic, brittle, discolored, elongated 10. No surrounding redness or drainage. Tenderness nails 1-5 bilaterally.  No pain otherwise the left foot in particular. No open lesions or pre-ulcerative lesions are identified. No pain with calf compression, swelling, warmth, erythema.  Assessment: Patient presents with symptomatic hyperkeratotic lesions;  type 2 diabetes with neuropathy, pain due to onychomycosis   Plan: -Treatment options including alternatives, risks, complications were discussed -Hyperkeratotic lesion sharply debrided x4 without any complications or bleeding -Discussed daily foot inspection. If there are any changes, to call the office immediately.  -Follow-up in 9 weeks or sooner if any problems are to arise. In the meantime, encouraged to call the office with any questions, concerns, changes symptoms.   Louann Sjogren, DPM

## 2023-05-17 ENCOUNTER — Other Ambulatory Visit: Payer: Self-pay | Admitting: Family Medicine

## 2023-05-17 DIAGNOSIS — K219 Gastro-esophageal reflux disease without esophagitis: Secondary | ICD-10-CM

## 2023-05-17 DIAGNOSIS — E119 Type 2 diabetes mellitus without complications: Secondary | ICD-10-CM

## 2023-05-21 ENCOUNTER — Ambulatory Visit: Payer: Medicare HMO | Admitting: Podiatry

## 2023-05-26 ENCOUNTER — Telehealth: Payer: Self-pay | Admitting: Family Medicine

## 2023-05-26 DIAGNOSIS — Z8669 Personal history of other diseases of the nervous system and sense organs: Secondary | ICD-10-CM | POA: Diagnosis not present

## 2023-05-26 DIAGNOSIS — H35373 Puckering of macula, bilateral: Secondary | ICD-10-CM | POA: Diagnosis not present

## 2023-05-26 DIAGNOSIS — H43391 Other vitreous opacities, right eye: Secondary | ICD-10-CM | POA: Diagnosis not present

## 2023-05-26 DIAGNOSIS — H35341 Macular cyst, hole, or pseudohole, right eye: Secondary | ICD-10-CM | POA: Diagnosis not present

## 2023-05-26 NOTE — Telephone Encounter (Signed)
 Copied from CRM [id number]. Topic: Medicare AWV >> May 26, 2023  3:06 PM Juliana Ocean wrote: Reason for CRM: Called LVM 05/26/2023 to schedule AWV. Please schedule Virtual or Telehealth visits ONLY.   Rosalee Collins; Care Guide Ambulatory Clinical Support [place] [facility] Direct Dial: [phone]

## 2023-06-08 ENCOUNTER — Other Ambulatory Visit: Payer: Self-pay | Admitting: Family Medicine

## 2023-06-09 ENCOUNTER — Other Ambulatory Visit: Payer: Self-pay

## 2023-06-15 ENCOUNTER — Telehealth: Payer: Self-pay | Admitting: Family Medicine

## 2023-06-15 NOTE — Telephone Encounter (Signed)
 Copied from CRM [id number]. Topic: Clinical - Prescription Issue >> Jun 15, 2023 10:18 AM Joanell NOVAK wrote: Reason for CRM: Roena called from [facility] and stated that a diagnosis Code is needed for the refill request of medication Accu-Chek Softclix Lancets lancets. Callback number is [phone] Fax# [phone]

## 2023-06-23 ENCOUNTER — Other Ambulatory Visit: Payer: Self-pay

## 2023-06-23 MED ORDER — ACCU-CHEK GUIDE TEST VI STRP: ORAL_STRIP | 12 refills | Status: AC

## 2023-06-27 ENCOUNTER — Other Ambulatory Visit: Payer: Self-pay | Admitting: Family Medicine

## 2023-07-09 ENCOUNTER — Ambulatory Visit: Payer: Medicare HMO | Admitting: Podiatry

## 2023-07-22 ENCOUNTER — Ambulatory Visit: Payer: Medicare HMO | Admitting: Podiatry

## 2023-07-22 ENCOUNTER — Encounter: Payer: Self-pay | Admitting: Podiatry

## 2023-07-22 DIAGNOSIS — L84 Corns and callosities: Secondary | ICD-10-CM

## 2023-07-22 DIAGNOSIS — E1149 Type 2 diabetes mellitus with other diabetic neurological complication: Secondary | ICD-10-CM | POA: Diagnosis not present

## 2023-07-22 NOTE — Progress Notes (Signed)
 Subjective: 78 y.o. returns the office today for painful calluses to his feet that he will have trimmed the right side worse than left.  He has not seen any redness or swelling. Denies any open sores.  Denies any systemic complaints such as fevers, chills, nausea, vomiting.   PCP: Sharlene Dory, DO last seen 09/14/22 A1c: 7.5. on 03/17/23  Objective: AAO 3, NAD DP/PT pulses palpable, CRT less than 3 seconds Hyperkeratotic lesions present bilateral submetatarsal one and 5.  No underlying ulceration, drainage or any signs of infection.  Upon debridement of right submetatarsal 5 lesion some dried blood was present there is no ulcerations identified or signs of infection. Nails are minimally hypertrophic, dystrophic, brittle, discolored, elongated 10. No surrounding redness or drainage. Tenderness nails 1-5 bilaterally.  No pain otherwise the left foot in particular. No open lesions or pre-ulcerative lesions are identified. No pain with calf compression, swelling, warmth, erythema.  Assessment: Patient presents with symptomatic hyperkeratotic lesions;  type 2 diabetes with neuropathy, pain due to onychomycosis   Plan: -Treatment options including alternatives, risks, complications were discussed -Hyperkeratotic lesion sharply debrided x4 without any complications or bleeding -Discussed daily foot inspection. If there are any changes, to call the office immediately.  -Follow-up in 9 weeks or sooner if any problems are to arise. In the meantime, encouraged to call the office with any questions, concerns, changes symptoms.   Louann Sjogren, DPM

## 2023-07-30 ENCOUNTER — Encounter: Payer: Self-pay | Admitting: Family Medicine

## 2023-08-02 ENCOUNTER — Other Ambulatory Visit: Payer: Self-pay | Admitting: Family Medicine

## 2023-08-02 MED ORDER — OZEMPIC (0.25 OR 0.5 MG/DOSE) 2 MG/1.5ML ~~LOC~~ SOPN: PEN_INJECTOR | SUBCUTANEOUS | 1 refills | Status: AC

## 2023-08-09 ENCOUNTER — Telehealth: Payer: Self-pay | Admitting: Family Medicine

## 2023-08-09 NOTE — Telephone Encounter (Signed)
 Copied from CRM [id number]. Topic: General - Other >> Aug 09, 2023 10:49 AM Elmarie Shiley S wrote: Reason for CRM: Shanda Bumps from [facility] lab calling to see if fax was received please follow up  [phone]

## 2023-08-11 NOTE — Telephone Encounter (Signed)
 Shanda Bumps w./ [facility] call was returned and she stated test send out to patient already.

## 2023-09-06 ENCOUNTER — Other Ambulatory Visit: Payer: Self-pay | Admitting: Family Medicine

## 2023-09-09 ENCOUNTER — Telehealth: Payer: Self-pay | Admitting: Neurology

## 2023-09-09 NOTE — Telephone Encounter (Signed)
 Copied from CRM [id number]. Topic: Referral - Question >> Sep 09, 2023  9:50 AM Earnestine Goes B wrote: Reason for CRM: [facility] called to advise they have not been able to contact pt regarding cancer, pharmaco testing. They are requesting provider to reach out to the pt to see it he still wants the test.

## 2023-09-10 ENCOUNTER — Other Ambulatory Visit: Payer: Self-pay | Admitting: Family Medicine

## 2023-09-10 NOTE — Telephone Encounter (Signed)
 Called pt and pt wife, LVM about [facility] if he still wanting it done.To call our office back let us  know/

## 2023-09-18 ENCOUNTER — Other Ambulatory Visit: Payer: Self-pay | Admitting: Family Medicine

## 2023-09-25 ENCOUNTER — Other Ambulatory Visit: Payer: Self-pay | Admitting: Family Medicine

## 2023-09-25 DIAGNOSIS — E119 Type 2 diabetes mellitus without complications: Secondary | ICD-10-CM

## 2023-09-28 ENCOUNTER — Telehealth: Payer: Self-pay | Admitting: Family Medicine

## 2023-09-28 NOTE — Telephone Encounter (Signed)
 Copied from CRM [id number]. Topic: Medicare AWV >> Sep 28, 2023  9:30 AM Juliana Ocean wrote: Reason for CRM: LVM 09/28/2023 to schedule AWV. Please schedule Virtual or Telehealth visits ONLY  Rosalee Collins; Care Guide Ambulatory Clinical Support [place] [facility] Direct Dial: [phone]

## 2023-10-07 ENCOUNTER — Ambulatory Visit: Admitting: Podiatry

## 2023-10-21 ENCOUNTER — Ambulatory Visit: Admitting: Podiatry

## 2023-10-23 ENCOUNTER — Encounter: Payer: Self-pay | Admitting: Family Medicine

## 2023-10-28 ENCOUNTER — Encounter: Payer: Self-pay | Admitting: Podiatry

## 2023-10-28 ENCOUNTER — Ambulatory Visit: Admitting: Podiatry

## 2023-10-28 DIAGNOSIS — E1149 Type 2 diabetes mellitus with other diabetic neurological complication: Secondary | ICD-10-CM

## 2023-10-28 DIAGNOSIS — B351 Tinea unguium: Secondary | ICD-10-CM | POA: Diagnosis not present

## 2023-10-28 DIAGNOSIS — L84 Corns and callosities: Secondary | ICD-10-CM | POA: Diagnosis not present

## 2023-10-28 DIAGNOSIS — M79674 Pain in right toe(s): Secondary | ICD-10-CM

## 2023-10-28 DIAGNOSIS — M79675 Pain in left toe(s): Secondary | ICD-10-CM

## 2023-10-28 NOTE — Progress Notes (Signed)
 Subjective: 78 y.o. returns the office today for painful calluses to his feet that he will have trimmed the right side worse than left.  He has not seen any redness or swelling. Denies any open sores.  Denies any systemic complaints such as fevers, chills, nausea, vomiting.   PCP: Jobe Mulder, DO last seen 03/17/23 A1c: 7.5. on 03/17/23  Objective: AAO 3, NAD DP/PT pulses palpable, CRT less than 3 seconds Hyperkeratotic lesions present bilateral submetatarsal one and 5.  No underlying ulceration, drainage or any signs of infection.  Upon debridement of right submetatarsal 5 lesion some dried blood was present there is no ulcerations identified or signs of infection. Nails are minimally hypertrophic, dystrophic, brittle, discolored, elongated 10. No surrounding redness or drainage. Tenderness nails 1-5 bilaterally.  No pain otherwise the left foot in particular. No open lesions or pre-ulcerative lesions are identified. No pain with calf compression, swelling, warmth, erythema.  Assessment: Patient presents with symptomatic hyperkeratotic lesions;  type 2 diabetes with neuropathy, pain due to onychomycosis   Plan: -Treatment options including alternatives, risks, complications were discussed -Hyperkeratotic lesion sharply debrided x4 without any complications or bleeding -Mechanically debrided nails 1-5 bilateral with nail nipper without incident.  -Discussed daily foot inspection. If there are any changes, to call the office immediately.  -Follow-up in 9 weeks or sooner if any problems are to arise. In the meantime, encouraged to call the office with any questions, concerns, changes symptoms.   Jennefer Moats, DPM

## 2023-11-01 ENCOUNTER — Encounter: Payer: Self-pay | Admitting: Family Medicine

## 2023-11-15 ENCOUNTER — Encounter: Payer: Self-pay | Admitting: Family Medicine

## 2023-11-24 DIAGNOSIS — H47323 Drusen of optic disc, bilateral: Secondary | ICD-10-CM | POA: Diagnosis not present

## 2023-11-24 DIAGNOSIS — H59812 Chorioretinal scars after surgery for detachment, left eye: Secondary | ICD-10-CM | POA: Diagnosis not present

## 2023-11-24 DIAGNOSIS — H43391 Other vitreous opacities, right eye: Secondary | ICD-10-CM | POA: Diagnosis not present

## 2023-11-24 DIAGNOSIS — Z8669 Personal history of other diseases of the nervous system and sense organs: Secondary | ICD-10-CM | POA: Diagnosis not present

## 2023-11-24 DIAGNOSIS — H35341 Macular cyst, hole, or pseudohole, right eye: Secondary | ICD-10-CM | POA: Diagnosis not present

## 2023-11-24 DIAGNOSIS — H35373 Puckering of macula, bilateral: Secondary | ICD-10-CM | POA: Diagnosis not present

## 2023-12-05 ENCOUNTER — Other Ambulatory Visit: Payer: Self-pay | Admitting: Family Medicine

## 2023-12-14 ENCOUNTER — Encounter: Payer: Self-pay | Admitting: Family Medicine

## 2023-12-21 DIAGNOSIS — H5213 Myopia, bilateral: Secondary | ICD-10-CM | POA: Diagnosis not present

## 2023-12-21 DIAGNOSIS — H26491 Other secondary cataract, right eye: Secondary | ICD-10-CM | POA: Diagnosis not present

## 2023-12-21 DIAGNOSIS — H52223 Regular astigmatism, bilateral: Secondary | ICD-10-CM | POA: Diagnosis not present

## 2023-12-21 DIAGNOSIS — H47323 Drusen of optic disc, bilateral: Secondary | ICD-10-CM | POA: Diagnosis not present

## 2023-12-21 DIAGNOSIS — H524 Presbyopia: Secondary | ICD-10-CM | POA: Diagnosis not present

## 2023-12-21 DIAGNOSIS — H43813 Vitreous degeneration, bilateral: Secondary | ICD-10-CM | POA: Diagnosis not present

## 2023-12-21 DIAGNOSIS — H35341 Macular cyst, hole, or pseudohole, right eye: Secondary | ICD-10-CM | POA: Diagnosis not present

## 2023-12-30 ENCOUNTER — Ambulatory Visit (INDEPENDENT_AMBULATORY_CARE_PROVIDER_SITE_OTHER): Admitting: Podiatry

## 2023-12-30 DIAGNOSIS — Z91199 Patient's noncompliance with other medical treatment and regimen due to unspecified reason: Secondary | ICD-10-CM

## 2023-12-30 NOTE — Progress Notes (Signed)
 No show

## 2024-01-17 ENCOUNTER — Other Ambulatory Visit: Payer: Self-pay | Admitting: Family Medicine

## 2024-01-21 ENCOUNTER — Encounter: Payer: Self-pay | Admitting: Podiatry

## 2024-01-21 ENCOUNTER — Ambulatory Visit: Admitting: Podiatry

## 2024-01-21 DIAGNOSIS — L84 Corns and callosities: Secondary | ICD-10-CM

## 2024-01-21 DIAGNOSIS — M79674 Pain in right toe(s): Secondary | ICD-10-CM

## 2024-01-21 DIAGNOSIS — M79675 Pain in left toe(s): Secondary | ICD-10-CM

## 2024-01-21 DIAGNOSIS — B351 Tinea unguium: Secondary | ICD-10-CM | POA: Diagnosis not present

## 2024-01-21 DIAGNOSIS — E1149 Type 2 diabetes mellitus with other diabetic neurological complication: Secondary | ICD-10-CM | POA: Diagnosis not present

## 2024-01-21 NOTE — Progress Notes (Signed)
 Subjective: 78 y.o. returns the office today for painful calluses to his feet that he will have trimmed the right side worse than left.  He has not seen any redness or swelling. Denies any open sores.  Denies any systemic complaints such as fevers, chills, nausea, vomiting.   PCP: Frann Mabel Mt, DO last seen 03/17/23 A1c: 7.5. on 03/17/23  Objective: AAO 3, NAD DP/PT pulses palpable, CRT less than 3 seconds Hyperkeratotic lesions present bilateral submetatarsal one and 5.  No underlying ulceration, drainage or any signs of infection.  Upon debridement of right submetatarsal 5 lesion some dried blood was present there is no ulcerations identified or signs of infection. Nails are minimally hypertrophic, dystrophic, brittle, discolored, elongated 10. No surrounding redness or drainage. Tenderness nails 1-5 bilaterally.  No pain otherwise the left foot in particular. No open lesions or pre-ulcerative lesions are identified. No pain with calf compression, swelling, warmth, erythema.  Assessment: Patient presents with symptomatic hyperkeratotic lesions;  type 2 diabetes with neuropathy, pain due to onychomycosis   Plan: -Treatment options including alternatives, risks, complications were discussed -Hyperkeratotic lesion sharply debrided x4 without any complications or bleeding -Mechanically debrided nails 1-5 bilateral with nail nipper without incident.  -Discussed daily foot inspection. If there are any changes, to call the office immediately.  -Follow-up in 10 weeks or sooner if any problems are to arise. In the meantime, encouraged to call the office with any questions, concerns, changes symptoms.   Ronald Barr, DPM

## 2024-01-26 ENCOUNTER — Ambulatory Visit (INDEPENDENT_AMBULATORY_CARE_PROVIDER_SITE_OTHER): Admitting: Family Medicine

## 2024-01-26 ENCOUNTER — Ambulatory Visit: Payer: Self-pay | Admitting: Family Medicine

## 2024-01-26 ENCOUNTER — Encounter: Payer: Self-pay | Admitting: Family Medicine

## 2024-01-26 VITALS — BP 124/68 | HR 82 | Temp 97.7°F | Resp 16 | Ht >= 80 in | Wt 268.6 lb

## 2024-01-26 DIAGNOSIS — Z0000 Encounter for general adult medical examination without abnormal findings: Secondary | ICD-10-CM | POA: Diagnosis not present

## 2024-01-26 DIAGNOSIS — E119 Type 2 diabetes mellitus without complications: Secondary | ICD-10-CM

## 2024-01-26 DIAGNOSIS — E669 Obesity, unspecified: Secondary | ICD-10-CM

## 2024-01-26 DIAGNOSIS — Z23 Encounter for immunization: Secondary | ICD-10-CM

## 2024-01-26 DIAGNOSIS — E1169 Type 2 diabetes mellitus with other specified complication: Secondary | ICD-10-CM | POA: Diagnosis not present

## 2024-01-26 LAB — MICROALBUMIN / CREATININE URINE RATIO
Creatinine,U: 135 mg/dL
Microalb Creat Ratio: 15.2 mg/g (ref 0.0–30.0)
Microalb, Ur: 2.1 mg/dL — ABNORMAL HIGH (ref 0.0–1.9)

## 2024-01-26 LAB — COMPREHENSIVE METABOLIC PANEL WITH GFR
ALT: 19 U/L (ref 0–53)
AST: 26 U/L (ref 0–37)
Albumin: 4.2 g/dL (ref 3.5–5.2)
Alkaline Phosphatase: 32 U/L — ABNORMAL LOW (ref 39–117)
BUN: 23 mg/dL (ref 6–23)
CO2: 30 meq/L (ref 19–32)
Calcium: 9.7 mg/dL (ref 8.4–10.5)
Chloride: 106 meq/L (ref 96–112)
Creatinine, Ser: 1.32 mg/dL (ref 0.40–1.50)
GFR: 52.23 mL/min — ABNORMAL LOW (ref >60.00)
Glucose, Bld: 88 mg/dL (ref 70–99)
Potassium: 4.4 meq/L (ref 3.5–5.1)
Sodium: 141 meq/L (ref 135–145)
Total Bilirubin: 0.5 mg/dL (ref 0.2–1.2)
Total Protein: 6.6 g/dL (ref 6.0–8.3)

## 2024-01-26 LAB — CBC
HCT: 40 % (ref 39.0–52.0)
Hemoglobin: 13 g/dL (ref 13.0–17.0)
MCHC: 32.6 g/dL (ref 30.0–36.0)
MCV: 92.2 fl (ref 78.0–100.0)
Platelets: 205 K/uL (ref 150.0–400.0)
RBC: 4.34 Mil/uL (ref 4.22–5.81)
RDW: 15.3 % (ref 11.5–15.5)
WBC: 5.1 K/uL (ref 4.0–10.5)

## 2024-01-26 LAB — LIPID PANEL
Cholesterol: 160 mg/dL (ref 0–200)
HDL: 37.2 mg/dL — ABNORMAL LOW (ref >39.00)
LDL Cholesterol: 102 mg/dL — ABNORMAL HIGH (ref 0–99)
NonHDL: 122.99
Total CHOL/HDL Ratio: 4
Triglycerides: 104 mg/dL (ref 0.0–149.0)
VLDL: 20.8 mg/dL (ref 0.0–40.0)

## 2024-01-26 LAB — HEMOGLOBIN A1C: Hgb A1c MFr Bld: 7.2 % — ABNORMAL HIGH (ref 4.6–6.5)

## 2024-01-26 MED ORDER — METFORMIN HCL 850 MG PO TABS: 850.0000 mg | ORAL_TABLET | Freq: Two times a day (BID) | ORAL | Status: DC

## 2024-01-26 MED ORDER — TRESIBA FLEXTOUCH 100 UNIT/ML ~~LOC~~ SOPN: 20.0000 [IU] | PEN_INJECTOR | Freq: Every day | SUBCUTANEOUS | Status: DC

## 2024-01-26 NOTE — Patient Instructions (Addendum)
Give us 2-3 business days to get the results of your labs back.   Keep the diet clean and stay active.  Please get me a copy of your advanced directive form at your convenience.   The Shingrix vaccine (for shingles) is a 2 shot series spaced 2-6 months apart. It can make people feel low energy, achy and almost like they have the flu for 48 hours after injection. 1/5 people can have nausea and/or vomiting. Please plan accordingly when deciding on when to get this shot. Call your pharmacy to get this. The second shot of the series is less severe regarding the side effects, but it still lasts 48 hours.   Let us know if you need anything.  

## 2024-01-26 NOTE — Progress Notes (Signed)
 Chief Complaint  Patient presents with   Annual Exam    CPE    Well Male Ronald Barr is here for a complete physical.   His last physical was >1 year ago.  Current diet: in general, a healthy diet.   Current exercise: none Weight trend: Intentionally losing Fatigue out of ordinary? No. Seat belt? Yes.   Advanced directive? Yes  Health maintenance Shingrix- No Tetanus- Yes Hep C- Yes Pneumonia vaccine- Yes  Past Medical History:  Diagnosis Date   Cancer (HCC)    prostate   Diabetes mellitus without complication (HCC)    Essential hypertension 03/06/2016   GERD (gastroesophageal reflux disease)    Hyperlipidemia    Sleep apnea    sleeps with BPAP machine     Past Surgical History:  Procedure Laterality Date   nose and throat surgery     sleep apnes   SHOULDER SURGERY     TONSILLECTOMY      Medications  Current Outpatient Medications on File Prior to Visit  Medication Sig Dispense Refill   Accu-Chek Softclix Lancets lancets CHECK BLOOD SUGARS ONCE DAILY 100 each 3   amLODipine  (NORVASC ) 10 MG tablet TAKE 1 TABLET BY MOUTH DAILY 90 tablet 1   cyclobenzaprine  (FLEXERIL ) 10 MG tablet TAKE 1 TABLET BY MOUTH AT BEDTIME 90 tablet 2   enalapril  (VASOTEC ) 10 MG tablet TAKE 1 TABLET BY MOUTH DAILY 90 tablet 1   fenofibrate  (TRICOR ) 145 MG tablet TAKE 1 TABLET BY MOUTH DAILY 90 tablet 1   glipiZIDE  (GLUCOTROL  XL) 10 MG 24 hr tablet TAKE 2 TABLETS BY MOUTH DAILY 180 tablet 1   glucose blood (ACCU-CHEK GUIDE TEST) test strip Check blood sugar twice daily. DX code E11.69 100 each 12   Insulin  Pen Needle (DROPLET PEN NEEDLES) 32G X 6 MM MISC USE 1 NEEDLE 2 TIMES A DAY 200 each 0   IRON PO Take 1 tablet by mouth daily.     MAGNESIUM PO Take 1 tablet by mouth 2 (two) times daily at 10 AM and 5 PM.     Multiple Vitamins-Minerals (MULTIVITAMIN PO) Take 1 tablet by mouth daily.     omeprazole  (PRILOSEC) 20 MG capsule TAKE 1 CAPSULE BY MOUTH DAILY 90 capsule 3   OZEMPIC , 0.25  OR 0.5 MG/DOSE, 2 MG/3ML SOPN      pioglitazone  (ACTOS ) 30 MG tablet TAKE 1 TABLET BY MOUTH DAILY 90 tablet 1   pravastatin  (PRAVACHOL ) 10 MG tablet TAKE 1 TABLET BY MOUTH DAILY 90 tablet 1   valACYclovir  (VALTREX ) 1000 MG tablet Take 2 tabs and repeat in 12 hours in event of an outbreak. 30 tablet 1    Allergies Allergies  Allergen Reactions   Statins     Cramping, intolerant of Crestor and Pravastatin    Sulfa Antibiotics     Family History Family History  Problem Relation Age of Onset   Hypertension Mother    Hypertension Father     Review of Systems: Constitutional:  no fevers Eye:  no recent significant change in vision Ears:  No changes in hearing Nose/Mouth/Throat:  no complaints of nasal congestion, no sore throat Cardiovascular: no chest pain Respiratory:  No shortness of breath Gastrointestinal:  No change in bowel habits GU:  No frequency Integumentary:  no abnormal skin lesions reported Neurologic:  no headaches Endocrine:  denies unexplained weight changes  Exam BP 124/68 (BP Location: Left Arm, Patient Position: Sitting)   Pulse 82   Temp 97.7 F (36.5 C) (Oral)  Resp 16   Ht 6' 8 (2.032 m)   Wt 268 lb 9.6 oz (121.8 kg)   SpO2 98%   BMI 29.51 kg/m  General:  well developed, well nourished, in no apparent distress Skin:  no significant moles, warts, or growths Head:  no masses, lesions, or tenderness Eyes:  pupils equal and round, sclera anicteric without injection Ears:  canals without lesions, TMs shiny without retraction, no obvious effusion, no erythema Nose:  nares patent, mucosa normal Throat/Pharynx:  lips and gingiva without lesion; tongue and uvula midline; non-inflamed pharynx; no exudates or postnasal drainage Lungs:  clear to auscultation, breath sounds equal bilaterally, no respiratory distress Cardio:  regular rate and rhythm, no LE edema or bruits Rectal: Deferred GI: BS+, S, NT, ND, no masses or organomegaly Musculoskeletal:   symmetrical muscle groups noted without atrophy or deformity Neuro:  gait normal; deep tendon reflexes normal and symmetric Psych: well oriented with normal range of affect and appropriate judgment/insight  Assessment and Plan  Well adult exam  Type 2 diabetes mellitus with obesity (HCC) - Plan: Comprehensive metabolic panel with GFR, CBC, Hemoglobin A1c, Lipid panel, Microalbumin / creatinine urine ratio  Type 2 diabetes mellitus without complication, without long-term current use of insulin  (HCC) - Plan: metFORMIN  (GLUCOPHAGE ) 850 MG tablet   Well 78 y.o. male. Counseled on diet and exercise. Flu shot today.  Shingrix rec'd.  Advanced directive form requested today.  Other orders as above. Follow up in 6 mo.  The patient voiced understanding and agreement to the plan.  Mabel Mt [place], DO 01/26/24 8:47 AM

## 2024-02-03 ENCOUNTER — Other Ambulatory Visit: Payer: Self-pay | Admitting: Family Medicine

## 2024-02-03 DIAGNOSIS — E119 Type 2 diabetes mellitus without complications: Secondary | ICD-10-CM

## 2024-02-23 ENCOUNTER — Ambulatory Visit

## 2024-02-23 ENCOUNTER — Telehealth: Payer: Self-pay | Admitting: *Deleted

## 2024-02-23 NOTE — Telephone Encounter (Signed)
 Pt was scheduled for AWV today at 11am. No answer x 2. Left message to call and R/S. May place on wellness visit 1 schedule at [facility].

## 2024-02-28 ENCOUNTER — Encounter (HOSPITAL_BASED_OUTPATIENT_CLINIC_OR_DEPARTMENT_OTHER): Payer: Self-pay | Admitting: Emergency Medicine

## 2024-02-28 ENCOUNTER — Other Ambulatory Visit: Payer: Self-pay

## 2024-02-28 ENCOUNTER — Emergency Department (HOSPITAL_BASED_OUTPATIENT_CLINIC_OR_DEPARTMENT_OTHER)
Admission: EM | Admit: 2024-02-28 | Discharge: 2024-02-28 | Discharge status: Against Medical Advice | Attending: Emergency Medicine | Admitting: Emergency Medicine

## 2024-02-28 DIAGNOSIS — Z5321 Procedure and treatment not carried out due to patient leaving prior to being seen by health care provider: Secondary | ICD-10-CM | POA: Diagnosis not present

## 2024-02-28 DIAGNOSIS — R110 Nausea: Secondary | ICD-10-CM | POA: Diagnosis not present

## 2024-02-28 DIAGNOSIS — R220 Localized swelling, mass and lump, head: Secondary | ICD-10-CM | POA: Insufficient documentation

## 2024-02-28 DIAGNOSIS — R519 Headache, unspecified: Secondary | ICD-10-CM | POA: Diagnosis not present

## 2024-02-28 DIAGNOSIS — H53149 Visual discomfort, unspecified: Secondary | ICD-10-CM | POA: Insufficient documentation

## 2024-02-28 NOTE — ED Notes (Signed)
 Patient informed registration they were leaving. Noted to walk out of the department

## 2024-02-28 NOTE — ED Triage Notes (Signed)
 Pt c/o L sided headache x 3 days, worsening. - photophobia, + nausea yesterday.  Denies fever, cough.    Spouse in triage reportspt with  L facial swelling x 3 days. Denies difficulty chewing or swallowing.   Denies hx of migraines.

## 2024-03-04 ENCOUNTER — Other Ambulatory Visit: Payer: Self-pay | Admitting: Family Medicine

## 2024-03-08 DIAGNOSIS — X32XXXD Exposure to sunlight, subsequent encounter: Secondary | ICD-10-CM | POA: Diagnosis not present

## 2024-03-08 DIAGNOSIS — L820 Inflamed seborrheic keratosis: Secondary | ICD-10-CM | POA: Diagnosis not present

## 2024-03-08 DIAGNOSIS — L570 Actinic keratosis: Secondary | ICD-10-CM | POA: Diagnosis not present

## 2024-03-15 ENCOUNTER — Telehealth: Payer: Self-pay | Admitting: Family Medicine

## 2024-03-15 NOTE — Telephone Encounter (Signed)
 Copied from CRM [id number]. Topic: Medicare AWV >> Mar 15, 2024 11:48 AM Nathanel DEL wrote: Reason for CRM: Called LVM 03/15/2024 to schedule AWV. Please schedule office or virtual visits  [name]; Care Guide Ambulatory Clinical Support [place] [facility] Direct Dial: [phone]

## 2024-03-15 NOTE — Telephone Encounter (Signed)
 Copied from CRM #[id number]. Topic: Medicare AWV >> Mar 15, 2024 11:50 AM Nathanel DEL wrote: Reason for CRM: Called LVM 03/15/2024 to schedule AWV. Please schedule office or virtual visits  [name]; Care Guide Ambulatory Clinical Support [place] [facility] Direct Dial: [phone]

## 2024-03-23 ENCOUNTER — Other Ambulatory Visit: Payer: Self-pay | Admitting: Family Medicine

## 2024-03-23 DIAGNOSIS — E119 Type 2 diabetes mellitus without complications: Secondary | ICD-10-CM

## 2024-03-31 ENCOUNTER — Encounter: Payer: Self-pay | Admitting: Podiatry

## 2024-03-31 ENCOUNTER — Ambulatory Visit: Admitting: Podiatry

## 2024-03-31 DIAGNOSIS — B351 Tinea unguium: Secondary | ICD-10-CM | POA: Diagnosis not present

## 2024-03-31 DIAGNOSIS — E1149 Type 2 diabetes mellitus with other diabetic neurological complication: Secondary | ICD-10-CM | POA: Diagnosis not present

## 2024-03-31 DIAGNOSIS — M79674 Pain in right toe(s): Secondary | ICD-10-CM

## 2024-03-31 DIAGNOSIS — L84 Corns and callosities: Secondary | ICD-10-CM

## 2024-03-31 DIAGNOSIS — M79675 Pain in left toe(s): Secondary | ICD-10-CM | POA: Diagnosis not present

## 2024-03-31 NOTE — Progress Notes (Signed)
 Subjective: 78 y.o. returns the office today for painful calluses to his feet that he will have trimmed the right side worse than left.  He has not seen any redness or swelling. Denies any open sores.  Denies any systemic complaints such as fevers, chills, nausea, vomiting.   PCP: Frann Mabel Mt, DO last seen 03/17/23 A1c: 7.5. on 03/17/23  Objective: AAO 3, NAD DP/PT pulses palpable, CRT less than 3 seconds Hyperkeratotic lesions present bilateral submetatarsal one and 5.  No underlying ulceration, drainage or any signs of infection.  Upon debridement of right submetatarsal 5 lesion some dried blood was present there is no ulcerations identified or signs of infection. Nails are minimally hypertrophic, dystrophic, brittle, discolored, elongated 10. No surrounding redness or drainage. Tenderness nails 1-5 bilaterally.  No pain otherwise the left foot in particular. No open lesions or pre-ulcerative lesions are identified. No pain with calf compression, swelling, warmth, erythema.  Assessment: Patient presents with symptomatic hyperkeratotic lesions;  type 2 diabetes with neuropathy, pain due to onychomycosis   Plan: -Treatment options including alternatives, risks, complications were discussed -Hyperkeratotic lesion sharply debrided x4 without any complications or bleeding as courtesy.  Disucssed prevention techniques to take care of hyperkeratosis  -Mechanically debrided nails 1-5 bilateral with nail nipper without incident.  -Discussed daily foot inspection. If there are any changes, to call the office immediately.  -Follow-up in 10 weeks or sooner if any problems are to arise. In the meantime, encouraged to call the office with any questions, concerns, changes symptoms.   Asberry Failing, DPM

## 2024-05-25 ENCOUNTER — Other Ambulatory Visit: Payer: Self-pay

## 2024-05-25 ENCOUNTER — Encounter: Payer: Self-pay | Admitting: Family Medicine

## 2024-05-25 DIAGNOSIS — E119 Type 2 diabetes mellitus without complications: Secondary | ICD-10-CM

## 2024-05-25 MED ORDER — TRESIBA FLEXTOUCH 100 UNIT/ML ~~LOC~~ SOPN: 30.0000 [IU] | PEN_INJECTOR | Freq: Every day | SUBCUTANEOUS | 5 refills | Status: DC

## 2024-05-25 MED ORDER — TRESIBA FLEXTOUCH 100 UNIT/ML ~~LOC~~ SOPN: 30.0000 [IU] | PEN_INJECTOR | Freq: Every day | SUBCUTANEOUS | 1 refills | Status: AC

## 2024-06-03 ENCOUNTER — Other Ambulatory Visit: Payer: Self-pay | Admitting: Family Medicine

## 2024-06-09 ENCOUNTER — Encounter: Payer: Self-pay | Admitting: Podiatry

## 2024-06-09 ENCOUNTER — Ambulatory Visit: Admitting: Podiatry

## 2024-06-09 DIAGNOSIS — B351 Tinea unguium: Secondary | ICD-10-CM | POA: Diagnosis not present

## 2024-06-09 DIAGNOSIS — E1149 Type 2 diabetes mellitus with other diabetic neurological complication: Secondary | ICD-10-CM | POA: Diagnosis not present

## 2024-06-09 DIAGNOSIS — M79674 Pain in right toe(s): Secondary | ICD-10-CM | POA: Diagnosis not present

## 2024-06-09 DIAGNOSIS — L84 Corns and callosities: Secondary | ICD-10-CM

## 2024-06-09 DIAGNOSIS — M79675 Pain in left toe(s): Secondary | ICD-10-CM | POA: Diagnosis not present

## 2024-06-09 NOTE — Progress Notes (Signed)
 Subjective: 79 y.o. returns the office today for painful calluses to his feet that he will have trimmed the right side worse than left.  He has not seen any redness or swelling. Denies any open sores.  Denies any systemic complaints such as fevers, chills, nausea, vomiting.   PCP: Frann Mabel Mt, DO last seen 03/17/23 A1c: 7.5. on 03/17/23  Objective: AAO 3, NAD DP/PT pulses palpable, CRT less than 3 seconds Hyperkeratotic lesions present bilateral submetatarsal one and 5.  No underlying ulceration, drainage or any signs of infection.  Upon debridement of right submetatarsal 5 lesion some dried blood was present there is no ulcerations identified or signs of infection. Nails are minimally hypertrophic, dystrophic, brittle, discolored, elongated 10. No surrounding redness or drainage. Tenderness nails 1-5 bilaterally.  No pain otherwise the left foot in particular. No open lesions or pre-ulcerative lesions are identified. No pain with calf compression, swelling, warmth, erythema.  Assessment: Patient presents with symptomatic hyperkeratotic lesions;  type 2 diabetes with neuropathy, pain due to onychomycosis   Plan: -Treatment options including alternatives, risks, complications were discussed -Hyperkeratotic lesion sharply debrided x4 without any complications or bleeding as courtesy.  Disucssed prevention techniques to take care of hyperkeratosis  -Mechanically debrided nails 1-5 bilateral with nail nipper without incident.  -Discussed daily foot inspection. If there are any changes, to call the office immediately.  -Follow-up in 10 weeks or sooner if any problems are to arise. In the meantime, encouraged to call the office with any questions, concerns, changes symptoms.   Ronald Barr, DPM

## 2024-07-25 ENCOUNTER — Ambulatory Visit: Admitting: Family Medicine

## 2024-09-08 ENCOUNTER — Ambulatory Visit: Admitting: Podiatry

## 2025-01-26 ENCOUNTER — Encounter: Admitting: Family Medicine
